# Patient Record
Sex: Male | Born: 1945 | Race: White | Hispanic: No | Marital: Married | Smoking: Former smoker
Health system: Southern US, Community
[De-identification: ages and names within clinical notes are randomized; demographics above are authoritative.]

## PROBLEM LIST (undated history)

## (undated) DIAGNOSIS — Z973 Presence of spectacles and contact lenses: Secondary | ICD-10-CM

## (undated) DIAGNOSIS — D709 Neutropenia, unspecified: Principal | ICD-10-CM

## (undated) DIAGNOSIS — IMO0002 Reserved for concepts with insufficient information to code with codable children: Secondary | ICD-10-CM

## (undated) DIAGNOSIS — I219 Acute myocardial infarction, unspecified: Secondary | ICD-10-CM

## (undated) DIAGNOSIS — D472 Monoclonal gammopathy: Secondary | ICD-10-CM

## (undated) DIAGNOSIS — Z974 Presence of external hearing-aid: Secondary | ICD-10-CM

## (undated) DIAGNOSIS — I10 Essential (primary) hypertension: Secondary | ICD-10-CM

## (undated) DIAGNOSIS — E785 Hyperlipidemia, unspecified: Secondary | ICD-10-CM

## (undated) DIAGNOSIS — C8590 Non-Hodgkin lymphoma, unspecified, unspecified site: Secondary | ICD-10-CM

## (undated) DIAGNOSIS — C61 Malignant neoplasm of prostate: Secondary | ICD-10-CM

## (undated) DIAGNOSIS — D72819 Decreased white blood cell count, unspecified: Secondary | ICD-10-CM

## (undated) DIAGNOSIS — I2510 Atherosclerotic heart disease of native coronary artery without angina pectoris: Secondary | ICD-10-CM

## (undated) HISTORY — DX: Neutropenia, unspecified: D70.9

## (undated) HISTORY — PX: WISDOM TOOTH EXTRACTION: SHX21

## (undated) HISTORY — DX: Reserved for concepts with insufficient information to code with codable children: IMO0002

## (undated) HISTORY — PX: FINGER AMPUTATION: SHX636

## (undated) HISTORY — DX: Monoclonal gammopathy: D47.2

## (undated) HISTORY — DX: Hyperlipidemia, unspecified: E78.5

## (undated) HISTORY — PX: REVISION AMPUTATION OF FINGER: SHX2346

## (undated) HISTORY — DX: Decreased white blood cell count, unspecified: D72.819

## (undated) HISTORY — DX: Acute myocardial infarction, unspecified: I21.9

## (undated) HISTORY — PX: VASECTOMY: SHX75

---

## 2001-08-25 ENCOUNTER — Encounter: Payer: Self-pay | Admitting: Family Medicine

## 2001-08-25 ENCOUNTER — Ambulatory Visit (HOSPITAL_COMMUNITY): Admission: RE | Admit: 2001-08-25 | Discharge: 2001-08-25 | Payer: Self-pay | Admitting: Family Medicine

## 2001-09-06 ENCOUNTER — Encounter: Payer: Self-pay | Admitting: Emergency Medicine

## 2001-09-06 ENCOUNTER — Inpatient Hospital Stay (HOSPITAL_COMMUNITY): Admission: EM | Admit: 2001-09-06 | Discharge: 2001-09-11 | Payer: Self-pay | Admitting: Emergency Medicine

## 2001-09-06 DIAGNOSIS — I252 Old myocardial infarction: Secondary | ICD-10-CM

## 2001-09-06 DIAGNOSIS — Z955 Presence of coronary angioplasty implant and graft: Secondary | ICD-10-CM

## 2001-09-06 HISTORY — DX: Old myocardial infarction: I25.2

## 2001-09-06 HISTORY — PX: CARDIAC CATHETERIZATION: SHX172

## 2001-09-06 HISTORY — PX: CORONARY ANGIOPLASTY WITH STENT PLACEMENT: SHX49

## 2001-09-06 HISTORY — DX: Presence of coronary angioplasty implant and graft: Z95.5

## 2001-10-03 ENCOUNTER — Encounter: Admission: RE | Admit: 2001-10-03 | Discharge: 2002-01-01 | Payer: Self-pay | Admitting: Cardiology

## 2002-01-02 ENCOUNTER — Encounter (HOSPITAL_COMMUNITY): Admission: RE | Admit: 2002-01-02 | Discharge: 2002-02-17 | Payer: Self-pay | Admitting: Cardiology

## 2002-01-08 ENCOUNTER — Encounter: Payer: Self-pay | Admitting: Neurosurgery

## 2002-01-10 ENCOUNTER — Inpatient Hospital Stay (HOSPITAL_COMMUNITY): Admission: RE | Admit: 2002-01-10 | Discharge: 2002-01-10 | Payer: Self-pay | Admitting: Neurosurgery

## 2002-01-26 ENCOUNTER — Observation Stay (HOSPITAL_COMMUNITY): Admission: RE | Admit: 2002-01-26 | Discharge: 2002-01-27 | Payer: Self-pay | Admitting: Neurosurgery

## 2002-01-26 ENCOUNTER — Encounter: Payer: Self-pay | Admitting: Neurosurgery

## 2002-01-26 HISTORY — PX: ANTERIOR CERVICAL DISCECTOMY: SHX1160

## 2002-01-26 HISTORY — PX: ANTERIOR CERVICAL DECOMP/DISCECTOMY FUSION: SHX1161

## 2002-02-19 ENCOUNTER — Encounter (HOSPITAL_COMMUNITY): Admission: RE | Admit: 2002-02-19 | Discharge: 2002-05-20 | Payer: Self-pay | Admitting: Cardiology

## 2002-05-21 ENCOUNTER — Encounter (HOSPITAL_COMMUNITY): Admission: RE | Admit: 2002-05-21 | Discharge: 2002-08-19 | Payer: Self-pay | Admitting: Cardiology

## 2002-08-20 ENCOUNTER — Encounter (HOSPITAL_COMMUNITY): Admission: RE | Admit: 2002-08-20 | Discharge: 2002-10-20 | Payer: Self-pay | Admitting: Cardiology

## 2005-03-16 ENCOUNTER — Ambulatory Visit (HOSPITAL_COMMUNITY): Admission: RE | Admit: 2005-03-16 | Discharge: 2005-03-16 | Payer: Self-pay | Admitting: Gastroenterology

## 2008-12-06 ENCOUNTER — Ambulatory Visit: Payer: Self-pay | Admitting: Vascular Surgery

## 2008-12-06 ENCOUNTER — Emergency Department (HOSPITAL_COMMUNITY): Admission: EM | Admit: 2008-12-06 | Discharge: 2008-12-06 | Payer: Self-pay | Admitting: Emergency Medicine

## 2009-01-08 ENCOUNTER — Ambulatory Visit: Payer: Self-pay | Admitting: *Deleted

## 2009-01-08 ENCOUNTER — Ambulatory Visit (HOSPITAL_COMMUNITY): Admission: RE | Admit: 2009-01-08 | Discharge: 2009-01-08 | Payer: Self-pay | Admitting: *Deleted

## 2009-05-02 ENCOUNTER — Ambulatory Visit: Payer: Self-pay | Admitting: Oncology

## 2009-05-21 LAB — CBC WITH DIFFERENTIAL (CANCER CENTER ONLY)
BASO#: 0 10*3/uL (ref 0.0–0.2)
Eosinophils Absolute: 0.1 10*3/uL (ref 0.0–0.5)
HGB: 14.8 g/dL (ref 13.0–17.1)
LYMPH#: 0.8 10*3/uL — ABNORMAL LOW (ref 0.9–3.3)
MCH: 31.3 pg (ref 28.0–33.4)
MONO#: 0.4 10*3/uL (ref 0.1–0.9)
NEUT#: 1.6 10*3/uL (ref 1.5–6.5)
Platelets: 171 10*3/uL (ref 145–400)
RBC: 4.71 10*6/uL (ref 4.20–5.70)
WBC: 2.9 10*3/uL — ABNORMAL LOW (ref 4.0–10.0)

## 2009-05-21 LAB — CMP (CANCER CENTER ONLY)
ALT(SGPT): 31 U/L (ref 10–47)
AST: 37 U/L (ref 11–38)
Chloride: 100 mEq/L (ref 98–108)
Creat: 0.9 mg/dl (ref 0.6–1.2)
Total Bilirubin: 0.8 mg/dl (ref 0.20–1.60)

## 2009-05-21 LAB — MORPHOLOGY - CHCC SATELLITE
PLT EST ~~LOC~~: ADEQUATE
Platelet Morphology: NORMAL

## 2009-05-23 LAB — SPEP & IFE WITH QIG
Beta 2: 3.4 % (ref 3.2–6.5)
Beta Globulin: 5 % (ref 4.7–7.2)
IgA: 200 mg/dL (ref 68–378)
IgG (Immunoglobin G), Serum: 2460 mg/dL — ABNORMAL HIGH (ref 694–1618)

## 2009-05-23 LAB — KAPPA/LAMBDA LIGHT CHAINS: Kappa:Lambda Ratio: 1 (ref 0.26–1.65)

## 2009-05-23 LAB — LACTATE DEHYDROGENASE: LDH: 161 U/L (ref 94–250)

## 2009-05-27 LAB — UIFE/LIGHT CHAINS/TP QN, 24-HR UR
Alpha 1, Urine: DETECTED — AB
Alpha 2, Urine: DETECTED — AB
Free Kappa/Lambda Ratio: 12.13 ratio — ABNORMAL HIGH (ref 0.46–4.00)
Free Lambda Excretion/Day: 7.8 mg/d
Free Lt Chn Excr Rate: 94.6 mg/d
Total Protein, Urine-Ur/day: 114 mg/d (ref 10–140)
Total Protein, Urine: 5.7 mg/dL

## 2009-07-02 ENCOUNTER — Ambulatory Visit: Payer: Self-pay | Admitting: Oncology

## 2009-07-07 ENCOUNTER — Other Ambulatory Visit: Admission: RE | Admit: 2009-07-07 | Discharge: 2009-07-07 | Payer: Self-pay | Admitting: Oncology

## 2009-07-07 ENCOUNTER — Encounter: Payer: Self-pay | Admitting: Oncology

## 2009-07-07 LAB — CBC WITH DIFFERENTIAL (CANCER CENTER ONLY)
BASO#: 0 10*3/uL (ref 0.0–0.2)
BASO%: 0.4 % (ref 0.0–2.0)
EOS%: 4 % (ref 0.0–7.0)
HGB: 14.3 g/dL (ref 13.0–17.1)
LYMPH#: 0.6 10*3/uL — ABNORMAL LOW (ref 0.9–3.3)
MCHC: 35.7 g/dL (ref 32.0–35.9)
NEUT#: 1.7 10*3/uL (ref 1.5–6.5)
Platelets: 161 10*3/uL (ref 145–400)

## 2009-08-11 ENCOUNTER — Ambulatory Visit: Payer: Self-pay | Admitting: Oncology

## 2009-08-13 LAB — IRON AND TIBC
%SAT: 34 % (ref 20–55)
Iron: 103 ug/dL (ref 42–165)
TIBC: 302 ug/dL (ref 215–435)

## 2009-08-13 LAB — FERRITIN: Ferritin: 74 ng/mL (ref 22–322)

## 2010-08-28 ENCOUNTER — Ambulatory Visit: Payer: Self-pay | Admitting: Oncology

## 2010-09-03 LAB — RETICULOCYTES (CHCC)
ABS Retic: 46.2 10*3/uL (ref 19.0–186.0)
RBC.: 4.62 MIL/uL (ref 4.22–5.81)
Retic Ct Pct: 1 % (ref 0.4–3.1)

## 2010-09-03 LAB — CBC WITH DIFFERENTIAL (CANCER CENTER ONLY)
BASO#: 0 10*3/uL (ref 0.0–0.2)
BASO%: 0.6 % (ref 0.0–2.0)
EOS%: 2.6 % (ref 0.0–7.0)
Eosinophils Absolute: 0.1 10*3/uL (ref 0.0–0.5)
HCT: 42 % (ref 38.7–49.9)
HGB: 14.6 g/dL (ref 13.0–17.1)
LYMPH#: 0.6 10*3/uL — ABNORMAL LOW (ref 0.9–3.3)
LYMPH%: 20.4 % (ref 14.0–48.0)
MCH: 32.3 pg (ref 28.0–33.4)
MCHC: 34.7 g/dL (ref 32.0–35.9)
MCV: 93 fL (ref 82–98)
MONO#: 0.3 10*3/uL (ref 0.1–0.9)
MONO%: 10.7 % (ref 0.0–13.0)
NEUT#: 1.8 10*3/uL (ref 1.5–6.5)
NEUT%: 65.7 % (ref 40.0–80.0)
Platelets: 174 10*3/uL (ref 145–400)
RBC: 4.52 10*6/uL (ref 4.20–5.70)
RDW: 11.9 % (ref 10.5–14.6)
WBC: 2.8 10*3/uL — ABNORMAL LOW (ref 4.0–10.0)

## 2010-09-03 LAB — IRON AND TIBC
%SAT: 32 % (ref 20–55)
Iron: 91 ug/dL (ref 42–165)
TIBC: 283 ug/dL (ref 215–435)
UIBC: 192 ug/dL

## 2010-09-03 LAB — FERRITIN: Ferritin: 97 ng/mL (ref 22–322)

## 2010-10-14 ENCOUNTER — Ambulatory Visit: Payer: Self-pay | Admitting: Cardiology

## 2011-03-28 LAB — TISSUE HYBRIDIZATION (BONE MARROW)-NCBH

## 2011-04-05 LAB — POCT I-STAT, CHEM 8
BUN: 17 mg/dL (ref 6–23)
Calcium, Ion: 0.99 mmol/L — ABNORMAL LOW (ref 1.12–1.32)
Chloride: 107 mEq/L (ref 96–112)

## 2011-05-03 ENCOUNTER — Telehealth: Payer: Self-pay | Admitting: *Deleted

## 2011-05-03 NOTE — Telephone Encounter (Signed)
Pt called, c/o hives all over his torso and arms.  Pt feels like the Niaspan is causing the new symptoms.  Pt had hives and then stopped Niaspan due to running out of the medication and the hives went away, when pt started the Niaspan back the hives returned.  RN advised pt to stop Niaspan permanently and continue to work on diet and exercise.  RN informed pt to call back if symptoms do not improve.

## 2011-05-04 NOTE — Op Note (Signed)
NAMEDONNY, HEFFERN             ACCOUNT NO.:  [id number]   MEDICAL RECORD NO.:  [id number]          PATIENT TYPE:  AMB   LOCATION:  SDS                          FACILITY:  [facility]   PHYSICIAN:  Balinda Quails, M.D.    DATE OF BIRTH:  12-01-1946   DATE OF PROCEDURE:  01/08/2009  DATE OF DISCHARGE:                               OPERATIVE REPORT   PHYSICIAN:  Balinda Quails, MD   DIAGNOSIS:  Atheroemboli left foot.   PROCEDURE:  Abdominal aortogram with bilateral lower extremity runoff  arteriography.   ACCESS:  Right common femoral artery, 5-French sheath.   CONTRAST:  100 mL Visipaque.   COMPLICATIONS:  None apparent.   CLINICAL NOTE:  Eugene Moreno is a 65 year old male with a history of  coronary artery disease and a remote history of tobacco use.  He has had  a coronary stent placed in 2002.  Presented to the emergency department  with evidence of an atheroembolic episode to his left foot.  Brought to  the Cath Lab at this time for diagnostic workup with arteriography.   PROCEDURE NOTE:  The patient brought to the Cath Lab in stable  condition.  Placed in supine position.  Both groins prepped and draped  in a sterile fashion.   Skin and subcutaneous tissue of right groin was instilled with 1%  Xylocaine.  An 18-gauge needle introduced into the right common femoral  artery.  A 0.035 guidewire advanced through the needle into the mid  abdominal aorta.  A 5-French sheath advanced over the guidewire.   Pigtail catheter advanced over the guidewire to the suprarenal aorta.   Standard AP mid abdominal aortogram obtained.  This revealed normal  renal arteries, which were widely patent.  The infrarenal aorta was free  of significant atherosclerotic disease.  Widely patent.  The common  iliac arteries were normal without significant plaque.   The pigtail catheter brought down to the aortic bifurcation.  Bilateral  lower extremity runoff arteriography obtained.  The lower  extremities  revealed no evidence of significant atherosclerotic disease.  The  external iliacs, common femoral, profunda femoris, superficial femoral,  popliteal and tibial arteries were patent bilaterally.  No significant  atherosclerotic peripheral vascular disease identified.   This completed the arteriogram procedure.  The guidewire re-inserted.  Right femoral sheath removed.  No apparent complications.   FINAL IMPRESSION:  Normal abdominal aortogram with normal lower  extremity runoff arteriography.      Balinda Quails, M.D.  Electronically Signed     PGH/MEDQ  D:  01/08/2009  T:  01/08/2009  Job:  [id number]   cc:   Colleen Can. Deborah Chalk, M.D.  Larina Earthly, M.D.

## 2011-05-07 NOTE — Discharge Summary (Signed)
[place]. [facility]  Patient:    COYT, GOVONI Visit Number: [id number] MRN: [id number]          Service Type: MED Location: 2000 2038 01 Attending Physician:  Eleanora Neighbor Dictated by:   Jennet Maduro Earl Gala, R.N., A.N.P. Admit Date:  09/06/2001 Discharge Date: 09/11/2001   CC:         Chales Salmon. Abigail Miyamoto, M.D.  Dr. Jeral Fruit   Discharge Summary  PRIMARY DISCHARGE DIAGNOSIS:  Acute anterior myocardial infarction with emergent coronary angiography and subsequent stent placement with a 3.5 x 23 mm Penta stent to the left anterior descending.  SECONDARY DISCHARGE DIAGNOSIS: Herniated cervical disk.  HISTORY OF PRESENT ILLNESS:  The patient is a very pleasant 65 year old white male who has really had no past significant medical history.  He has recently suffered from a herniated cervical disk and has actually had surgery planned on Friday September 20.  He has been taking Tylenol and Valium for that discomfort.  He notes that on the night prior to admission he developed severe substernal chest pain at approximately 10 p.m. while taking a shower.  He took a dose of Tylox and Valium and drank some gingerale and thought the discomfort was more of an indegestion-like feeling, but it really provided no relief.  He subsequently became sweaty and clammy.  He proceeded onto the emergency room where a 12-lead electrocardiogram showed acute myocardial infarction.  He was subsequently admitted for further evaluation.  Please see the dictated history and physical per Dr. Roger Shelter for further patient presentation and profile.  LABORATORY DATA:  On admission, 12-lead electrocardiogram showing ST elevation in lead AVL as well as B2.  Chest x-ray was negative.  Hematocrit 43, white count 4.8.  Chemistries were satisfactory.  HOSPITAL COURSE:  The patient was admitted.  He was taken emergently to the coronary angiography lab to undergo cardiac  catheterization per Dr. Roger Shelter. The overall procedure was tolerated well without any known complications.  The left main was normal, The LAD demonstrated 100% proximal narrowing, and the LAD filled by partial collaterals via the right coronary. The left circumflex has a large obtuse marginal branch and is normal.  The right coronary artery is a large dominant vessel which gives right to left collaterals.  A 23 mm x 3.5 Penta stent was subsequently placed to the left anterior descending.  Excellent TIMI grade 3 flow was obtained with no residual stenosis.  He was placed on Plavix IV and Integrilin and subsequently placed to the coronary care unit for further patient presentation and profile.   Throughout the remainder of his hospitalization, he has progressed quite well. He has had no recurrence of chest pain.  His neck discomfort has been basically stable with p.r.n. Percocet.  His overall physical exam has been unremarkable.  He had a transient drop in his white blood cell count for uncertain reasons, but back up to 3.6 as of 09/08/01.  He has had cholesterol panels drawn which show a total cholesterol of 123, triglycerides 192, HDL 28, and LDL of 57.  Today on 09/11/01, he is doing well.  He has had no recurrence of chest pain. He is ambulating with cardiac rehab.  His neck pain has been basically stable and has actually diminished somewhat on a decreased use amount of narcotics. His physical exam is unremarkable, and he is felt to be a stable candidate for discharge today.  DISCHARGE CONDITION:  Stable.  DISCHARGE MEDICATIONS: 1. Plavix 75 mg  daily for the next 3 weeks. 2. Aspirin daily to be taken along with the Plavix. 3. Altace 2.5 mg daily. 4. Percocet as needed with prescription provided by Dr. Roger Shelter for    #40. 5. Nitroglycerin p.r.n. chest pain. 6. He may resume his vitamins as taken before.  ACTIVITY:  To be light.  He may walk 5 to 10 minutes two  times a day.  DIET:  Low fat.  WOUND CARE:  He is to place an ice pack to the groin if needed.  SPECIAL INSTRUCTIONS:  He is not to engage in any type of sexual intercourse until seen back in follow up.  He was also asked not to drive.  FOLLOW UP APPOINTMENT:  Needs to be in our office in approximately 10 days with the Nurse Practitioner.  He is to call to schedule that later on this afternoon.  We have also asked him to follow up with Dr. Jeral Fruit in the next 10 days. Dictated by:   Jennet Maduro Earl Gala, R.N., A.N.P. Attending Physician:  Eleanora Neighbor DD:  09/11/01 TD:  09/11/01 Job: [id number]

## 2011-05-07 NOTE — H&P (Signed)
[place]. [facility]  Patient:    YOUNG, MULVEY Visit Number: [id number] MRN: [id number]          Service Type: MED Location: [facility] 2922 01 Attending Physician:  Eleanora Neighbor Dictated by:   Colleen Can. Deborah Chalk, M.D. Admit Date:  09/06/2001   CC:         Chales Salmon. Abigail Miyamoto, M.D.  Tanya Nones. Jeral Fruit, M.D.   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  He is married, a nonsmoker, and rarely uses alcohol.  CHIEF COMPLAINT/HISTORY OF PRESENT ILLNESS: He developed severe substernal chest pain at approximately 10 p.m. tonight while taking a shower.  He has been having cervical neck problems and took Tylox and Valium.  He drank some ginger ale.  He thought the discomfort was an indigestion like feeling but it really provided no relief.  He later became somewhat sweaty and clammy but not really short of breath throughout the whole ordeal.  EMS was called at approximately midnight.  He has had no pain that has really radiated. Initially it was somewhat a burning mid chest pain and then began to radiate more to just left of his sternum.  It did not radiate to his arm.  He currently rated the pain as 7/10.  FAMILY HISTORY: Father had his first myocardial infarction at age 105.  He had two more myocardial infarctions before dying at age 31.  His mother had no history of heart trouble and died at age 76 of breast cancer.  He has five siblings.  He is second in the birth order and all other siblings are basically alive and well.  PAST MEDICAL HISTORY:  1. Remarkable only for herniated disk in his neck and he is actually     scheduled for surgery in two days and has been off chronic aspirin that he     takes.  2. Surgery on a wisdom tooth.  3. Traumatic amputation of little finger.  4. Vasectomy.  ALLERGIES: None.  CURRENT MEDICATIONS:  1. Aspirin.  2. Currently on Tylox.  3.  Valium.  REVIEW OF SYSTEMS: HEENT: Unremarkable except for his neck problems.  He had a similar episode to this initial event approximately a week ago but had syncope and was seen and evaluated.  He actually had carotid Dopplers, which were negative.  GI/GU/MUSCULOSKELETAL: All basically unremarkable.  There is a history of autoimmune disorder in the family although the patient has not had any of that.  His father had Dresslers syndrome with his first myocardial infarction.  PHYSICAL EXAMINATION:  GENERAL: He is a pleasant white male.  He is obese.  VITAL SIGNS: Blood pressure 110/70, heart rate 80s.  HEENT: Negative.  CHEST: Lungs clear.  HEART: No gallop.  ABDOMEN: Soft, somewhat obese.  No organomegaly.  No masses.  LYMPH NODES: Not palpable in the neck or groin.  EXTREMITIES: Peripheral pulses intact.  Extremities were without edema.  There is traumatic amputation of the little finger.  SKIN: Warm and dry.  Color somewhat pale.  LABORATORY DATA: EKG showed ST elevation in aVL and V2 compatible with lateral myocardial infarction.  Chest x-ray negative.  He did have abnormal cardiac enzymes.  BUN was 18, potassium 3.7.  Hematocrit 45.  Other laboratories pending.  OVERALL IMPRESSION: Acute anterior myocardial infarction.  PLAN: Proceed on with emergent catheterization. Dictated by:   Colleen Can Deborah Chalk, M.D. Attending Physician:  Eleanora Neighbor DD:  09/06/01 TD:  09/06/01 Job: [id number]

## 2011-05-07 NOTE — Op Note (Signed)
[place]. [facility]  Patient:    GLEASON, ARDOIN Visit Number: [id number] MRN: [id number]          Service Type: SUR Location: 3000 3027 01 Attending Physician:  Danella Penton Dictated by:   Tanya Nones. Jeral Fruit, M.D. Proc. Date: 01/26/02 Admit Date:  01/26/2002 Discharge Date: 01/27/2002   CC:         Laurita Quint, M.D. 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  PROCEDURE:  Anterior C6-7 diskectomy, decompression of the C7 nerve root, bone graft, plate.  Microscope.  SURGEON:  Tanya Nones. Jeral Fruit, M.D.  ASSISTANTMena Goes. Franky Macho, M.D.  CLINICAL HISTORY:  Mr. Carden is a 65 year old gentleman who was seen by me back in September 2002, because of neck and right upper extremity pain.  At that time he had weakness of the right triceps, with atrophy.  Unfortunately, he had an MI which was taken care of.  Now he comes back to have his surgery. We scheduled him for two weeks ago but his white cells were below 2.5.  He has been seen since then by the hematology which gave him the green light for surgery.  The risks were explained to him on history and physical.  DESCRIPTION OF PROCEDURE:  The patient was taken to the OR and the left side of the neck was prepped with Betadine.  Transverse incision was made and dissection was carried out all the way down to the cervical spine.  X-ray showed that, indeed, we were at the level of C6 and C7.  From then on, we removed the anterior osteophyte and the anterior ligament was opened.  We brought the microscope into the area and with the microcuret, we did a total gross diskectomy.  With a drill, without drilling the end plate, especially on the right side, we found that he has also spondylosis but once the spondylosis was removed, we found that he has some fragment of disk  compromising the C7 nerve root.  This C7 nerve root was a little bit pale secondary to the chronic compression.  Foraminotomy also on the left side was done.  Then the end plate was drilled and a bone graft of 8 mm height was inserted.  This was followed by a plate.  Lateral C-spine showed good position on the bone graft.  From then on, the area was irrigated. Investigation was negative and the wound was closed with Vicryl and a Steri-Strip. Dictated by:   Tanya Nones. Jeral Fruit, M.D. Attending Physician:  Danella Penton DD:  01/26/02 TD:  01/27/02 Job: [id number]

## 2011-05-07 NOTE — Cardiovascular Report (Signed)
[place]. [facility]  Patient:    Eugene Moreno, Eugene Moreno Visit Number: [id number] MRN: [id number]          Service Type: Attending:  Colleen Can. Deborah Chalk, M.D. Dictated by:   Colleen Can Deborah Chalk, M.D. Proc. Date: 09/06/01   CC:         Chales Salmon. Abigail Miyamoto, M.D.  Tanya Nones. Jeral Fruit, M.D.   Cardiac Catheterization  INDICATIONS: The patient is a 65 year old male, who presents with approximately 3-4 hours of substernal chest pain. It began with an indigestion-like feeling and became a steady pain just to the left of his sthernum. He had mild nausea. He also has a ruptured cervical disk over te last two weeks.  ECG showed lateral ST elevation and right bundle-branch block.  PROCEDURE: Left heart catheterization with selective coronary angiography, left ventricular angiography, and angioplasty of the left anterior descending with a stent placed in the proximal left anterior descending.  TYPE AND SITE OF ENTRY: Percutaneous right femoral artery.  CATHETERS: A 6 French 4 curved Judkins right and left coronary catheters, 6 French pigtail ventriculographic catheter, 7 [place] guide catheter, a Hi-Torque Floppy guide wire, initially a 3.0 x 23 mm Maverick balloon and subsequently a 23 x 3.5 mm Penta stent.  CONTRAST MATERIAL: Omnipaque.  MEDICATIONS GIVEN PRIOR TO THE PROCEDURE: Heparin and IV nitroglycerin.  MEDICATIONS GIVEN DURING THE PROCEDURE: Integrilin, fentanyl 25 mcg, and 2 mg of Versed.  COMMENTS: The patient tolerated the procedure well.  HEMODYNAMIC DATA: The aortic pressure was 103/72 and LV is 100/27.  There was no aortic valve gradient noted on pullback. The LV gram was done after the angioplasty procedure.  ANGIOGRAPHIC DATA: 1. Left main coronary artery:  Left main coronary artery is normal. 2. Intermediate coronary. There is a small intermediate coronary    artery which is essentially normal. 3. Left circumflex:  The left circumflex is a  reasonably large    obtuse marginal configuration. It is normal. 4. Right coronary artery: The right coronary artery is a dominant vessel.    It is large.  It supplies collaterals to the left anterior descending. 5. Left anterior descending: The left anterior descending is totally occluded   approximately 1.5 cm from the ostium.  LEFT VENTRICULOGRAPHY: The left ventricular angiogram was performed after the percutaneous intervention.  The left ventricular angiogram showed a overall reasonably well preserved global left ventricular function.  There was akinesis of the anterior wall including the apex.  The LV had somewhat of a slipper-type appearance.  There is no mitral regurgitation, intracardiac calcification or intracavitary filling defect.  PERCUTANEOUS INTERVENTION: We exchanged for a 7 [place] JL4 guide. The Hi-Torque Floppy guide wire was positioned across the occluded vessel. Initially, crossed with a 3.0 x 20 mm length Maverick balloon and inflated to a maximum of 5 atmospheres.  This resulted in satisfactory reperfusion. There is long irregular segment.  Then returned with a 3.5 x 20 mm Penta stent. This was inflated to a maximum of 9 atmospheres initially.  The initial angiographic evaluation suggested some distal narrowing and we returned with a balloon and inflated distally to 12 atmospheres resulting in excellent flow. The left anterior descending had a large second diagonal branch, but the left anterior descending itself ended prior to the apex.  The final angiographic result was felt to be excellent.  OVERALL IMPRESSION: 1. Acute anterior myocardial infarction. 2. Totally occluded left anterior descending. 3. Minimal coronary atherosclerosis otherwise with a large dominant    right coronary  artery. 4. Successful stent placement in the proximal left anterior descending with    TIMI grade 3 flow. Dictated by:   Colleen Can Deborah Chalk, M.D. Attending:  Colleen Can. Deborah Chalk,  M.D. DD:  09/06/01 TD:  09/06/01 Job: [id number]

## 2011-05-07 NOTE — Op Note (Signed)
NAMEKURT, HOFFMEIER NO.:  [id number]   MEDICAL RECORD NO.:  [id number]          PATIENT TYPE:  AMB   LOCATION:  ENDO                         FACILITY:  [facility]   PHYSICIAN:  Graylin Shiver, M.D.   DATE OF BIRTH:  03/04/1946   DATE OF PROCEDURE:  03/16/2005  DATE OF DISCHARGE:                                 OPERATIVE REPORT   PROCEDURE:  Colonoscopy.   INDICATIONS:  Screening.   Informed consent was obtained after explanation of the risks of bleeding,  infection, and perforation.   PREMEDICATIONS:  Fentanyl 70 mcg IV, Versed 7 mg IV.   PROCEDURE:  With the patient in the left lateral decubitus position, a  rectal exam was performed.  No masses were felt.  The Olympus colonoscope  was inserted into the rectum and advanced around the colon to the cecum.  Cecal landmarks were identified.  The cecum and ascending colon were normal.  The transverse colon normal.  The descending colon and sigmoid revealed  diverticulosis.  The rectum was normal.  He tolerated the procedure well  without complications.   IMPRESSION:  Diverticulosis.   PLAN:  I would recommend a follow-up screening colonoscopy again in 10  years.      SFG/MEDQ  D:  03/16/2005  T:  03/16/2005  Job:  [id number]   cc:   Chales Salmon. Abigail Miyamoto, M.D.  [address]  [place]  [place] [id number]  Fax: [phone]

## 2011-05-07 NOTE — H&P (Signed)
[place]. [facility]  Patient:    Eugene Moreno, Eugene Moreno Visit Number: [id number] MRN: [id number]          Service Type: SUR Location: [facility] 3172 04 Attending Physician:  Danella Penton Dictated by:   Tanya Nones. Jeral Fruit, M.D. Admit Date:  01/26/2002                           History and Physical  CHIEF COMPLAINT: This is a gentleman who was seen by me initially about five months ago because of neck pain with radiation down to the right shoulder and then to the right arm, associated with weakness and atrophy of the right triceps.  HISTORY OF PRESENT ILLNESS: The patient was treated with conservative treatment, and he was scheduled to have surgery.  Unfortunately, he had a myocardial infarction.  Now, he is cardiovascularly stable and he is ready to be taken to surgery.  We brought him to have surgery several weeks ago, but we found that his WBC was below 2.  Since then he had been seen by hematology, who feels that he is ready to go ahead with surgery, and he is being admitted today for anterior cervical diskectomy at the level of C6-C7.  PAST MEDICAL HISTORY:  1. History of oral surgery.  2. History of MI.  SOCIAL HISTORY: The patient does not smoke.  He drinks socially.  FAMILY HISTORY: Unremarkable.  REVIEW OF SYSTEMS: Positive for neck pain, arm pain.  PHYSICAL EXAMINATION:  GENERAL: The patient came to my office and, indeed, he has no pain but quite a bit of atrophy and weakness of the right triceps and right pectoralis major.  HEENT: Normal.  NECK: He is able to flex and extend with minimal discomfort.  CHEST: Lungs clear.  CARDIAC: Heart sounds normal.  ABDOMEN: Normal.  EXTREMITIES: Normal pulses.  NEUROLOGIC: Mental status normal.  Cranial nerves normal.  Strength 5/5 except in the right triceps, which is 1/5, with atrophy of the right triceps and the right pectoralis major.  Reflexes present with absent right  triceps. Coordination normal.  Sensation normal.  LABORATORY DATA: MRI shows herniated disk at the level of C6-C7.  CLINICAL IMPRESSION: C6-C7 herniated disk.  PLAN: The patient is being admitted for anterior cervical diskectomy at the level of C6-C7 followed with a bone graft and plate.  He knows about the risks such as infection, CSF leak, worsening of pain, paralysis, damage to organs, and all the risks associated with his history of MI. Dictated by:   Tanya Nones. Jeral Fruit, M.D. Attending Physician:  Danella Penton DD:  01/26/02 TD:  01/26/02 Job: [phone]

## 2011-09-16 ENCOUNTER — Other Ambulatory Visit: Payer: Self-pay | Admitting: Oncology

## 2011-09-16 ENCOUNTER — Encounter (HOSPITAL_BASED_OUTPATIENT_CLINIC_OR_DEPARTMENT_OTHER): Payer: 59 | Admitting: Oncology

## 2011-09-16 DIAGNOSIS — D72810 Lymphocytopenia: Secondary | ICD-10-CM

## 2011-09-16 DIAGNOSIS — D72819 Decreased white blood cell count, unspecified: Secondary | ICD-10-CM

## 2011-09-16 DIAGNOSIS — D472 Monoclonal gammopathy: Secondary | ICD-10-CM

## 2011-09-16 LAB — CBC WITH DIFFERENTIAL/PLATELET
Basophils Absolute: 0 10*3/uL (ref 0.0–0.1)
Eosinophils Absolute: 0.1 10*3/uL (ref 0.0–0.5)
HCT: 43 % (ref 38.4–49.9)
HGB: 15 g/dL (ref 13.0–17.1)
LYMPH%: 22.3 % (ref 14.0–49.0)
MCV: 92.7 fL (ref 79.3–98.0)
MONO#: 0.5 10*3/uL (ref 0.1–0.9)
MONO%: 16.1 % — ABNORMAL HIGH (ref 0.0–14.0)
NEUT#: 1.9 10*3/uL (ref 1.5–6.5)
Platelets: 155 10*3/uL (ref 140–400)
WBC: 3.2 10*3/uL — ABNORMAL LOW (ref 4.0–10.3)

## 2011-09-23 LAB — SPEP & IFE WITH QIG
Alpha-1-Globulin: 3.7 % (ref 2.9–4.9)
Alpha-2-Globulin: 8.5 % (ref 7.1–11.8)
Beta Globulin: 5.1 % (ref 4.7–7.2)
Gamma Globulin: 26.5 % — ABNORMAL HIGH (ref 11.1–18.8)
IgG (Immunoglobin G), Serum: 2760 mg/dL — ABNORMAL HIGH (ref 650–1600)

## 2011-09-23 LAB — COMPREHENSIVE METABOLIC PANEL
Albumin: 4.4 g/dL (ref 3.5–5.2)
BUN: 13 mg/dL (ref 6–23)
CO2: 25 mEq/L (ref 19–32)
Glucose, Bld: 66 mg/dL — ABNORMAL LOW (ref 70–99)
Sodium: 140 mEq/L (ref 135–145)
Total Bilirubin: 0.5 mg/dL (ref 0.3–1.2)
Total Protein: 8.1 g/dL (ref 6.0–8.3)

## 2011-09-23 LAB — KAPPA/LAMBDA LIGHT CHAINS
Kappa free light chain: 2.86 mg/dL — ABNORMAL HIGH (ref 0.33–1.94)
Kappa:Lambda Ratio: 1.08 (ref 0.26–1.65)
Lambda Free Lght Chn: 2.65 mg/dL — ABNORMAL HIGH (ref 0.57–2.63)

## 2011-09-23 LAB — SEDIMENTATION RATE: Sed Rate: 11 mm/hr (ref 0–16)

## 2011-10-12 ENCOUNTER — Encounter: Payer: Self-pay | Admitting: *Deleted

## 2011-10-18 ENCOUNTER — Encounter: Payer: Self-pay | Admitting: *Deleted

## 2011-10-18 ENCOUNTER — Ambulatory Visit (INDEPENDENT_AMBULATORY_CARE_PROVIDER_SITE_OTHER): Payer: 59 | Admitting: Cardiology

## 2011-10-18 DIAGNOSIS — E7800 Pure hypercholesterolemia, unspecified: Secondary | ICD-10-CM

## 2011-10-18 DIAGNOSIS — I2510 Atherosclerotic heart disease of native coronary artery without angina pectoris: Secondary | ICD-10-CM | POA: Insufficient documentation

## 2011-10-18 DIAGNOSIS — E785 Hyperlipidemia, unspecified: Secondary | ICD-10-CM

## 2011-10-18 MED ORDER — RAMIPRIL 2.5 MG PO CAPS: 2.5000 mg | ORAL_CAPSULE | Freq: Every day | ORAL | Status: DC

## 2011-10-18 NOTE — Assessment & Plan Note (Signed)
Check lipids/LFTs with goal LDL < 70.  

## 2011-10-18 NOTE — Progress Notes (Signed)
PCP: Dr. Zachery Dauer  65 yo with history of CAD s/p anterior MI in 9/02 presents for cardiology evaluation.  He has been seen by Dr. Deborah Chalk in the past and is seen by me for the first time today.  Last nuclear stress test in 2010 showed no evidence for ischemia. He has been doing well symptomatically.  He walks for 4 miles/day on his treadmill.  No exertional dyspnea or chest pain. Symptom with his MI was very severe "indigestion."  He was on Niaspan in the past but was unable to tolerate it.    ECG: NSR at 53, old ASMI, LAFB  PMH: 1. CAD: Anterior MI in 9/02 with PCI to LAD.  Cardiolite in 2010 with EF 54%, minimal anterior hypokinesis, no ischemia.  2. 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  4. Hyperlipidemia 5. 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  Nonsmoker.  Rare ETOH.  Married, lives in [place].   FH: Father with MI at 71, sister with angioplasty at 31.   ROS: All systems reviewed and negative except as per HPI.   Current Outpatient Prescriptions  Medication Sig Dispense Refill  . cholecalciferol (VITAMIN D) 400 UNITS TABS Take 400 Units by mouth daily.        Marland Kitchen Cod Liver Oil CAPS Take by mouth daily.        . fexofenadine (ALLEGRA) 180 MG tablet Take 180 mg by mouth daily.        . folic acid (FOLVITE) 800 MCG tablet Take 400 mcg by mouth daily.        . rosuvastatin (CRESTOR) 5 MG tablet Take 5 mg by mouth daily.        . vitamin C (ASCORBIC ACID) 500 MG tablet Take 500 mg by mouth daily.        Marland Kitchen DISCONTD: aspirin 325 MG tablet Take 325 mg by mouth daily.        Marland Kitchen aspirin EC 81 MG tablet Take 2 tablets (162 mg total) by mouth daily.      . ramipril (ALTACE) 2.5 MG capsule Take 1 capsule (2.5 mg total) by mouth daily.  90 capsule  3    BP 128/84  Pulse 55  Ht 5\' 11"  (1.803 m)  Wt 194 lb (87.998 kg)  BMI 27.06 kg/m2 General: NAD Neck: No JVD, no thyromegaly or thyroid nodule.  Lungs: Clear to  auscultation bilaterally with normal respiratory effort. CV: Nondisplaced PMI.  Heart regular S1/S2, no S3/S4, no murmur.  No peripheral edema.  No carotid bruit.  Normal pedal pulses.  Abdomen: Soft, nontender, no hepatosplenomegaly, no distention.  Neurologic: Alert and oriented x 3.  Psych: Normal affect. Extremities: No clubbing or cyanosis.

## 2011-10-18 NOTE — Patient Instructions (Signed)
Start ramipril 2.5mg  daily.  Decrease aspirin to 162mg  daily--this will be two 81mg  aspirin daily. It should be enteric coated.  Your physician recommends that you return for a FASTING lipid profile / BMET 414.01 272.0 in about 3 weeks.   Your physician recommends that you return for a FASTING lipid profile /liver profile 414.01  272.0 ---6 months after the lab in about 3 weeks.   Your physician wants you to follow-up in: 1 year with Dr Shirlee Latch. (October 2013). You should receive a letter about 2 months in advance. Call our office at [phone] if you do not get a letter.

## 2011-10-18 NOTE — Assessment & Plan Note (Signed)
Stable with no ischemic symptoms.  He will continue Crestor.  Decrease ASA to 162 mg daily.  He will start ramipril 2.5 mg daily for secondary prevention.  BMET in 2 weeks.

## 2011-10-19 NOTE — Progress Notes (Signed)
Addended by: Micki Riley C on: 10/19/2011 03:57 PM   Modules accepted: Orders

## 2011-10-25 ENCOUNTER — Telehealth: Payer: Self-pay | Admitting: Cardiology

## 2011-10-25 MED ORDER — ROSUVASTATIN CALCIUM 5 MG PO TABS: 5.0000 mg | ORAL_TABLET | Freq: Every day | ORAL | Status: DC

## 2011-10-25 NOTE — Telephone Encounter (Signed)
Refill crestor 5 mg, uses [facility] 90 day supply

## 2011-10-26 LAB — COMPREHENSIVE METABOLIC PANEL
AST: 28 U/L (ref 0–37)
Albumin: 4.4 g/dL (ref 3.5–5.2)
BUN: 13 mg/dL (ref 6–23)
Calcium: 9.5 mg/dL (ref 8.4–10.5)
Chloride: 106 mEq/L (ref 96–112)
Glucose, Bld: 66 mg/dL — ABNORMAL LOW (ref 70–99)
Potassium: 4.6 mEq/L (ref 3.5–5.3)
Sodium: 140 mEq/L (ref 135–145)
Total Protein: 8.1 g/dL (ref 6.0–8.3)

## 2011-11-08 ENCOUNTER — Other Ambulatory Visit: Payer: 59 | Admitting: *Deleted

## 2011-11-17 ENCOUNTER — Other Ambulatory Visit (INDEPENDENT_AMBULATORY_CARE_PROVIDER_SITE_OTHER): Payer: 59 | Admitting: *Deleted

## 2011-11-17 DIAGNOSIS — E7800 Pure hypercholesterolemia, unspecified: Secondary | ICD-10-CM

## 2011-11-17 DIAGNOSIS — I2510 Atherosclerotic heart disease of native coronary artery without angina pectoris: Secondary | ICD-10-CM

## 2011-11-17 LAB — HEPATIC FUNCTION PANEL
AST: 35 U/L (ref 0–37)
Albumin: 4.5 g/dL (ref 3.5–5.2)
Alkaline Phosphatase: 69 U/L (ref 39–117)
Total Protein: 8.6 g/dL — ABNORMAL HIGH (ref 6.0–8.3)

## 2011-11-17 LAB — BASIC METABOLIC PANEL
BUN: 16 mg/dL (ref 6–23)
CO2: 26 mEq/L (ref 19–32)
Chloride: 103 mEq/L (ref 96–112)
Creatinine, Ser: 1 mg/dL (ref 0.4–1.5)
Potassium: 4.4 mEq/L (ref 3.5–5.1)

## 2011-11-17 LAB — LIPID PANEL
Cholesterol: 109 mg/dL (ref 0–200)
HDL: 37.7 mg/dL — ABNORMAL LOW (ref >39.00)
Triglycerides: 61 mg/dL (ref 0.0–149.0)

## 2011-11-23 ENCOUNTER — Telehealth: Payer: Self-pay | Admitting: *Deleted

## 2011-11-23 NOTE — Telephone Encounter (Signed)
left voice message to inform the patient of the new date and time for his appointment on 09-14-2012 starting at 9:30am

## 2012-05-05 ENCOUNTER — Other Ambulatory Visit: Payer: 59

## 2012-06-08 ENCOUNTER — Ambulatory Visit (INDEPENDENT_AMBULATORY_CARE_PROVIDER_SITE_OTHER): Payer: Medicare Other | Admitting: *Deleted

## 2012-06-08 DIAGNOSIS — E785 Hyperlipidemia, unspecified: Secondary | ICD-10-CM

## 2012-06-08 LAB — HEPATIC FUNCTION PANEL
Albumin: 4.3 g/dL (ref 3.5–5.2)
Alkaline Phosphatase: 68 U/L (ref 39–117)
Bilirubin, Direct: 0.1 mg/dL (ref 0.0–0.3)
Total Protein: 8.2 g/dL (ref 6.0–8.3)

## 2012-06-08 LAB — LIPID PANEL
Cholesterol: 99 mg/dL (ref 0–200)
LDL Cholesterol: 45 mg/dL (ref 0–99)
Total CHOL/HDL Ratio: 3
Triglycerides: 99 mg/dL (ref 0.0–149.0)
VLDL: 19.8 mg/dL (ref 0.0–40.0)

## 2012-08-30 ENCOUNTER — Telehealth: Payer: Self-pay | Admitting: Cardiology

## 2012-08-30 MED ORDER — ROSUVASTATIN CALCIUM 5 MG PO TABS: 5.0000 mg | ORAL_TABLET | Freq: Every day | ORAL | Status: DC

## 2012-08-30 MED ORDER — RAMIPRIL 2.5 MG PO CAPS: 2.5000 mg | ORAL_CAPSULE | Freq: Every day | ORAL | Status: DC

## 2012-08-30 NOTE — Telephone Encounter (Signed)
Pt requesting refills of medication before going on a trip 10/03/12. Appt scheduled for pt with Dr Shirlee Latch 09/27/12.

## 2012-08-30 NOTE — Telephone Encounter (Signed)
Please return call to patient to discuss medication and extended vacation, he can be reached at [phone]

## 2012-09-13 ENCOUNTER — Other Ambulatory Visit: Payer: Self-pay | Admitting: Medical Oncology

## 2012-09-13 DIAGNOSIS — I2510 Atherosclerotic heart disease of native coronary artery without angina pectoris: Secondary | ICD-10-CM

## 2012-09-14 ENCOUNTER — Encounter: Payer: Self-pay | Admitting: Oncology

## 2012-09-14 ENCOUNTER — Other Ambulatory Visit (HOSPITAL_BASED_OUTPATIENT_CLINIC_OR_DEPARTMENT_OTHER): Payer: 59

## 2012-09-14 ENCOUNTER — Telehealth: Payer: Self-pay | Admitting: *Deleted

## 2012-09-14 ENCOUNTER — Ambulatory Visit (HOSPITAL_BASED_OUTPATIENT_CLINIC_OR_DEPARTMENT_OTHER): Payer: 59 | Admitting: Oncology

## 2012-09-14 VITALS — BP 115/79 | HR 72 | Temp 98.2°F | Resp 20 | Ht 71.0 in | Wt 192.2 lb

## 2012-09-14 DIAGNOSIS — D709 Neutropenia, unspecified: Secondary | ICD-10-CM

## 2012-09-14 DIAGNOSIS — D472 Monoclonal gammopathy: Secondary | ICD-10-CM

## 2012-09-14 DIAGNOSIS — I2510 Atherosclerotic heart disease of native coronary artery without angina pectoris: Secondary | ICD-10-CM

## 2012-09-14 HISTORY — DX: Neutropenia, unspecified: D70.9

## 2012-09-14 HISTORY — DX: Monoclonal gammopathy: D47.2

## 2012-09-14 LAB — CBC WITH DIFFERENTIAL/PLATELET
Basophils Absolute: 0 10*3/uL (ref 0.0–0.1)
Eosinophils Absolute: 0.1 10*3/uL (ref 0.0–0.5)
HCT: 43.6 % (ref 38.4–49.9)
HGB: 15 g/dL (ref 13.0–17.1)
MCH: 32 pg (ref 27.2–33.4)
NEUT#: 2 10*3/uL (ref 1.5–6.5)
NEUT%: 65.2 % (ref 39.0–75.0)
RDW: 13 % (ref 11.0–14.6)
lymph#: 0.5 10*3/uL — ABNORMAL LOW (ref 0.9–3.3)

## 2012-09-14 LAB — COMPREHENSIVE METABOLIC PANEL (CC13)
AST: 21 U/L (ref 5–34)
Albumin: 3.9 g/dL (ref 3.5–5.0)
BUN: 12 mg/dL (ref 7.0–26.0)
CO2: 22 mEq/L (ref 22–29)
Calcium: 9.7 mg/dL (ref 8.4–10.4)
Chloride: 108 mEq/L — ABNORMAL HIGH (ref 98–107)
Creatinine: 0.9 mg/dL (ref 0.7–1.3)
Glucose: 112 mg/dl — ABNORMAL HIGH (ref 70–99)
Potassium: 4.2 mEq/L (ref 3.5–5.1)

## 2012-09-14 NOTE — Patient Instructions (Addendum)
Doing well, counts look good  I will continue to see you every year

## 2012-09-14 NOTE — Telephone Encounter (Signed)
Gave patient appointment for 09-14-2013 starting at 9:00am

## 2012-09-14 NOTE — Progress Notes (Signed)
OFFICE PROGRESS NOTE  CC Dr. 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  CURRENT THERAPY: Observation  INTERVAL HISTORY: Eugene Moreno 66 y.o. male returns for followup visit. He is seen on a yearly basis. He had a he feels well denies any fevers chills night sweats headaches shortness of breath chest pains palpitations no recent infections. His white count is 3.0 he is not neutropenic. Remainder of the 10 point review of systems is negative.  MEDICAL HISTORY: Past Medical History  Diagnosis Date  . MI (myocardial infarction)     remote anterior, with last stress test 09/2010  . Herniated disc     history of  . Leukopenia     history of, with positive ANA, stable  . Hyperlipemia     tolerating crestor and niacin  . Neutropenia 09/14/2012    ALLERGIES:   has no known allergies.  MEDICATIONS:  Current Outpatient Prescriptions  Medication Sig Dispense Refill  . aspirin EC 81 MG tablet Take 2 tablets (162 mg total) by mouth daily.      Marland Kitchen Cod Liver Oil CAPS Take by mouth daily.        . fexofenadine (ALLEGRA) 180 MG tablet Take 180 mg by mouth daily.        . folic acid (FOLVITE) 800 MCG tablet Take 400 mcg by mouth daily.        . ramipril (ALTACE) 2.5 MG capsule Take 1 capsule (2.5 mg total) by mouth daily.  90 capsule  3  . rosuvastatin (CRESTOR) 5 MG tablet Take 1 tablet (5 mg total) by mouth daily.  90 tablet  3  . vitamin C (ASCORBIC ACID) 500 MG tablet Take 500 mg by mouth daily.        . cholecalciferol (VITAMIN D) 400 UNITS TABS Take 400 Units by mouth daily.          SURGICAL HISTORY:  Past Surgical History  Procedure Date  . Wisdom tooth extraction   . Anterior cervical discectomy 01-26-2002    C6-C7,  with a chronic C7 radiculopathy, decompression of the C7 nerve root, bone graft, plate.  Microscope   . Finger amputation     of "little finger",  traumatic   . Vasectomy   . Cardiac catheterization 09-06-2001    REVIEW OF SYSTEMS:  Pertinent items are noted in HPI.   PHYSICAL EXAMINATION:  General a well-developed nourished in no acute distress  HEENT exam EOMI PERRLA sclerae anicteric no conjunctival pallor oral mucosa is moist no thrush no mucositis neck is supple no palpable cervical supraclavicular or axillary adenopathy lungs are clear to auscultation and percussion cardiovascular is regular rate rhythm no murmurs gallops or rubs abdomen is soft nontender nondistended bowel sounds are present no HSM extremities no edema neuro patient's alert oriented otherwise nonfocal.  ECOG PERFORMANCE STATUS: 0 - Asymptomatic  Blood pressure 115/79, pulse 72, temperature 98.2 F (36.8 C), temperature source Oral, resp. rate 20, height 5\' 11"  (1.803 m), weight 192 lb 3.2 oz (87.181 kg).  LABORATORY DATA: Lab Results  Component Value Date   WBC 3.0* 09/14/2012   HGB 15.0 09/14/2012   HCT 43.6 09/14/2012   MCV 92.8 09/14/2012   PLT 146 09/14/2012      Chemistry      Component Value Date/Time   NA 137 11/17/2011 0944   NA 141 05/21/2009 1438   K 4.4 11/17/2011 0944  K 4.1 05/21/2009 1438   CL 103 11/17/2011 0944   CL 100 05/21/2009 1438   CO2 26 11/17/2011 0944   CO2 29 05/21/2009 1438   BUN 16 11/17/2011 0944   BUN 14 05/21/2009 1438   CREATININE 1.0 11/17/2011 0944   CREATININE 0.9 05/21/2009 1438      Component Value Date/Time   CALCIUM 9.4 11/17/2011 0944   CALCIUM 8.8 05/21/2009 1438   ALKPHOS 68 06/08/2012 0902   ALKPHOS 64 05/21/2009 1438   AST 25 06/08/2012 0902   AST 37 05/21/2009 1438   ALT 26 06/08/2012 0902   BILITOT 0.6 06/08/2012 0902   BILITOT 0.80 05/21/2009 1438       RADIOGRAPHIC STUDIES:  No results found.  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 He remains very stable    PLAN:  we will continue to follow him on a yearly basis. He continues to be seen by his  cardiologist.   All questions were answered. The patient knows to call the clinic with any problems, questions or concerns. We can certainly see the patient much sooner if necessary.  I spent 15 minutes counseling the patient face to face. The total time spent in the appointment was 30 minutes.    Drue Second, MD Medical/Oncology [facility] [phone] (beeper) [phone] (Office)  09/14/2012, 10:26 AM

## 2012-09-27 ENCOUNTER — Ambulatory Visit: Payer: 59 | Admitting: Cardiology

## 2012-10-04 ENCOUNTER — Ambulatory Visit (INDEPENDENT_AMBULATORY_CARE_PROVIDER_SITE_OTHER): Payer: Medicare Other | Admitting: Physician Assistant

## 2012-10-04 ENCOUNTER — Other Ambulatory Visit: Payer: Medicare Other

## 2012-10-04 ENCOUNTER — Encounter: Payer: Self-pay | Admitting: Physician Assistant

## 2012-10-04 VITALS — BP 100/70 | HR 63 | Ht 71.5 in | Wt 189.0 lb

## 2012-10-04 DIAGNOSIS — E785 Hyperlipidemia, unspecified: Secondary | ICD-10-CM

## 2012-10-04 DIAGNOSIS — I2510 Atherosclerotic heart disease of native coronary artery without angina pectoris: Secondary | ICD-10-CM

## 2012-10-04 NOTE — Patient Instructions (Signed)
Your physician wants you to follow-up in: 12 months with Dr. McLean  You will receive a reminder letter in the mail two months in advance. If you don't receive a letter, please call our office to schedule the follow-up appointment.  

## 2012-10-04 NOTE — Assessment & Plan Note (Signed)
Stable without chest pain. No further workup at this time. Followup in one year with Dr. Shirlee Latch

## 2012-10-04 NOTE — Assessment & Plan Note (Signed)
For blood work today

## 2012-10-04 NOTE — Progress Notes (Signed)
HPI:   This is a 66 year old white male patient who has history of coronary artery disease status post anterior wall MI and 9/02 treated with PCI to the LAD. He had a Cardiolite in 2010 ejection fraction 54% with minimal anterior hypokinesis no ischemia. He was last seen by Dr. Shirlee Latch on 10/18/11 which time he was doing well.  The patient is here for his yearly checkup. He denies any chest pain, palpitations, dyspnea, dyspnea on exertion, dizziness, or presyncope. He walks and hikes on a daily basis. He is getting ready to go on a month-long trip to [place], [place], and [place].   No Known Allergies  Current Outpatient Prescriptions on File Prior to Visit: aspirin EC 81 MG tablet, Take 2 tablets (162 mg total) by mouth daily., Disp: , Rfl:  cetirizine (ZYRTEC) 10 MG tablet, Take 10 mg by mouth daily., Disp: , Rfl:  Cod Liver Oil CAPS, Take by mouth daily.  , Disp: , Rfl:  ferrous fumarate (HEMOCYTE - 106 MG FE) 325 (106 FE) MG TABS, Take 1 tablet by mouth., Disp: , Rfl:  folic acid (FOLVITE) 800 MCG tablet, Take 400 mcg by mouth daily.  , Disp: , Rfl:  ramipril (ALTACE) 2.5 MG capsule, Take 1 capsule (2.5 mg total) by mouth daily., Disp: 90 capsule, Rfl: 3 rosuvastatin (CRESTOR) 5 MG tablet, Take 1 tablet (5 mg total) by mouth daily., Disp: 90 tablet, Rfl: 3 vitamin C (ASCORBIC ACID) 500 MG tablet, Take 500 mg by mouth daily.  , Disp: , Rfl:     Past Medical History:   MI (myocardial infarction)                                     Comment:remote anterior, with last stress test 09/2010   Herniated disc                                                 Comment:history of   Leukopenia                                                     Comment:history of, with positive ANA, stable   Hyperlipemia                                                   Comment:tolerating crestor and niacin   Neutropenia                                     09/14/2012    MGUS (monoclonal gammopathy of unknown signifi*  09/14/2012   Past Surgical History:   WISDOM TOOTH EXTRACTION                                      ANTERIOR CERVICAL DISCECTOMY  01-26-2002       Comment:C6-C7,  with a chronic C7 radiculopathy,               decompression of the C7 nerve root, bone graft,              plate.  Microscope    FINGER AMPUTATION                                              Comment:of "little finger", traumatic    VASECTOMY                                                    CARDIAC CATHETERIZATION                         09-06-2001   Review of patient's family history indicates:   Heart attack                   Father                     Comment: x3 between ages 69-64   Breast cancer                  Mother                   Other                          Sister                     Comment: angioplasty   Social History   Marital Status: Married             Spouse Name:                      Years of Education:                 Number of children:             Occupational History Occupation          Associate Professor            Comment              retired             Hospital doctor  Social History Main Topics   Smoking Status: Never Smoker                     Smokeless Status: Never Used                       Alcohol Use: Yes               Comment: rarely   Drug Use: No             Sexual Activity: Yes                Other Topics            Concern   None on file  Social History Narrative   None on file    [id number] otherwise see history of present illness   PHYSICAL EXAM: Well-nournished, in no acute distress. Neck: No JVD, HJR, Bruit, or thyroid enlargement  Lungs: No tachypnea, clear without wheezing, rales, or rhonchi  Cardiovascular: RRR, PMI not displaced, heart sounds normal, no murmurs, gallops, bruit, thrill, or heave.  Abdomen: BS normal. Soft without organomegaly, masses, lesions or tenderness.  Extremities: without cyanosis, clubbing or  edema. Good distal pulses bilateral  SKin: Warm, no lesions or rashes   Musculoskeletal: No deformities  Neuro: no focal signs  BP 100/70  Pulse 63  Ht 5' 11.5" (1.816 m)  Wt 189 lb (85.73 kg)  BMI 25.99 kg/m2   [id number] sinus rhythm with old anterior septal infarct

## 2012-11-08 ENCOUNTER — Encounter: Payer: Self-pay | Admitting: Cardiology

## 2013-02-03 ENCOUNTER — Other Ambulatory Visit: Payer: Self-pay

## 2013-02-14 ENCOUNTER — Encounter: Payer: Self-pay | Admitting: Cardiology

## 2013-07-25 ENCOUNTER — Other Ambulatory Visit: Payer: Self-pay

## 2013-08-24 ENCOUNTER — Other Ambulatory Visit: Payer: Self-pay | Admitting: Cardiology

## 2013-09-07 ENCOUNTER — Other Ambulatory Visit: Payer: Self-pay | Admitting: Cardiology

## 2013-09-14 ENCOUNTER — Telehealth: Payer: Self-pay | Admitting: Oncology

## 2013-09-14 ENCOUNTER — Encounter: Payer: Self-pay | Admitting: Oncology

## 2013-09-14 ENCOUNTER — Ambulatory Visit (HOSPITAL_BASED_OUTPATIENT_CLINIC_OR_DEPARTMENT_OTHER): Payer: Medicare Other | Admitting: Oncology

## 2013-09-14 ENCOUNTER — Other Ambulatory Visit (HOSPITAL_BASED_OUTPATIENT_CLINIC_OR_DEPARTMENT_OTHER): Payer: Medicare Other | Admitting: Lab

## 2013-09-14 VITALS — BP 116/77 | HR 72 | Temp 97.7°F | Resp 20 | Ht 71.5 in | Wt 195.3 lb

## 2013-09-14 DIAGNOSIS — D709 Neutropenia, unspecified: Secondary | ICD-10-CM

## 2013-09-14 DIAGNOSIS — D472 Monoclonal gammopathy: Secondary | ICD-10-CM

## 2013-09-14 DIAGNOSIS — D72819 Decreased white blood cell count, unspecified: Secondary | ICD-10-CM

## 2013-09-14 LAB — CBC WITH DIFFERENTIAL/PLATELET
EOS%: 4.2 % (ref 0.0–7.0)
HCT: 46.8 % (ref 38.4–49.9)
HGB: 16.1 g/dL (ref 13.0–17.1)
LYMPH%: 19.6 % (ref 14.0–49.0)
MONO%: 10.9 % (ref 0.0–14.0)
NEUT#: 2.2 10*3/uL (ref 1.5–6.5)
RBC: 5.07 10*6/uL (ref 4.20–5.82)
RDW: 13.5 % (ref 11.0–14.6)
WBC: 3.4 10*3/uL — ABNORMAL LOW (ref 4.0–10.3)

## 2013-09-14 NOTE — Telephone Encounter (Signed)
, °

## 2013-09-14 NOTE — Progress Notes (Signed)
OFFICE PROGRESS NOTE  CC Dr. 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  CURRENT THERAPY: Observation  INTERVAL HISTORY: Eugene Moreno 67 y.o. male returns for followup visit. He is seen on a yearly basis. He had a he feels well denies any fevers chills night sweats headaches shortness of breath chest pains palpitations no recent infections. His white count is 3.0 he is not neutropenic. Remainder of the 10 point review of systems is negative.  MEDICAL HISTORY: Past Medical History  Diagnosis Date  . MI (myocardial infarction)     remote anterior, with last stress test 09/2010  . Herniated disc     history of  . Leukopenia     history of, with positive ANA, stable  . Hyperlipemia     tolerating crestor and niacin  . Neutropenia 09/14/2012  . 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  MEDICATIONS:  Current Outpatient Prescriptions  Medication Sig Dispense Refill  . aspirin EC 81 MG tablet Take 2 tablets (162 mg total) by mouth daily.      . cetirizine (ZYRTEC) 10 MG tablet Take 10 mg by mouth daily.      Marland Kitchen Cod Liver Oil CAPS Take by mouth daily.        . CRESTOR 5 MG tablet TAKE 1 TABLET DAILY  90 tablet  0  . ferrous fumarate (HEMOCYTE - 106 MG FE) 325 (106 FE) MG TABS Take 1 tablet by mouth.      . folic acid (FOLVITE) 800 MCG tablet Take 400 mcg by mouth daily.        . ramipril (ALTACE) 2.5 MG capsule TAKE 1 CAPSULE DAILY  90 capsule  1  . vitamin C (ASCORBIC ACID) 500 MG tablet Take 500 mg by mouth daily.         No current facility-administered medications for this visit.    SURGICAL HISTORY:  Past Surgical History  Procedure Laterality Date  . Wisdom tooth extraction    . Anterior cervical discectomy  01-26-2002    C6-C7,  with a chronic C7 radiculopathy, decompression of the C7 nerve  root, bone graft, plate.  Microscope   . Finger amputation      of "little finger", traumatic   . Vasectomy    . Cardiac catheterization  09-06-2001    REVIEW OF SYSTEMS:  Pertinent items are noted in HPI.   PHYSICAL EXAMINATION:  General a well-developed nourished in no acute distress  HEENT exam EOMI PERRLA sclerae anicteric no conjunctival pallor oral mucosa is moist no thrush no mucositis neck is supple no palpable cervical supraclavicular or axillary adenopathy lungs are clear to auscultation and percussion cardiovascular is regular rate rhythm no murmurs gallops or rubs abdomen is soft nontender nondistended bowel sounds are present no HSM extremities no edema neuro patient's alert oriented otherwise nonfocal.  ECOG PERFORMANCE STATUS: 0 - Asymptomatic  Blood pressure 116/77, pulse 72, temperature 97.7 F (36.5 C), temperature source Oral, resp. rate 20, height 5' 11.5" (1.816 m), weight 195 lb 4.8 oz (88.587 kg).  LABORATORY DATA: Lab Results  Component Value Date   WBC 3.4* 09/14/2013   HGB 16.1 09/14/2013   HCT 46.8 09/14/2013   MCV 92.3 09/14/2013   PLT 159 09/14/2013      Chemistry      Component Value Date/Time   NA 139 09/14/2012  0949   NA 137 11/17/2011 0944   NA 141 05/21/2009 1438   K 4.2 09/14/2012 0949   K 4.4 11/17/2011 0944   K 4.1 05/21/2009 1438   CL 108* 09/14/2012 0949   CL 103 11/17/2011 0944   CL 100 05/21/2009 1438   CO2 22 09/14/2012 0949   CO2 26 11/17/2011 0944   CO2 29 05/21/2009 1438   BUN 12.0 09/14/2012 0949   BUN 16 11/17/2011 0944   BUN 14 05/21/2009 1438   CREATININE 0.9 09/14/2012 0949   CREATININE 1.0 11/17/2011 0944   CREATININE 0.9 05/21/2009 1438      Component Value Date/Time   CALCIUM 9.7 09/14/2012 0949   CALCIUM 9.4 11/17/2011 0944   CALCIUM 8.8 05/21/2009 1438   ALKPHOS 72 09/14/2012 0949   ALKPHOS 68 06/08/2012 0902   ALKPHOS 64 05/21/2009 1438   AST 21 09/14/2012 0949   AST 25 06/08/2012 0902   AST 37 05/21/2009 1438   ALT 25 09/14/2012 0949    ALT 26 06/08/2012 0902   ALT 31 05/21/2009 1438   BILITOT 0.50 09/14/2012 0949   BILITOT 0.6 06/08/2012 0902   BILITOT 0.80 05/21/2009 1438       RADIOGRAPHIC STUDIES:  No results found.  ASSESSMENT:  67 year old gentleman with MGUS and mild leukopenia without any problems. He remains very stable    PLAN:  we will continue to follow him on a yearly basis. He continues to be seen by his cardiologist.   All questions were answered. The patient knows to call the clinic with any problems, questions or concerns. We can certainly see the patient much sooner if necessary.  I spent 15 minutes counseling the patient face to face. The total time spent in the appointment was 30 minutes.    Drue Second, MD Medical/Oncology [facility] [phone] (beeper) [phone] (Office)  09/14/2013, 9:46 AM

## 2013-09-18 LAB — SPEP & IFE WITH QIG
Albumin ELP: 54.8 % — ABNORMAL LOW (ref 55.8–66.1)
Alpha-2-Globulin: 8.4 % (ref 7.1–11.8)
Beta 2: 3.8 % (ref 3.2–6.5)
Beta Globulin: 4.9 % (ref 4.7–7.2)
IgA: 143 mg/dL (ref 68–379)
IgG (Immunoglobin G), Serum: 1890 mg/dL — ABNORMAL HIGH (ref 650–1600)

## 2013-10-08 ENCOUNTER — Telehealth: Payer: Self-pay | Admitting: Cardiology

## 2013-10-08 NOTE — Telephone Encounter (Signed)
Follow Up:  Pt states he has an appt w/ Dr. Shirlee Latch tomorrow. Pt would like to have a lipid and Hepatic Function test ordered for tomorrow. Pt states he lives over an hour away and wants his labs and his appt for the same day. However, there is not an order in the computer. Please advise

## 2013-10-08 NOTE — Telephone Encounter (Signed)
New problem   Pt want to know if he need to come in to fast for lipids for tomorrow's appt. Please advise pt today.

## 2013-10-08 NOTE — Telephone Encounter (Signed)
Pt's wife called regarding blood work. Pt has an office visit tomorrow 10/21 at 10:45 AM, with Dr. Shirlee Latch, wife would like to know if pt can come fasting so labs for Lipids and Liver can be done same day of OV. Pt's wife is aware that the last time labs were checked on 06/08/12 and is possible that MD would wants to checks labs. Pt's wife states that pt will come fasting just in case labs are done.

## 2013-10-08 NOTE — Telephone Encounter (Signed)
Pt aware to come fasting and once he sees Dr Shirlee Latch all lab orders will be placed.  He states understanding

## 2013-10-09 ENCOUNTER — Ambulatory Visit (INDEPENDENT_AMBULATORY_CARE_PROVIDER_SITE_OTHER): Payer: Medicare Other | Admitting: Cardiology

## 2013-10-09 ENCOUNTER — Encounter: Payer: Self-pay | Admitting: Cardiology

## 2013-10-09 VITALS — BP 114/72 | HR 55 | Ht 71.5 in | Wt 194.0 lb

## 2013-10-09 DIAGNOSIS — E785 Hyperlipidemia, unspecified: Secondary | ICD-10-CM

## 2013-10-09 DIAGNOSIS — I2510 Atherosclerotic heart disease of native coronary artery without angina pectoris: Secondary | ICD-10-CM

## 2013-10-09 LAB — BASIC METABOLIC PANEL
BUN: 13 mg/dL (ref 6–23)
Calcium: 9.5 mg/dL (ref 8.4–10.5)
Chloride: 102 mEq/L (ref 96–112)
Creatinine, Ser: 0.9 mg/dL (ref 0.4–1.5)
GFR: 93.14 mL/min (ref >60.00)

## 2013-10-09 LAB — LIPID PANEL
Cholesterol: 116 mg/dL (ref 0–200)
LDL Cholesterol: 58 mg/dL (ref 0–99)
VLDL: 23 mg/dL (ref 0.0–40.0)

## 2013-10-09 NOTE — Patient Instructions (Signed)
Your physician recommends that you have  a FASTING lipid profile /BMET.  Your physician wants you to follow-up in: 1 year with Dr Shirlee Latch. (October 2015).  You will receive a reminder letter in the mail two months in advance. If you don't receive a letter, please call our office to schedule the follow-up appointment.

## 2013-10-09 NOTE — Progress Notes (Signed)
Patient ID: Eugene Moreno, male   DOB: 1946/10/03, 67 y.o.   MRN: [id number] PCP: Dr. Zachery Dauer  67 yo with history of CAD s/p anterior MI in 9/02 presents for cardiology evaluation.  Last nuclear stress test in 2010 showed no evidence for ischemia. He has been doing well symptomatically.  No exertional dyspnea or chest pain.  He occasionally walks on his treadmill for exercise but not as regularly as in the past.  He is retired and is doing a lot of traveling.    ECG: NSR at 85, old ASMI, LAFB  Labs (6/13): LDL 45, HDL 34 Labs (9/13): K 4.2, creatinine 0.5  PMH: 1. CAD: Anterior MI in 9/02 with PCI to LAD.  Cardiolite in 2010 with EF 54%, minimal anterior hypokinesis, no ischemia.  2. 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  4. Hyperlipidemia 5. 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  Nonsmoker.  Rare ETOH.  Married, lives in [place].   FH: Father with MI at 47, sister with angioplasty at 19.    Current Outpatient Prescriptions  Medication Sig Dispense Refill  . aspirin EC 81 MG tablet Take 2 tablets (162 mg total) by mouth daily.      . cetirizine (ZYRTEC) 10 MG tablet Take 10 mg by mouth daily.      Marland Kitchen Cod Liver Oil CAPS Take by mouth daily.        . CRESTOR 5 MG tablet TAKE 1 TABLET DAILY  90 tablet  0  . ferrous fumarate (HEMOCYTE - 106 MG FE) 325 (106 FE) MG TABS Take 1 tablet by mouth.      . folic acid (FOLVITE) 800 MCG tablet Take 800 mcg by mouth daily.       . ramipril (ALTACE) 2.5 MG capsule TAKE 1 CAPSULE DAILY  90 capsule  1  . vitamin C (ASCORBIC ACID) 500 MG tablet Take 1,000 mg by mouth daily.       . vitamin E 400 UNIT capsule Take 400 Units by mouth daily.       No current facility-administered medications for this visit.    BP 114/72  Pulse 55  Ht 5' 11.5" (1.816 m)  Wt 87.998 kg (194 lb)  BMI 26.68 kg/m2 General: NAD Neck: No JVD, no thyromegaly or thyroid nodule.  Lungs: Clear  to auscultation bilaterally with normal respiratory effort. CV: Nondisplaced PMI.  Heart regular S1/S2, no S3/S4, no murmur.  No peripheral edema.  No carotid bruit.  Normal pedal pulses.  Abdomen: Soft, nontender, no hepatosplenomegaly, no distention.  Neurologic: Alert and oriented x 3.  Psych: Normal affect. Extremities: No clubbing or cyanosis.   Assessment/Plan: 1. CAD: No ischemic symptoms.  Continue ASA 81, statin, ramipril.  2. Hyperlipidemia: Check lipids today.    Marca Ancona 10/09/2013 1:31 PM

## 2013-10-10 ENCOUNTER — Telehealth: Payer: Self-pay | Admitting: Cardiology

## 2013-10-10 NOTE — Telephone Encounter (Signed)
Follow up   Pt is returning call    

## 2013-10-10 NOTE — Telephone Encounter (Signed)
Spoke with patient's wife.

## 2013-10-25 ENCOUNTER — Other Ambulatory Visit: Payer: Self-pay

## 2013-11-22 ENCOUNTER — Other Ambulatory Visit: Payer: Self-pay | Admitting: Cardiology

## 2014-01-28 ENCOUNTER — Other Ambulatory Visit: Payer: Self-pay | Admitting: Cardiology

## 2014-04-28 ENCOUNTER — Other Ambulatory Visit: Payer: Self-pay | Admitting: Cardiology

## 2014-05-12 ENCOUNTER — Other Ambulatory Visit: Payer: Self-pay | Admitting: Cardiology

## 2014-07-26 ENCOUNTER — Other Ambulatory Visit: Payer: Self-pay | Admitting: Family Medicine

## 2014-07-26 DIAGNOSIS — N508 Other specified disorders of male genital organs: Secondary | ICD-10-CM

## 2014-07-27 ENCOUNTER — Other Ambulatory Visit: Payer: Self-pay | Admitting: Cardiology

## 2014-07-29 ENCOUNTER — Other Ambulatory Visit: Payer: Self-pay | Admitting: Family Medicine

## 2014-07-29 ENCOUNTER — Ambulatory Visit
Admission: RE | Admit: 2014-07-29 | Discharge: 2014-07-29 | Disposition: A | Discharge status: Home / Self Care | Payer: Medicare Other | Source: Ambulatory Visit | Attending: Family Medicine | Admitting: Family Medicine

## 2014-07-29 DIAGNOSIS — N508 Other specified disorders of male genital organs: Secondary | ICD-10-CM

## 2014-07-29 DIAGNOSIS — N5089 Other specified disorders of the male genital organs: Secondary | ICD-10-CM

## 2014-09-06 ENCOUNTER — Telehealth: Payer: Self-pay | Admitting: Adult Health

## 2014-09-06 NOTE — Telephone Encounter (Signed)
, °

## 2014-09-09 ENCOUNTER — Telehealth: Payer: Self-pay | Admitting: Adult Health

## 2014-09-09 NOTE — Telephone Encounter (Signed)
, °

## 2014-09-13 ENCOUNTER — Ambulatory Visit: Payer: Medicare Other | Admitting: Oncology

## 2014-09-13 ENCOUNTER — Other Ambulatory Visit: Payer: Medicare Other

## 2014-09-25 ENCOUNTER — Other Ambulatory Visit: Payer: Medicare Other

## 2014-09-25 ENCOUNTER — Telehealth: Payer: Self-pay | Admitting: Adult Health

## 2014-09-25 ENCOUNTER — Ambulatory Visit: Payer: Medicare Other | Admitting: Adult Health

## 2014-09-25 NOTE — Telephone Encounter (Signed)
, °

## 2014-09-30 ENCOUNTER — Telehealth: Payer: Self-pay | Admitting: Adult Health

## 2014-09-30 NOTE — Telephone Encounter (Signed)
return call to wife with new apt d/t

## 2014-10-02 ENCOUNTER — Other Ambulatory Visit: Payer: Medicare Other

## 2014-10-02 ENCOUNTER — Ambulatory Visit: Payer: Medicare Other | Admitting: Adult Health

## 2014-10-08 ENCOUNTER — Ambulatory Visit: Payer: Medicare Other | Admitting: Adult Health

## 2014-10-08 ENCOUNTER — Other Ambulatory Visit: Payer: Medicare Other

## 2014-10-21 ENCOUNTER — Other Ambulatory Visit (HOSPITAL_BASED_OUTPATIENT_CLINIC_OR_DEPARTMENT_OTHER): Payer: Medicare Other

## 2014-10-21 ENCOUNTER — Ambulatory Visit (HOSPITAL_BASED_OUTPATIENT_CLINIC_OR_DEPARTMENT_OTHER): Payer: Medicare Other | Admitting: Adult Health

## 2014-10-21 ENCOUNTER — Encounter: Payer: Self-pay | Admitting: Adult Health

## 2014-10-21 ENCOUNTER — Telehealth: Payer: Self-pay | Admitting: Adult Health

## 2014-10-21 VITALS — BP 120/72 | HR 59 | Temp 98.2°F | Resp 18 | Ht 71.5 in | Wt 195.0 lb

## 2014-10-21 DIAGNOSIS — D72819 Decreased white blood cell count, unspecified: Secondary | ICD-10-CM

## 2014-10-21 DIAGNOSIS — D472 Monoclonal gammopathy: Secondary | ICD-10-CM

## 2014-10-21 LAB — COMPREHENSIVE METABOLIC PANEL (CC13)
ALBUMIN: 4.1 g/dL (ref 3.5–5.0)
ALK PHOS: 72 U/L (ref 40–150)
ALT: 28 U/L (ref 0–55)
AST: 22 U/L (ref 5–34)
Anion Gap: 8 mEq/L (ref 3–11)
BILIRUBIN TOTAL: 0.54 mg/dL (ref 0.20–1.20)
BUN: 14.4 mg/dL (ref 7.0–26.0)
CO2: 26 mEq/L (ref 22–29)
CREATININE: 1 mg/dL (ref 0.7–1.3)
Calcium: 9.6 mg/dL (ref 8.4–10.4)
Chloride: 107 mEq/L (ref 98–109)
GLUCOSE: 108 mg/dL (ref 70–140)
POTASSIUM: 4.3 meq/L (ref 3.5–5.1)
Sodium: 140 mEq/L (ref 136–145)
Total Protein: 7.9 g/dL (ref 6.4–8.3)

## 2014-10-21 LAB — CBC WITH DIFFERENTIAL/PLATELET
BASO%: 0.3 % (ref 0.0–2.0)
Basophils Absolute: 0 10*3/uL (ref 0.0–0.1)
EOS ABS: 0.1 10*3/uL (ref 0.0–0.5)
EOS%: 2.4 % (ref 0.0–7.0)
HEMATOCRIT: 43.2 % (ref 38.4–49.9)
HEMOGLOBIN: 14.7 g/dL (ref 13.0–17.1)
LYMPH%: 23.8 % (ref 14.0–49.0)
MCH: 31 pg (ref 27.2–33.4)
MCHC: 34 g/dL (ref 32.0–36.0)
MCV: 91.1 fL (ref 79.3–98.0)
MONO#: 0.4 10*3/uL (ref 0.1–0.9)
MONO%: 12.1 % (ref 0.0–14.0)
NEUT%: 61.4 % (ref 39.0–75.0)
NEUTROS ABS: 1.8 10*3/uL (ref 1.5–6.5)
PLATELETS: 153 10*3/uL (ref 140–400)
RBC: 4.74 10*6/uL (ref 4.20–5.82)
RDW: 12.7 % (ref 11.0–14.6)
WBC: 2.9 10*3/uL — ABNORMAL LOW (ref 4.0–10.3)
lymph#: 0.7 10*3/uL — ABNORMAL LOW (ref 0.9–3.3)

## 2014-10-21 NOTE — Progress Notes (Addendum)
OFFICE PROGRESS NOTE  CC Dr. Aundra Dubin  DIAGNOSIS: 68 year old gentleman with leukopenia and MGUS  PRIOR THERAPY:  #1 patient has been on observation only for both questionable MGUS and leukopenia.  CURRENT THERAPY: Observation  INTERVAL HISTORY: Eugene Moreno 68 y.o. male returns for followup visit. He is seen on a yearly basis.  He continues to do very well.  He is retired and traveling over the world with his wife.  He has been to [place] recently and is going to [place] in February.  He does not have any fatigue, new pain, changes in urination, recurrent infections, or any further concerns.    MEDICAL HISTORY: Past Medical History  Diagnosis Date  . MI (myocardial infarction)     remote anterior, with last stress test 09/2010  . Herniated disc     history of  . Leukopenia     history of, with positive ANA, stable  . Hyperlipemia     tolerating crestor and niacin  . Neutropenia 09/14/2012  . 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  MEDICATIONS:  Current Outpatient Prescriptions  Medication Sig Dispense Refill  . aspirin EC 81 MG tablet Take 2 tablets (162 mg total) by mouth daily.    . cetirizine (ZYRTEC) 10 MG tablet Take 10 mg by mouth daily.    Marland Kitchen Cod Liver Oil CAPS Take by mouth daily.      . CRESTOR 5 MG tablet TAKE 1 TABLET DAILY 90 tablet 1  . ferrous fumarate (HEMOCYTE - 106 MG FE) 325 (106 FE) MG TABS Take 1 tablet by mouth.    . folic acid (FOLVITE) 147 MCG tablet Take 800 mcg by mouth daily.     . ramipril (ALTACE) 2.5 MG capsule TAKE 1 CAPSULE DAILY 90 capsule 1  . vitamin C (ASCORBIC ACID) 500 MG tablet Take 1,000 mg by mouth daily.     . vitamin E 400 UNIT capsule Take 400 Units by mouth daily.     No current facility-administered medications for this visit.    SURGICAL HISTORY:  Past Surgical History  Procedure Laterality Date  . Wisdom tooth extraction    . Anterior cervical discectomy   01-26-2002    C6-C7,  with a chronic C7 radiculopathy, decompression of the C7 nerve root, bone graft, plate.  Microscope   . Finger amputation      of "little finger", traumatic   . Vasectomy    . Cardiac catheterization  09-06-2001    REVIEW OF SYSTEMS:  A 10 point review of systems was conducted and is otherwise negative except for what is noted above.      PHYSICAL EXAMINATION:  BP 120/72 mmHg  Pulse 59  Temp(Src) 98.2 F (36.8 C) (Oral)  Resp 18  Ht 5' 11.5" (1.816 m)  Wt 195 lb (88.451 kg)  BMI 26.82 kg/m2 GENERAL: Patient is a well appearing male in no acute distress HEENT:  Sclerae anicteric.  Oropharynx clear and moist. No ulcerations or evidence of oropharyngeal candidiasis. Neck is supple.  NODES:  No cervical, supraclavicular, or axillary lymphadenopathy palpated.  LUNGS:  Clear to auscultation bilaterally.  No wheezes or rhonchi. HEART:  Regular rate and rhythm. No murmur appreciated. ABDOMEN:  Soft, nontender.  Positive, normoactive bowel sounds. No organomegaly palpated. MSK:  No focal spinal tenderness to palpation. Full range of motion bilaterally in the upper extremities. EXTREMITIES:  No peripheral edema.   SKIN:  Clear with no obvious rashes or  skin changes. No nail dyscrasia. NEURO:  Nonfocal. Well oriented.  Appropriate affect. ECOG PERFORMANCE STATUS: 0 - Asymptomatic  LABORATORY DATA: Lab Results  Component Value Date   WBC 2.9* 10/21/2014   HGB 14.7 10/21/2014   HCT 43.2 10/21/2014   MCV 91.1 10/21/2014   PLT 153 10/21/2014      Chemistry      Component Value Date/Time   NA 140 10/21/2014 1044   NA 137 10/09/2013 1125   NA 141 05/21/2009 1438   K 4.3 10/21/2014 1044   K 4.3 10/09/2013 1125   K 4.1 05/21/2009 1438   CL 102 10/09/2013 1125   CL 108* 09/14/2012 0949   CL 100 05/21/2009 1438   CO2 26 10/21/2014 1044   CO2 26 10/09/2013 1125   CO2 29 05/21/2009 1438   BUN 14.4 10/21/2014 1044   BUN 13 10/09/2013 1125   BUN 14 05/21/2009 1438    CREATININE 1.0 10/21/2014 1044   CREATININE 0.9 10/09/2013 1125   CREATININE 0.9 05/21/2009 1438      Component Value Date/Time   CALCIUM 9.6 10/21/2014 1044   CALCIUM 9.5 10/09/2013 1125   CALCIUM 8.8 05/21/2009 1438   ALKPHOS 72 10/21/2014 1044   ALKPHOS 68 06/08/2012 0902   ALKPHOS 64 05/21/2009 1438   AST 22 10/21/2014 1044   AST 25 06/08/2012 0902   AST 37 05/21/2009 1438   ALT 28 10/21/2014 1044   ALT 26 06/08/2012 0902   ALT 31 05/21/2009 1438   BILITOT 0.54 10/21/2014 1044   BILITOT 0.6 06/08/2012 0902   BILITOT 0.80 05/21/2009 1438       RADIOGRAPHIC STUDIES:  No results found.  ASSESSMENT:  68 year old gentleman with questionable MGUS and mild leukopenia without any problems. He remains very stable    PLAN:    Eugene Moreno is doing very well today.  I reviewed his CBC with him which is stable.  He does continue to have a very mild decreased WBC count.  His SPEP is pending.  I reviewed his case with Dr. Lindi Adie who also met with the patient to establish care.  The patient will return in 1 year for labs and f/u with Dr. Lindi Adie.     All questions were answered. The patient knows to call the clinic with any problems, questions or concerns. We can certainly see the patient much sooner if necessary.  I spent 15 minutes counseling the patient face to face. The total time spent in the appointment was 30 minutes.    Minette Headland, NP [facility] [phone] 10/21/2014, 12:50 PM  Attending Note  I personally saw and examined [name]. The plan of care was discussed with him. I agree with the assessment and plan as documented above. Previous years blood work revealed no evidence of M protein on serum protein electrophoresis. He has elevated IgG levels. Based on this I would recommend continued on serial followup for another couple more years and if that is normal, he may not need to be followed by [place]. Signed Rulon Eisenmenger, MD

## 2014-10-21 NOTE — Telephone Encounter (Signed)
, °

## 2014-10-23 LAB — SPEP & IFE WITH QIG
Albumin ELP: 55.1 % — ABNORMAL LOW (ref 55.8–66.1)
Alpha-1-Globulin: 3.8 % (ref 2.9–4.9)
Alpha-2-Globulin: 9.4 % (ref 7.1–11.8)
BETA 2: 3.6 % (ref 3.2–6.5)
Beta Globulin: 5 % (ref 4.7–7.2)
Gamma Globulin: 23.1 % — ABNORMAL HIGH (ref 11.1–18.8)
IGA: 160 mg/dL (ref 68–379)
IGG (IMMUNOGLOBIN G), SERUM: 1790 mg/dL — AB (ref 650–1600)
IGM, SERUM: 53 mg/dL (ref 41–251)
TOTAL PROTEIN, SERUM ELECTROPHOR: 7.8 g/dL (ref 6.0–8.3)

## 2014-10-25 ENCOUNTER — Telehealth: Payer: Self-pay | Admitting: *Deleted

## 2014-10-25 NOTE — Telephone Encounter (Signed)
As noted below called pt to inform him of lab results. No answer but left a detailed message to Pt's VM. Also left my number if he has any questions concerning his labs. Message to be forwarded to [place].

## 2014-10-25 NOTE — Telephone Encounter (Signed)
-----   Message from Minette Headland, NP sent at 10/24/2014  4:22 PM EST ----- Labs are stable.  NO change.  Please call patient.  Thanks,  LC  ----- Message -----    From: [facility] Interface    Sent: 10/21/2014  10:57 AM      To: Minette Headland, NP

## 2014-11-20 ENCOUNTER — Ambulatory Visit (INDEPENDENT_AMBULATORY_CARE_PROVIDER_SITE_OTHER): Payer: Medicare Other | Admitting: Cardiology

## 2014-11-20 ENCOUNTER — Other Ambulatory Visit (INDEPENDENT_AMBULATORY_CARE_PROVIDER_SITE_OTHER): Payer: Medicare Other | Admitting: *Deleted

## 2014-11-20 VITALS — BP 104/68 | HR 51 | Ht 71.0 in | Wt 195.0 lb

## 2014-11-20 DIAGNOSIS — I2510 Atherosclerotic heart disease of native coronary artery without angina pectoris: Secondary | ICD-10-CM

## 2014-11-20 DIAGNOSIS — E785 Hyperlipidemia, unspecified: Secondary | ICD-10-CM

## 2014-11-20 NOTE — Patient Instructions (Signed)
Your physician wants you to follow-up in: 1 year with Dr McLean. (December 2016). You will receive a reminder letter in the mail two months in advance. If you don't receive a letter, please call our office to schedule the follow-up appointment.  

## 2014-11-21 ENCOUNTER — Encounter: Payer: Self-pay | Admitting: Cardiology

## 2014-11-21 LAB — BASIC METABOLIC PANEL
BUN: 16 mg/dL (ref 6–23)
CO2: 23 mEq/L (ref 19–32)
Calcium: 9 mg/dL (ref 8.4–10.5)
Chloride: 105 mEq/L (ref 96–112)
Creatinine, Ser: 1 mg/dL (ref 0.4–1.5)
GFR: 81.87 mL/min (ref >60.00)
Glucose, Bld: 85 mg/dL (ref 70–99)
POTASSIUM: 4.1 meq/L (ref 3.5–5.1)
Sodium: 137 mEq/L (ref 135–145)

## 2014-11-21 LAB — HEPATIC FUNCTION PANEL
ALT: 22 U/L (ref 0–53)
AST: 27 U/L (ref 0–37)
Albumin: 4.5 g/dL (ref 3.5–5.2)
Alkaline Phosphatase: 67 U/L (ref 39–117)
BILIRUBIN DIRECT: 0.1 mg/dL (ref 0.0–0.3)
BILIRUBIN TOTAL: 0.9 mg/dL (ref 0.2–1.2)
Total Protein: 8.1 g/dL (ref 6.0–8.3)

## 2014-11-21 LAB — LIPID PANEL
CHOLESTEROL: 111 mg/dL (ref 0–200)
HDL: 27.4 mg/dL — ABNORMAL LOW (ref >39.00)
LDL Cholesterol: 63 mg/dL (ref 0–99)
NonHDL: 83.6
TRIGLYCERIDES: 105 mg/dL (ref 0.0–149.0)
Total CHOL/HDL Ratio: 4
VLDL: 21 mg/dL (ref 0.0–40.0)

## 2014-11-21 NOTE — Progress Notes (Signed)
Patient ID: Eugene Moreno, male   DOB: [birth year], 68 y.o.   MRN: [id number] PCP: Dr. Drema Dallas  68 yo with history of CAD s/p anterior MI in 9/02 presents for cardiology evaluation.  Last nuclear stress test in 2010 showed no evidence for ischemia. He has been doing well symptomatically.  No exertional dyspnea or chest pain.  He tries to walk 4 miles/day on his treadmill. He is retired and is doing a lot of traveling.    ECG: NSR, iRBBB, LAFB, borderline LVH, old ASMI  Labs (6/13): LDL 45, HDL 34 Labs (9/13): K 4.2, creatinine 0.5 Labs (11/15): K 4.3, creatinine 1.0  PMH: 1. CAD: Anterior MI in 9/02 with PCI to LAD.  Cardiolite in 2010 with EF 54%, minimal anterior hypokinesis, no ischemia.  2. Cervical disc disease 3. 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  4. Hyperlipidemia 5. H/o positive ANA  SH: Retired Education officer, [employer].  Nonsmoker.  Rare ETOH.  Married, lives in [place].   FH: Father with MI at 64, sister with angioplasty at 48.    Current Outpatient Prescriptions  Medication Sig Dispense Refill  . aspirin EC 81 MG tablet Take 2 tablets (162 mg total) by mouth daily.    . cetirizine (ZYRTEC) 10 MG tablet Take 10 mg by mouth daily.    Marland Kitchen Cod Liver Oil CAPS Take by mouth daily.      . CRESTOR 5 MG tablet TAKE 1 TABLET DAILY 90 tablet 1  . ferrous fumarate (HEMOCYTE - 106 MG FE) 325 (106 FE) MG TABS Take 1 tablet by mouth.    Marland Kitchen FLUZONE HIGH-DOSE 0.5 ML SUSY   0  . folic acid (FOLVITE) 469 MCG tablet Take 800 mcg by mouth daily.     . ramipril (ALTACE) 2.5 MG capsule TAKE 1 CAPSULE DAILY 90 capsule 1  . vitamin C (ASCORBIC ACID) 500 MG tablet Take 1,000 mg by mouth daily.     . vitamin E 400 UNIT capsule Take 400 Units by mouth daily.     No current facility-administered medications for this visit.    BP 104/68 mmHg  Pulse 51  Ht 5\' 11"  (1.803 m)  Wt 195 lb (88.451 kg)  BMI 27.21 kg/m2 General: NAD Neck: No JVD, no thyromegaly or thyroid  nodule.  Lungs: Clear to auscultation bilaterally with normal respiratory effort. CV: Nondisplaced PMI.  Heart regular S1/S2, no S3/S4, no murmur.  No peripheral edema.  No carotid bruit.  Normal pedal pulses.  Abdomen: Soft, nontender, no hepatosplenomegaly, no distention.  Neurologic: Alert and oriented x 3.  Psych: Normal affect. Extremities: No clubbing or cyanosis.   Assessment/Plan: 1. CAD: No ischemic symptoms.  Continue ASA 81, statin, ramipril.  2. Hyperlipidemia: Check lipids today.    Followup in 1 year.   Loralie Champagne 11/21/2014

## 2015-01-02 ENCOUNTER — Other Ambulatory Visit: Payer: Self-pay | Admitting: Cardiology

## 2015-01-16 ENCOUNTER — Other Ambulatory Visit: Payer: Self-pay | Admitting: Cardiology

## 2015-06-16 ENCOUNTER — Other Ambulatory Visit: Payer: Self-pay

## 2015-09-27 ENCOUNTER — Other Ambulatory Visit: Payer: Self-pay | Admitting: Cardiology

## 2015-09-30 ENCOUNTER — Telehealth: Payer: Self-pay | Admitting: Hematology and Oncology

## 2015-09-30 NOTE — Telephone Encounter (Signed)
Patient called in to reschedule his appointment °

## 2015-10-23 ENCOUNTER — Ambulatory Visit: Payer: Medicare Other | Admitting: Hematology and Oncology

## 2015-10-23 ENCOUNTER — Other Ambulatory Visit: Payer: Medicare Other

## 2015-10-31 ENCOUNTER — Other Ambulatory Visit: Payer: Self-pay

## 2015-10-31 ENCOUNTER — Other Ambulatory Visit: Payer: Self-pay | Admitting: *Deleted

## 2015-10-31 DIAGNOSIS — D472 Monoclonal gammopathy: Secondary | ICD-10-CM

## 2015-11-03 ENCOUNTER — Telehealth: Payer: Self-pay | Admitting: Hematology and Oncology

## 2015-11-03 ENCOUNTER — Encounter: Payer: Self-pay | Admitting: Hematology and Oncology

## 2015-11-03 ENCOUNTER — Ambulatory Visit (HOSPITAL_BASED_OUTPATIENT_CLINIC_OR_DEPARTMENT_OTHER): Payer: Medicare Other | Admitting: Hematology and Oncology

## 2015-11-03 ENCOUNTER — Other Ambulatory Visit (HOSPITAL_BASED_OUTPATIENT_CLINIC_OR_DEPARTMENT_OTHER): Payer: Medicare Other

## 2015-11-03 VITALS — BP 121/66 | HR 68 | Temp 97.8°F | Resp 19 | Ht 71.0 in | Wt 187.4 lb

## 2015-11-03 DIAGNOSIS — D472 Monoclonal gammopathy: Secondary | ICD-10-CM

## 2015-11-03 LAB — CBC WITH DIFFERENTIAL/PLATELET
BASO%: 0.7 % (ref 0.0–2.0)
Basophils Absolute: 0 10*3/uL (ref 0.0–0.1)
EOS ABS: 0.1 10*3/uL (ref 0.0–0.5)
EOS%: 3.3 % (ref 0.0–7.0)
HCT: 44 % (ref 38.4–49.9)
HGB: 14.7 g/dL (ref 13.0–17.1)
LYMPH%: 19.1 % (ref 14.0–49.0)
MCH: 31.2 pg (ref 27.2–33.4)
MCHC: 33.3 g/dL (ref 32.0–36.0)
MCV: 93.6 fL (ref 79.3–98.0)
MONO#: 0.4 10*3/uL (ref 0.1–0.9)
MONO%: 13.7 % (ref 0.0–14.0)
NEUT#: 1.9 10*3/uL (ref 1.5–6.5)
NEUT%: 63.2 % (ref 39.0–75.0)
PLATELETS: 160 10*3/uL (ref 140–400)
RBC: 4.71 10*6/uL (ref 4.20–5.82)
RDW: 12.5 % (ref 11.0–14.6)
WBC: 3 10*3/uL — ABNORMAL LOW (ref 4.0–10.3)
lymph#: 0.6 10*3/uL — ABNORMAL LOW (ref 0.9–3.3)

## 2015-11-03 LAB — COMPREHENSIVE METABOLIC PANEL (CC13)
ALT: 26 U/L (ref 0–55)
ANION GAP: 7 meq/L (ref 3–11)
AST: 24 U/L (ref 5–34)
Albumin: 4 g/dL (ref 3.5–5.0)
Alkaline Phosphatase: 75 U/L (ref 40–150)
BILIRUBIN TOTAL: 0.54 mg/dL (ref 0.20–1.20)
BUN: 14.1 mg/dL (ref 7.0–26.0)
CALCIUM: 9.5 mg/dL (ref 8.4–10.4)
CHLORIDE: 106 meq/L (ref 98–109)
CO2: 28 meq/L (ref 22–29)
Creatinine: 0.9 mg/dL (ref 0.7–1.3)
EGFR: 88 mL/min/{1.73_m2} — AB (ref >90)
Glucose: 94 mg/dl (ref 70–140)
Potassium: 4.4 mEq/L (ref 3.5–5.1)
Sodium: 141 mEq/L (ref 136–145)
TOTAL PROTEIN: 7.5 g/dL (ref 6.4–8.3)

## 2015-11-03 NOTE — Assessment & Plan Note (Signed)
MGUS and leukopenia diagnosed in 2013 Current treatment: Observation Review of blood work:  Patient does not have any sequelae from MGUS or leukopenia. Patient travels around the world accompanied by his wife. Recommendation: 1 year follow-up with labs

## 2015-11-03 NOTE — Telephone Encounter (Signed)
Appointments made and avs printed for pateint °

## 2015-11-03 NOTE — Progress Notes (Signed)
Patient Care Team: Leighton Ruff, MD as PCP - General (Family Medicine)  DIAGNOSIS: Leukopenia and MGUS Current treatment: Observation  CHIEF COMPLIANT: No symptoms feeling well  INTERVAL HISTORY: Eugene Moreno is a 69 year old with above-mentioned history of chronic mild leukopenia and MGUS who is here for annual follow-up. He reports no new health issues or concerns. He denies any bone pain or fatigue.  REVIEW OF SYSTEMS:   Constitutional: Denies fevers, chills or abnormal weight loss Eyes: Denies blurriness of vision Ears, nose, mouth, throat, and face: Denies mucositis or sore throat Respiratory: Denies cough, dyspnea or wheezes Cardiovascular: Denies palpitation, chest discomfort or lower extremity swelling Gastrointestinal:  Denies nausea, heartburn or change in bowel habits Skin: Denies abnormal skin rashes Lymphatics: Denies new lymphadenopathy or easy bruising Neurological:Denies numbness, tingling or new weaknesses Behavioral/Psych: Mood is stable, no new changes  All other systems were reviewed with the patient and are negative.  I have reviewed the past medical history, past surgical history, social history and family history with the patient and they are unchanged from previous note.  ALLERGIES:  has No Known Allergies.  MEDICATIONS:  Current Outpatient Prescriptions  Medication Sig Dispense Refill  . aspirin EC 81 MG tablet Take 2 tablets (162 mg total) by mouth daily.    . cetirizine (ZYRTEC) 10 MG tablet Take 10 mg by mouth daily.    Marland Kitchen Cod Liver Oil CAPS Take by mouth daily.      . ferrous fumarate (HEMOCYTE - 106 MG FE) 325 (106 FE) MG TABS Take 1 tablet by mouth.    Marland Kitchen FLUZONE HIGH-DOSE 0.5 ML SUSY   0  . folic acid (FOLVITE) Q000111Q MCG tablet Take 800 mcg by mouth daily.     . ramipril (ALTACE) 2.5 MG capsule TAKE 1 CAPSULE DAILY 90 capsule 1  . rosuvastatin (CRESTOR) 5 MG tablet TAKE 1 TABLET DAILY 90 tablet 1  . vitamin C (ASCORBIC ACID) 500 MG tablet  Take 1,000 mg by mouth daily.     . vitamin E 400 UNIT capsule Take 400 Units by mouth daily.     No current facility-administered medications for this visit.    PHYSICAL EXAMINATION: ECOG PERFORMANCE STATUS: 0 - Asymptomatic  Filed Vitals:   11/03/15 1147  BP: 121/66  Pulse: 68  Temp: 97.8 F (36.6 C)  Resp: 19   Filed Weights   11/03/15 1147  Weight: 187 lb 6.4 oz (85.004 kg)    GENERAL:alert, no distress and comfortable SKIN: skin color, texture, turgor are normal, no rashes or significant lesions EYES: normal, Conjunctiva are pink and non-injected, sclera clear OROPHARYNX:no exudate, no erythema and lips, buccal mucosa, and tongue normal  NECK: supple, thyroid normal size, non-tender, without nodularity LYMPH:  no palpable lymphadenopathy in the cervical, axillary or inguinal LUNGS: clear to auscultation and percussion with normal breathing effort HEART: regular rate & rhythm and no murmurs and no lower extremity edema ABDOMEN:abdomen soft, non-tender and normal bowel sounds Musculoskeletal:no cyanosis of digits and no clubbing  NEURO: alert & oriented x 3 with fluent speech, no focal motor/sensory deficits  LABORATORY DATA:  I have reviewed the data as listed   Chemistry      Component Value Date/Time   NA 137 11/20/2014 0942   NA 140 10/21/2014 1044   NA 141 05/21/2009 1438   K 4.1 11/20/2014 0942   K 4.3 10/21/2014 1044   K 4.1 05/21/2009 1438   CL 105 11/20/2014 0942   CL 108* 09/14/2012 0949  CL 100 05/21/2009 1438   CO2 23 11/20/2014 0942   CO2 26 10/21/2014 1044   CO2 29 05/21/2009 1438   BUN 16 11/20/2014 0942   BUN 14.4 10/21/2014 1044   BUN 14 05/21/2009 1438   CREATININE 1.0 11/20/2014 0942   CREATININE 1.0 10/21/2014 1044   CREATININE 0.9 05/21/2009 1438      Component Value Date/Time   CALCIUM 9.0 11/20/2014 0942   CALCIUM 9.6 10/21/2014 1044   CALCIUM 8.8 05/21/2009 1438   ALKPHOS 67 11/20/2014 0942   ALKPHOS 72 10/21/2014 1044    ALKPHOS 64 05/21/2009 1438   AST 27 11/20/2014 0942   AST 22 10/21/2014 1044   AST 37 05/21/2009 1438   ALT 22 11/20/2014 0942   ALT 28 10/21/2014 1044   ALT 31 05/21/2009 1438   BILITOT 0.9 11/20/2014 0942   BILITOT 0.54 10/21/2014 1044   BILITOT 0.80 05/21/2009 1438       Lab Results  Component Value Date   WBC 3.0* 11/03/2015   HGB 14.7 11/03/2015   HCT 44.0 11/03/2015   MCV 93.6 11/03/2015   PLT 160 11/03/2015   NEUTROABS 1.9 11/03/2015    ASSESSMENT & PLAN:  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 This is same as before or the past 3 years. We do not yet have the results of serum protein electrophoresis and quantitative immunoglobulins. I will call him with the results of these tests.  Patient does not have any sequelae from MGUS or leukopenia. Patient travels around the world accompanied by his wife. (Patient was recently in [place] and [place]) Recommendation: 1 year follow-up with labs After that visit we could probably have him follow with his primary care physician.  No orders of the defined types were placed in this encounter.   The patient has a good understanding of the overall plan. he agrees with it. he will call with any problems that may develop before the next visit here.   Rulon Eisenmenger, MD 11/03/2015

## 2015-11-03 NOTE — Addendum Note (Signed)
Addended by: Prentiss Bells on: 11/03/2015 05:11 PM   Modules accepted: Orders, Medications

## 2015-11-05 LAB — SPEP & IFE WITH QIG
ALPHA-1-GLOBULIN: 0.3 g/dL (ref 0.2–0.3)
Albumin ELP: 4.2 g/dL (ref 3.8–4.8)
Alpha-2-Globulin: 0.7 g/dL (ref 0.5–0.9)
Beta 2: 0.3 g/dL (ref 0.2–0.5)
Beta Globulin: 0.4 g/dL (ref 0.4–0.6)
GAMMA GLOBULIN: 1.6 g/dL (ref 0.8–1.7)
IGA: 134 mg/dL (ref 68–379)
IGM, SERUM: 31 mg/dL — AB (ref 41–251)
IgG (Immunoglobin G), Serum: 1790 mg/dL — ABNORMAL HIGH (ref 650–1600)
Total Protein, Serum Electrophoresis: 7.3 g/dL (ref 6.1–8.1)

## 2015-11-05 LAB — KAPPA/LAMBDA LIGHT CHAINS
KAPPA LAMBDA RATIO: 1.44 (ref 0.26–1.65)
Kappa free light chain: 2.46 mg/dL — ABNORMAL HIGH (ref 0.33–1.94)
Lambda Free Lght Chn: 1.71 mg/dL (ref 0.57–2.63)

## 2016-02-25 ENCOUNTER — Encounter: Payer: Self-pay | Admitting: Cardiology

## 2016-03-05 ENCOUNTER — Ambulatory Visit: Payer: Medicare Other | Admitting: Cardiology

## 2016-03-12 ENCOUNTER — Ambulatory Visit (INDEPENDENT_AMBULATORY_CARE_PROVIDER_SITE_OTHER): Payer: Medicare Other | Admitting: Physician Assistant

## 2016-03-12 ENCOUNTER — Encounter: Payer: Self-pay | Admitting: Physician Assistant

## 2016-03-12 VITALS — BP 120/80 | HR 60 | Ht 71.0 in | Wt 197.0 lb

## 2016-03-12 DIAGNOSIS — I2510 Atherosclerotic heart disease of native coronary artery without angina pectoris: Secondary | ICD-10-CM

## 2016-03-12 DIAGNOSIS — D472 Monoclonal gammopathy: Secondary | ICD-10-CM

## 2016-03-12 DIAGNOSIS — E785 Hyperlipidemia, unspecified: Secondary | ICD-10-CM | POA: Diagnosis not present

## 2016-03-12 NOTE — Progress Notes (Signed)
Cardiology Office Note   Date:  03/12/2016   ID:  Eugene Moreno, DOB February 23, 1946, MRN [id number]  PCP:  Gerrit Heck, MD  Cardiologist:  Dr. Aundra Dubin  Chief Complaint  Patient presents with  . Follow-up    seen for Dr. Aundra Dubin      History of Present Illness: Eugene Moreno is a 70 y.o. male who presents for office follow-up. He has a history of CAD s/p anterior MI in 9/02, hyperlipidemia, leukocytopenia, neutropenia and MGUS follow-up with Dr. Lindi Adie. Last Myoview performed in 2010 showed no evidence of ischemia. He was last seen by Dr. Aundra Dubin in the office on 11/20/2014, at which time he was doing well. It was recommended he follow-up in one year. He was last seen by his oncologist Dr. Lindi Adie in the office on 11/03/2015, at which time he was doing well. It was recommended continue observation at this time. It does not appear he has any sequela from MGUS.   He has been doing very well, he denies any chest discomfort, shortness of breath. His blood pressure is well-controlled, BP 120/80. He has been followed by his oncologist and has not noticed any significant change recently. We will obtain a fasting lipid panel. He can follow-up with Dr. Aundra Dubin in one year. I will not obtain LFT at this time since his oncologist just obtained one last November and was normal.   Past Medical History  Diagnosis Date  . MI (myocardial infarction) ([place])     remote anterior, with last stress test 09/2010  . Herniated disc     history of  . Leukopenia     history of, with positive ANA, stable  . Hyperlipemia     tolerating crestor and niacin  . Neutropenia ([place]) 09/14/2012  . 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 Wisdom tooth extraction    . Anterior cervical discectomy  01-26-2002    C6-C7,  with a chronic C7 radiculopathy, decompression of the C7 nerve root, bone graft, plate.  Microscope   . Finger  amputation      of "little finger", traumatic   . Vasectomy    . Cardiac catheterization  09-06-2001     Current Outpatient Prescriptions  Medication Sig Dispense Refill  . aspirin EC 81 MG tablet Take 2 tablets (162 mg total) by mouth daily.    . baclofen (LIORESAL) 20 MG tablet 20 mg 2 (two) times daily.    . cetirizine (ZYRTEC) 10 MG tablet Take 10 mg by mouth daily.    Marland Kitchen Cod Liver Oil CAPS Take by mouth daily.      . ferrous fumarate (HEMOCYTE - 106 MG FE) 325 (106 FE) MG TABS Take 1 tablet by mouth.    . folic acid (FOLVITE) Q000111Q MCG tablet Take 800 mcg by mouth daily.     . ramipril (ALTACE) 2.5 MG capsule TAKE 1 CAPSULE DAILY 90 capsule 1  . rosuvastatin (CRESTOR) 5 MG tablet TAKE 1 TABLET DAILY 90 tablet 1  . vitamin C (ASCORBIC ACID) 500 MG tablet Take 1,000 mg by mouth daily.     . vitamin E 400 UNIT capsule Take 400 Units by mouth daily.     No current facility-administered medications for this visit.    Allergies:   Review of patient's allergies indicates no known allergies.    Social History:  The patient  reports that he has never smoked. He has never used  smokeless tobacco. He reports that he drinks alcohol. He reports that he does not use illicit drugs.   Family History:  The patient's family history includes Breast cancer (age of onset: 68) in his mother; Heart attack in his father; Other (age of onset: 85) in his sister.    ROS:  Please see the history of present illness.   Otherwise, review of systems are positive for none.   All other systems are reviewed and negative.    PHYSICAL EXAM: VS:  BP 120/80 mmHg  Pulse 60  Ht 5\' 11"  (1.803 m)  Wt 197 lb (89.359 kg)  BMI 27.49 kg/m2 , BMI Body mass index is 27.49 kg/(m^2). GEN: Well nourished, well developed, in no acute distress HEENT: normal Neck: no JVD, carotid bruits, or masses Cardiac: RRR; no murmurs, rubs, or gallops,no edema  Respiratory:  clear to auscultation bilaterally, normal work of breathing GI:  soft, nontender, nondistended, + BS MS: no deformity or atrophy Skin: warm and dry, no rash Neuro:  Strength and sensation are intact Psych: euthymic mood, full affect   EKG:  EKG is ordered today. The ekg ordered today demonstrates NSR without significant ST-T wave changes   Recent Labs: 11/03/2015: ALT 26; BUN 14.1; Creatinine 0.9; HGB 14.7; Platelets 160; Potassium 4.4; Sodium 141    Lipid Panel    Component Value Date/Time   CHOL 111 11/20/2014 0942   TRIG 105.0 11/20/2014 0942   HDL 27.40* 11/20/2014 0942   CHOLHDL 4 11/20/2014 0942   VLDL 21.0 11/20/2014 0942   LDLCALC 63 11/20/2014 0942      Wt Readings from Last 3 Encounters:  03/12/16 197 lb (89.359 kg)  11/03/15 187 lb 6.4 oz (85.004 kg)  11/20/14 195 lb (88.451 kg)      Other studies Reviewed: Additional studies/ records that were reviewed today include:    Previous myoview   Cath 09/06/2001  OVERALL IMPRESSION: 1. Acute anterior myocardial infarction. 2. Totally occluded left anterior descending. 3. Minimal coronary atherosclerosis otherwise with a large dominant  right coronary artery. 4. Successful stent placement in the proximal left anterior descending with  TIMI grade 3 flow.   Review of the above records demonstrates:   Remote history of MI with stent to LAD in 2002. Has been having annual follow-up with Dr. Aundra Dubin. Also follows Dr. Lindi Adie for MGUS    ASSESSMENT AND PLAN:  1. CAD with remote anterior MI 08/2001: no recurrence of angina, fairly active without any chest discomfort or SOB  2. Hyperlipidemia: obtain fasting lipid, followup in 1 year  3. MGUS follow-up with Dr. Lindi Adie: on observation only given how well he has been doing   Current medicines are reviewed at length with the patient today.  The patient does not have concerns regarding medicines.  The following changes have been made:  no change  Labs/ tests ordered today include:   Orders Placed This Encounter    Procedures  . Lipid panel  . EKG 12-Lead     Disposition:   FU with Dr. Aundra Dubin in 1 year  Signed, Almyra Deforest, [place]  03/12/2016 5:49 PM    [address], [place], [place]  [address] Phone: [phone]; Fax: [phone]

## 2016-03-12 NOTE — Patient Instructions (Addendum)
Medication Instructions:  Your physician recommends that you continue on your current medications as directed. Please refer to the Current Medication list given to you today.   Labs: Your physician recommends that you return for a FASTING lipid profile AT PATIENTS CONVENIENCE   Testing/Procedures: None ordered  Follow-Up: Your physician wants you to follow-up in: [place] will receive a reminder letter in the mail two months in advance. If you don't receive a letter, please call our office to schedule the follow-up appointment.   Any Other Special Instructions Will Be Listed Below (If Applicable).     If you need a refill on your cardiac medications before your next appointment, please call your pharmacy.

## 2016-04-26 ENCOUNTER — Other Ambulatory Visit (INDEPENDENT_AMBULATORY_CARE_PROVIDER_SITE_OTHER): Payer: Medicare Other | Admitting: *Deleted

## 2016-04-26 DIAGNOSIS — I2510 Atherosclerotic heart disease of native coronary artery without angina pectoris: Secondary | ICD-10-CM

## 2016-04-26 DIAGNOSIS — E785 Hyperlipidemia, unspecified: Secondary | ICD-10-CM

## 2016-04-26 LAB — LIPID PANEL
CHOLESTEROL: 123 mg/dL — AB (ref 125–200)
HDL: 38 mg/dL — ABNORMAL LOW (ref >=40)
LDL Cholesterol: 62 mg/dL (ref <130)
TRIGLYCERIDES: 114 mg/dL (ref <150)
Total CHOL/HDL Ratio: 3.2 Ratio (ref <=5.0)
VLDL: 23 mg/dL (ref <30)

## 2016-05-03 ENCOUNTER — Emergency Department: Admit: 2016-05-03 | Payer: PRIVATE HEALTH INSURANCE

## 2016-05-03 ENCOUNTER — Inpatient Hospital Stay
Admit: 2016-05-03 | Discharge: 2016-05-03 | Disposition: A | Discharge status: Home / Self Care | Payer: PRIVATE HEALTH INSURANCE | Attending: Emergency Medicine

## 2016-05-03 ENCOUNTER — Telehealth: Payer: Self-pay | Admitting: Cardiology

## 2016-05-03 DIAGNOSIS — R079 Chest pain, unspecified: Secondary | ICD-10-CM

## 2016-05-03 LAB — METABOLIC PANEL, COMPREHENSIVE
A-G Ratio: 1 — ABNORMAL LOW (ref 1.1–2.2)
ALT (SGPT): 32 U/L (ref 12–78)
AST (SGOT): 27 U/L (ref 15–37)
Albumin: 4.3 g/dL (ref 3.5–5.0)
Alk. phosphatase: 83 U/L (ref 45–117)
Anion gap: 8 mmol/L (ref 5–15)
BUN/Creatinine ratio: 16 (ref 12–20)
BUN: 17 MG/DL (ref 6–20)
Bilirubin, total: 0.4 MG/DL (ref 0.2–1.0)
CO2: 27 mmol/L (ref 21–32)
Calcium: 8.9 MG/DL (ref 8.5–10.1)
Chloride: 103 mmol/L (ref 97–108)
Creatinine: 1.08 MG/DL (ref 0.70–1.30)
GFR est AA: >60 mL/min/{1.73_m2} (ref >60)
GFR est non-AA: >60 mL/min/{1.73_m2} (ref >60)
Globulin: 4.4 g/dL — ABNORMAL HIGH (ref 2.0–4.0)
Glucose: 88 mg/dL (ref 65–100)
Potassium: 4 mmol/L (ref 3.5–5.1)
Protein, total: 8.7 g/dL — ABNORMAL HIGH (ref 6.4–8.2)
Sodium: 138 mmol/L (ref 136–145)

## 2016-05-03 LAB — CBC WITH AUTOMATED DIFF
ABS. BASOPHILS: 0 10*3/uL (ref 0.0–0.1)
ABS. EOSINOPHILS: 0.1 10*3/uL (ref 0.0–0.4)
ABS. LYMPHOCYTES: 0.8 10*3/uL (ref 0.8–3.5)
ABS. MONOCYTES: 0.5 10*3/uL (ref 0.0–1.0)
ABS. NEUTROPHILS: 1.9 10*3/uL (ref 1.8–8.0)
BASOPHILS: 0 % (ref 0–1)
EOSINOPHILS: 4 % (ref 0–7)
HCT: 46.9 % (ref 36.6–50.3)
HGB: 16 g/dL (ref 12.1–17.0)
LYMPHOCYTES: 25 % (ref 12–49)
MCH: 31.3 PG (ref 26.0–34.0)
MCHC: 34.1 g/dL (ref 30.0–36.5)
MCV: 91.6 FL (ref 80.0–99.0)
MONOCYTES: 13 % (ref 5–13)
NEUTROPHILS: 58 % (ref 32–75)
PLATELET: 165 10*3/uL (ref 150–400)
RBC: 5.12 M/uL (ref 4.10–5.70)
RDW: 13.1 % (ref 11.5–14.5)
WBC: 3.4 10*3/uL — ABNORMAL LOW (ref 4.1–11.1)

## 2016-05-03 LAB — EKG, 12 LEAD, INITIAL
Atrial Rate: 59 {beats}/min
Calculated P Axis: 28 degrees
Calculated R Axis: -45 degrees
Calculated T Axis: 47 degrees
P-R Interval: 186 ms
Q-T Interval: 416 ms
QRS Duration: 104 ms
QTC Calculation (Bezet): 411 ms
Ventricular Rate: 59 {beats}/min

## 2016-05-03 LAB — TROPONIN I
Troponin-I, Qt.: <0.04 ng/mL (ref <0.05)
Troponin-I, Qt.: <0.04 ng/mL (ref <0.05)

## 2016-05-03 LAB — CK W/ CKMB & INDEX
CK - MB: 6.5 NG/ML — ABNORMAL HIGH (ref <3.6)
CK-MB Index: 1.9 (ref 0–2.5)
CK: 343 U/L — ABNORMAL HIGH (ref 39–308)

## 2016-05-03 MED ORDER — ASPIRIN 81 MG CHEWABLE TAB
81 mg | ORAL | Status: AC
Start: 2016-05-03 — End: 2016-05-03
  Administered 2016-05-03: 18:00:00 via ORAL

## 2016-05-03 MED ORDER — NITROGLYCERIN 2 % TRANSDERMAL OINTMENT
2 % | TRANSDERMAL | Status: AC
Start: 2016-05-03 — End: 2016-05-03
  Administered 2016-05-03: 18:00:00 via TOPICAL

## 2016-05-03 MED FILL — NITRO-BID 2 % TRANSDERMAL OINTMENT: 2 % | TRANSDERMAL | Qty: 1

## 2016-05-03 MED FILL — BAYER CHEWABLE LOW DOSE ASPIRIN 81 MG TABLET: 81 mg | ORAL | Qty: 2

## 2016-05-03 NOTE — Telephone Encounter (Signed)
Dr Aundra Dubin will see pt this Thursday 05/06/18 at 3 PM.  LM on voice mail for pt to call back to give him appt time for this Thursday.

## 2016-05-03 NOTE — Telephone Encounter (Signed)
I have reviewed this with Dr Aundra Dubin

## 2016-05-03 NOTE — Telephone Encounter (Signed)
Pt states he is currently in the hospital in [place].  Pt states he had chest pain this morning, reported to the hospital in [place], EKG and initial blood test were done and he was told they were okay.  Pt states he is waiting to have second blood test done. Pt states he was told if the second blood test is okay he can leave the hospital but should follow up with Dr Aundra Dubin. Pt states he will not be back in this area until Thursday afternoon.  Pt has already been scheduled to see Cecilie Kicks, NP 05/18/16. Pt is asking if he should have a sooner appt for follow up.

## 2016-05-03 NOTE — Telephone Encounter (Signed)
New Message  Pt was seen @ [facility] in [place], [place] for CP that started this am- 5/15- Pt said he is pain free now but was told to f/u w/ Dr Aundra Dubin- pt was sched appt w/ Mickel Baas on 5/30 but requested to speak w/ RN as well. Please call back and discuss.

## 2016-05-03 NOTE — Telephone Encounter (Signed)
No answer, LM for pt to call the office.

## 2016-05-03 NOTE — ED Notes (Signed)
Pt left ambulatory with discharge instructions out to the waiting room to wait on his ride.

## 2016-05-03 NOTE — ED Triage Notes (Signed)
Mid chest pain since this am, denies sob, denies fever, denies n/v, denies n/v, denies diaphoresis

## 2016-05-03 NOTE — ED Provider Notes (Signed)
HPI Comments: 70 y.o. male with past medical history significant for CAD and MI who presents from home with chief complaint of chest pain. Pt reports he was walking around shopping 5 hours ago when he developed a constant, fluctuating 3/10 L sternal burning sensation accompanied by nausea and slight lightheadedness. He states his sx feel similar to when he had an MI 15 years ago. Pt notes he had a 100% blockage for which he had a stent placed in his anterior descending coronary artery. Pt states he took his regular medications this morning including Crestor, Altace, and 2 81 mg ASA. He notes he has not taken any other medications. Pt reports he [place] in [place], [place], he is in [place] visiting grandchildren, and he is returning tomorrow. Per Pt's wife, Pt saw his cardiologist in [place] last week. Pt denies previous hx of DM. Pt denies SOB and vomiting. There are no other acute medical concerns at this time.    Note written by Janece CanterburyJay F Spangler, Scribe, as dictated by Domingo CockingJeffrey T Keyontae Huckeby, MD 2:08 PM    The history is provided by the patient.        Past Medical History:   Diagnosis Date   ??? CAD (coronary artery disease)    ??? MI, old        Past Surgical History:   Procedure Laterality Date   ??? HX CORONARY STENT PLACEMENT     ??? HX ORTHOPAEDIC      cervical fusion         History reviewed. No pertinent family history.    Social History     Social History   ??? Marital status: N/A     Spouse name: N/A   ??? Number of children: N/A   ??? Years of education: N/A     Occupational History   ??? Not on file.     Social History Main Topics   ??? Smoking status: Not on file   ??? Smokeless tobacco: Not on file   ??? Alcohol use Not on file   ??? Drug use: Not on file   ??? Sexual activity: Not on file     Other Topics Concern   ??? Not on file     Social History Narrative   ??? No narrative on file         ALLERGIES: Review of patient's allergies indicates no known allergies.    Review of Systems   Constitutional: Negative for diaphoresis and fever.    HENT: Negative for facial swelling.    Eyes: Negative for visual disturbance.   Respiratory: Negative for cough and shortness of breath.    Cardiovascular: Positive for chest pain.   Gastrointestinal: Positive for nausea. Negative for abdominal pain and vomiting.   Genitourinary: Negative for dysuria.   Musculoskeletal: Negative for joint swelling.   Skin: Negative for rash.   Neurological: Positive for light-headedness. Negative for headaches.   Hematological: Negative for adenopathy.   Psychiatric/Behavioral: Negative for suicidal ideas.   All other systems reviewed and are negative.      Vitals:    05/03/16 1317 05/03/16 1348   BP: 132/86 132/72   Pulse: (!) 59 61   Resp: 16 16   Temp: 97.7 ??F (36.5 ??C)    SpO2: 99% 99%   Weight: 87.2 kg (192 lb 4 oz)    Height: 5' 11.5" (1.816 m)             Physical Exam   Constitutional: He is oriented to  person, place, and time. He appears well-developed and well-nourished. No distress.   HENT:   Head: Normocephalic and atraumatic.   Mouth/Throat: Oropharynx is clear and moist.   Eyes: Pupils are equal, round, and reactive to light.   Neck: Normal range of motion. Neck supple.   Cardiovascular: Normal rate, regular rhythm, normal heart sounds and intact distal pulses.    Pulmonary/Chest: Effort normal and breath sounds normal. No respiratory distress.   Abdominal: Soft. Bowel sounds are normal. He exhibits no distension. There is no tenderness.   Musculoskeletal: Normal range of motion. He exhibits no edema.   Neurological: He is alert and oriented to person, place, and time.   Skin: Skin is warm and dry.   Nursing note and vitals reviewed.  Note written by Janece Canterbury, Scribe, as dictated by Domingo Cocking, MD 2:08 PM    MDM  Number of Diagnoses or Management Options  Chest pain, unspecified type:   Diagnosis management comments: A:  70yo M from [place] who p/w CP.  Pain constant since 0900 this AM.  Has h/o cad with MI 15 years ago.       DDx:  ACS, GERD/PUD, msk, hiatal hernia, esophageal spasm    P:  ecg  cxr  Labs  asa    ED Course       Procedures    ED EKG interpretation:  Rhythm: normal sinus rhythm. Rate (approx.): 59.  Axis: normal.  ST segment:  No concerning ST elevations or depressions. LAFB.  No prior for comparison.  This EKG was interpreted by Domingo Cocking, MD,ED Provider.    Labs unremarkable.  Trop normal    4:50 PM  Repeat trop normal.      Pt is pain free now.  Had pain for 5 hours prior to coming to ED.  With 2 sets of normal trop, ACS highly unlikely.  Pt is from [place] and plans to return home tomorrow.  He will f/u with his cardiologist there.

## 2016-05-04 NOTE — Telephone Encounter (Signed)
Pt advised appt has been scheduled with Dr Aundra Dubin 05/06/16 at [facility]. Pt states he was given copies of ED records from [facility] to bring with him to appt.

## 2016-05-04 NOTE — Telephone Encounter (Signed)
Voice mail, LMTCB at both numbers listed.

## 2016-05-06 ENCOUNTER — Encounter: Payer: Self-pay | Admitting: Cardiology

## 2016-05-06 ENCOUNTER — Ambulatory Visit (INDEPENDENT_AMBULATORY_CARE_PROVIDER_SITE_OTHER): Payer: Medicare Other | Admitting: Cardiology

## 2016-05-06 VITALS — BP 128/68 | HR 78 | Ht 71.0 in | Wt 190.2 lb

## 2016-05-06 DIAGNOSIS — I25118 Atherosclerotic heart disease of native coronary artery with other forms of angina pectoris: Secondary | ICD-10-CM | POA: Diagnosis not present

## 2016-05-06 DIAGNOSIS — E785 Hyperlipidemia, unspecified: Secondary | ICD-10-CM | POA: Diagnosis not present

## 2016-05-06 DIAGNOSIS — R079 Chest pain, unspecified: Secondary | ICD-10-CM | POA: Diagnosis not present

## 2016-05-06 NOTE — Patient Instructions (Signed)
Medication Instructions:  Your physician recommends that you continue on your current medications as directed. Please refer to the Current Medication list given to you today.   Labwork: None ordered  Testing/Procedures: Your physician has requested that you have an exercise tolerance test. For further information please visit [email or web address]. Please also follow instruction sheet, as given.  Follow-Up: Your physician recommends that you schedule a follow-up appointment in: WILL BE BASED UPON YOUR TEST RESULTS  Exercise Stress Electrocardiogram An exercise stress electrocardiogram is a test to check how blood flows to your heart. It is done to find areas of poor blood flow. You will need to walk on a treadmill for this test. The electrocardiogram will record your heartbeat when you are at rest and when you are exercising. BEFORE THE PROCEDURE  Do not have drinks with caffeine or foods with caffeine for 24 hours before the test, or as told by your doctor. This includes coffee, tea (even decaf tea), sodas, chocolate, and cocoa.  Follow your doctor's instructions about eating and drinking before the test.  Ask your doctor what medicines you should or should not take before the test. Take your medicines with water unless told by your doctor not to.  If you use an inhaler, bring it with you to the test.  Bring a snack to eat after the test.  Do not  smoke for 4 hours before the test.  Do not put lotions, powders, creams, or oils on your chest before the test.  Wear comfortable shoes and clothing. PROCEDURE  You will have patches put on your chest. Small areas of your chest may need to be shaved. Wires will be connected to the patches.  Your heart rate will be watched while you are resting and while you are exercising.  You will walk on the treadmill. The treadmill will slowly get faster to raise your heart rate.  The test will take about 1-2 hours. AFTER THE PROCEDURE  Your  heart rate and blood pressure will be watched after the test.  You may return to your normal diet, activities, and medicines or as told by your doctor.   This information is not intended to replace advice given to you by your health care provider. Make sure you discuss any questions you have with your health care provider.   Document Released: 05/24/2008 Document Revised: 12/27/2014 Document Reviewed: 08/13/2013 Elsevier Interactive Patient Education 2016 [employer].    Any Other Special Instructions Will Be Listed Below (If Applicable).     If you need a refill on your cardiac medications before your next appointment, please call your pharmacy.

## 2016-05-06 NOTE — Progress Notes (Signed)
Patient ID: Eugene Moreno, male   DOB: 1946/05/02, 70 y.o.   MRN: [id number] PCP: Dr. Drema Dallas  70 yo with history of CAD s/p anterior MI in 9/02 presents for cardiology followup.  Last nuclear stress test in 2010 showed no evidence for ischemia. He had been doing well with no anginal symptoms.  Earlier this week, he was visiting in [place], [place].  He was out shopping and developed central chest tightness reminiscent of his prior MI but less severe.  The pain did not resolve over the next few hours, so he went to the ER.  In the ER, the pain eventually resolved.  NTG helped but did not stop it completely (it just went away on its own).  Pain lasted about 7 hrs total.  ECG was unchanged from prior, CXR was clear.  Troponin was negative x 2 several hours apart.  He was allowed to go home with close followup.  He has not had chest pain since then and had not had any before.  He is very active, was up and down ladders yesterday. No exertional dyspnea.  The pain was not related to a meal.  ECG (ER): NSR, LAFB, old ASMI  Labs (6/13): LDL 45, HDL 34 Labs (9/13): K 4.2, creatinine 0.5 Labs (11/15): K 4.3, creatinine 1.0 Labs (11/16): creatinine 0.9 Labs (5/17): LDL 62, HDL 38, HCT 46.9, creatinine 1.08, TnI negative  PMH: 1. CAD: Anterior MI in 9/02 with PCI to LAD.  Cardiolite in 2010 with EF 54%, minimal anterior hypokinesis, no ischemia.  2. Cervical disc disease 3. 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  4. Hyperlipidemia 5. H/o positive ANA  SH: Retired Education officer, museum.  Nonsmoker.  Rare ETOH.  Married, lives in [place].   FH: Father with MI at 25, sister with angioplasty at 16.    Current Outpatient Prescriptions  Medication Sig Dispense Refill  . aspirin EC 81 MG tablet Take 2 tablets (162 mg total) by mouth daily.    . cetirizine (ZYRTEC) 10 MG tablet Take 10 mg by mouth daily.    Marland Kitchen Cod Liver Oil CAPS Take by mouth daily.      . ferrous fumarate (HEMOCYTE -  106 MG FE) 325 (106 FE) MG TABS Take 1 tablet by mouth.    . folic acid (FOLVITE) Q000111Q MCG tablet Take 800 mcg by mouth daily.     . ramipril (ALTACE) 2.5 MG capsule TAKE 1 CAPSULE DAILY 90 capsule 1  . rosuvastatin (CRESTOR) 5 MG tablet TAKE 1 TABLET DAILY 90 tablet 1  . vitamin C (ASCORBIC ACID) 500 MG tablet Take 1,000 mg by mouth daily.     . vitamin E 400 UNIT capsule Take 400 Units by mouth daily.     No current facility-administered medications for this visit.    BP 128/68 mmHg  Pulse 78  Ht 5\' 11"  (1.803 m)  Wt 190 lb 3.2 oz (86.274 kg)  BMI 26.54 kg/m2  SpO2 97% General: NAD Neck: No JVD, no thyromegaly or thyroid nodule.  Lungs: Clear to auscultation bilaterally with normal respiratory effort. CV: Nondisplaced PMI.  Heart regular S1/S2, no S3/S4, no murmur.  No peripheral edema.  No carotid bruit.  Normal pedal pulses.  Abdomen: Soft, nontender, no hepatosplenomegaly, no distention.  Neurologic: Alert and oriented x 3.  Psych: Normal affect. Extremities: No clubbing or cyanosis.   Assessment/Plan: 1. CAD: Patient has history of remote anterior MI.  He had prolonged chest pain about 3 days  ago.  Despite pain for about 7 hrs, troponin was negative x 2.  ECG was unchanged.  He has not had symptoms since despite physical exercise.  I suspect this was noncardiac, but still concerning given his history.   - Continue ASA 81, statin, ramipril.  - I am going to have him get an ETT-Cardiolite, will arrange today.  - If prolonged pain recurs prior to Cardiolite, needs to go back to ER.                           2. Hyperlipidemia: Good lipids recently.    Followup in 1 year if Cardiolite is normal.   Loralie Champagne 05/06/2016

## 2016-05-07 ENCOUNTER — Other Ambulatory Visit: Payer: Self-pay | Admitting: *Deleted

## 2016-05-07 DIAGNOSIS — R079 Chest pain, unspecified: Secondary | ICD-10-CM

## 2016-05-10 ENCOUNTER — Ambulatory Visit (HOSPITAL_COMMUNITY): Payer: Medicare Other | Attending: Cardiology

## 2016-05-10 DIAGNOSIS — Z8249 Family history of ischemic heart disease and other diseases of the circulatory system: Secondary | ICD-10-CM | POA: Insufficient documentation

## 2016-05-10 DIAGNOSIS — I2510 Atherosclerotic heart disease of native coronary artery without angina pectoris: Secondary | ICD-10-CM | POA: Diagnosis not present

## 2016-05-10 DIAGNOSIS — R079 Chest pain, unspecified: Secondary | ICD-10-CM

## 2016-05-10 DIAGNOSIS — R9439 Abnormal result of other cardiovascular function study: Secondary | ICD-10-CM | POA: Diagnosis not present

## 2016-05-10 LAB — MYOCARDIAL PERFUSION IMAGING
CSEPEW: 13.4 METS
CSEPHR: 92 %
CSEPPHR: 139 {beats}/min
Exercise duration (min): 11 min
Exercise duration (sec): 0 s
LHR: 0.38
LVDIAVOL: 113 mL (ref 62–150)
LVSYSVOL: 63 mL
MPHR: 151 {beats}/min
Rest HR: 54 {beats}/min
SDS: 3
SRS: 10
SSS: 13
TID: 0.93

## 2016-05-10 MED ORDER — TECHNETIUM TC 99M TETROFOSMIN IV KIT
32.1000 | PACK | Freq: Once | INTRAVENOUS | Status: AC | PRN
  Administered 2016-05-10: 32.1 via INTRAVENOUS
  Filled 2016-05-10: qty 32

## 2016-05-10 MED ORDER — TECHNETIUM TC 99M TETROFOSMIN IV KIT
10.7000 | PACK | Freq: Once | INTRAVENOUS | Status: AC | PRN
Start: 2016-05-10 — End: 2016-05-10
  Administered 2016-05-10: 11 via INTRAVENOUS
  Filled 2016-05-10: qty 11

## 2016-05-18 ENCOUNTER — Ambulatory Visit: Payer: Medicare Other | Admitting: Cardiology

## 2016-06-15 ENCOUNTER — Other Ambulatory Visit: Payer: Self-pay | Admitting: Cardiology

## 2016-07-09 ENCOUNTER — Telehealth: Payer: Self-pay | Admitting: Hematology and Oncology

## 2016-07-09 NOTE — Telephone Encounter (Signed)
Called patient to confirm appointment. Left voice message. Appointment letter and schedule mailed. Maria F. °

## 2016-10-06 ENCOUNTER — Other Ambulatory Visit: Payer: Self-pay | Admitting: Cardiology

## 2016-10-26 ENCOUNTER — Other Ambulatory Visit: Payer: Self-pay | Admitting: Neurosurgery

## 2016-10-26 DIAGNOSIS — M5412 Radiculopathy, cervical region: Secondary | ICD-10-CM

## 2016-11-04 ENCOUNTER — Other Ambulatory Visit: Payer: Self-pay

## 2016-11-04 ENCOUNTER — Other Ambulatory Visit: Payer: Medicare Other

## 2016-11-04 ENCOUNTER — Ambulatory Visit: Payer: Medicare Other | Admitting: Hematology and Oncology

## 2016-11-04 DIAGNOSIS — D472 Monoclonal gammopathy: Secondary | ICD-10-CM

## 2016-11-05 ENCOUNTER — Encounter: Payer: Self-pay | Admitting: Hematology and Oncology

## 2016-11-05 ENCOUNTER — Ambulatory Visit (HOSPITAL_BASED_OUTPATIENT_CLINIC_OR_DEPARTMENT_OTHER): Payer: Medicare Other | Admitting: Hematology and Oncology

## 2016-11-05 ENCOUNTER — Other Ambulatory Visit: Payer: Self-pay

## 2016-11-05 ENCOUNTER — Other Ambulatory Visit (HOSPITAL_BASED_OUTPATIENT_CLINIC_OR_DEPARTMENT_OTHER): Payer: Medicare Other

## 2016-11-05 DIAGNOSIS — D472 Monoclonal gammopathy: Secondary | ICD-10-CM

## 2016-11-05 LAB — COMPREHENSIVE METABOLIC PANEL
ALBUMIN: 4.1 g/dL (ref 3.5–5.0)
ALK PHOS: 80 U/L (ref 40–150)
ALT: 33 U/L (ref 0–55)
AST: 27 U/L (ref 5–34)
Anion Gap: 9 mEq/L (ref 3–11)
BUN: 16.2 mg/dL (ref 7.0–26.0)
CHLORIDE: 104 meq/L (ref 98–109)
CO2: 26 mEq/L (ref 22–29)
Calcium: 9.8 mg/dL (ref 8.4–10.4)
Creatinine: 0.9 mg/dL (ref 0.7–1.3)
EGFR: 83 mL/min/{1.73_m2} — AB (ref >90)
GLUCOSE: 88 mg/dL (ref 70–140)
POTASSIUM: 4.7 meq/L (ref 3.5–5.1)
SODIUM: 139 meq/L (ref 136–145)
Total Bilirubin: 0.7 mg/dL (ref 0.20–1.20)
Total Protein: 8.1 g/dL (ref 6.4–8.3)

## 2016-11-05 LAB — CBC WITH DIFFERENTIAL/PLATELET
BASO%: 0.3 % (ref 0.0–2.0)
BASOS ABS: 0 10*3/uL (ref 0.0–0.1)
EOS ABS: 0.1 10*3/uL (ref 0.0–0.5)
EOS%: 2.3 % (ref 0.0–7.0)
HCT: 43.4 % (ref 38.4–49.9)
HEMOGLOBIN: 14.9 g/dL (ref 13.0–17.1)
LYMPH%: 18.8 % (ref 14.0–49.0)
MCH: 31.5 pg (ref 27.2–33.4)
MCHC: 34.3 g/dL (ref 32.0–36.0)
MCV: 91.8 fL (ref 79.3–98.0)
MONO#: 0.6 10*3/uL (ref 0.1–0.9)
MONO%: 14.4 % — AB (ref 0.0–14.0)
NEUT#: 2.5 10*3/uL (ref 1.5–6.5)
NEUT%: 64.2 % (ref 39.0–75.0)
Platelets: 149 10*3/uL (ref 140–400)
RBC: 4.73 10*6/uL (ref 4.20–5.82)
RDW: 12.6 % (ref 11.0–14.6)
WBC: 3.9 10*3/uL — ABNORMAL LOW (ref 4.0–10.3)
lymph#: 0.7 10*3/uL — ABNORMAL LOW (ref 0.9–3.3)

## 2016-11-05 NOTE — Progress Notes (Signed)
Patient Care Team: Leighton Ruff, MD as PCP - General (Family Medicine)  DIAGNOSIS:  Encounter Diagnosis  Name Primary?  Marland Kitchen MGUS (monoclonal gammopathy of unknown significance)    CHIEF COMPLIANT: Follow-up of MGUS  INTERVAL HISTORY: Eugene Moreno is a 70 year old with above-mentioned history of MGUS who is here for annual follow-up. He reports no problems or concerns. He denies any new symptoms or health issues. He has chronic neck problems. He had been traveling in what spent a month in [place].  REVIEW OF SYSTEMS:   Constitutional: Denies fevers, chills or abnormal weight loss Eyes: Denies blurriness of vision Ears, nose, mouth, throat, and face: Denies mucositis or sore throat Respiratory: Denies cough, dyspnea or wheezes Cardiovascular: Denies palpitation, chest discomfort Gastrointestinal:  Denies nausea, heartburn or change in bowel habits Skin: Denies abnormal skin rashes Lymphatics: Denies new lymphadenopathy or easy bruising Neurological:Denies numbness, tingling or new weaknesses Behavioral/Psych: Mood is stable, no new changes  Extremities: No lower extremity edema All other systems were reviewed with the patient and are negative.  I have reviewed the past medical history, past surgical history, social history and family history with the patient and they are unchanged from previous note.  ALLERGIES:  has No Known Allergies.  MEDICATIONS:  Current Outpatient Prescriptions  Medication Sig Dispense Refill  . aspirin EC 81 MG tablet Take 2 tablets (162 mg total) by mouth daily.    . cetirizine (ZYRTEC) 10 MG tablet Take 10 mg by mouth daily.    Marland Kitchen Cod Liver Oil CAPS Take by mouth daily.      . ferrous fumarate (HEMOCYTE - 106 MG FE) 325 (106 FE) MG TABS Take 1 tablet by mouth.    . folic acid (FOLVITE) Q000111Q MCG tablet Take 800 mcg by mouth daily.     . ramipril (ALTACE) 2.5 MG capsule Take 1 capsule (2.5 mg total) by mouth daily. 90 capsule 3  . rosuvastatin  (CRESTOR) 5 MG tablet Take 1 tablet (5 mg total) by mouth daily. 90 tablet 1  . vitamin C (ASCORBIC ACID) 500 MG tablet Take 1,000 mg by mouth daily.     . vitamin E 400 UNIT capsule Take 400 Units by mouth daily.     No current facility-administered medications for this visit.     PHYSICAL EXAMINATION: ECOG PERFORMANCE STATUS: 1 - Symptomatic but completely ambulatory  Vitals:   11/05/16 0959  BP: 118/72  Pulse: 66  Resp: 18  Temp: 97.9 F (36.6 C)   Filed Weights   11/05/16 0959  Weight: 187 lb 1.6 oz (84.9 kg)    GENERAL:alert, no distress and comfortable SKIN: skin color, texture, turgor are normal, no rashes or significant lesions EYES: normal, Conjunctiva are pink and non-injected, sclera clear OROPHARYNX:no exudate, no erythema and lips, buccal mucosa, and tongue normal  NECK: supple, thyroid normal size, non-tender, without nodularity LYMPH:  no palpable lymphadenopathy in the cervical, axillary or inguinal LUNGS: clear to auscultation and percussion with normal breathing effort HEART: regular rate & rhythm and no murmurs and no lower extremity edema ABDOMEN:abdomen soft, non-tender and normal bowel sounds MUSCULOSKELETAL:no cyanosis of digits and no clubbing  NEURO: alert & oriented x 3 with fluent speech, no focal motor/sensory deficits EXTREMITIES: No lower extremity edema  LABORATORY DATA:  I have reviewed the data as listed   Chemistry      Component Value Date/Time   NA 141 11/03/2015 1141   K 4.4 11/03/2015 1141   CL 105 11/20/2014 0942  CL 108 (H) 09/14/2012 0949   CO2 28 11/03/2015 1141   BUN 14.1 11/03/2015 1141   CREATININE 0.9 11/03/2015 1141      Component Value Date/Time   CALCIUM 9.5 11/03/2015 1141   ALKPHOS 75 11/03/2015 1141   AST 24 11/03/2015 1141   ALT 26 11/03/2015 1141   BILITOT 0.54 11/03/2015 1141       Lab Results  Component Value Date   WBC 3.9 (L) 11/05/2016   HGB 14.9 11/05/2016   HCT 43.4 11/05/2016   MCV 91.8  11/05/2016   PLT 149 11/05/2016   NEUTROABS 2.5 11/05/2016     ASSESSMENT & PLAN:  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 This is same as before for the past 3 years. Lab review: 2016 WBC: 3, ALC 0.6 SPEP: Faint restricted band IgG: 1790 Hb: 14.7  Patient does not have any sequelae from MGUS or leukopenia. Patient travels around the world accompanied by his wife. (Patient was recently in [place]) Recommendation: 1 year follow-up with labs. Patient would like to follow with his primary care physician. I sent a message to Dr. Drema Dallas to see if she would get annual serum protein for pheresis with immunofixation along with IgG levels. If his M protein goes above 1 g if he develop symptoms of anemia or hypercalcemia, I would need to see him sooner.  Return to clinic on an as-needed basis. No orders of the defined types were placed in this encounter.  The patient has a good understanding of the overall plan. he agrees with it. he will call with any problems that may develop before the next visit here.   Eugene Eisenmenger, MD 11/05/16

## 2016-11-05 NOTE — Assessment & Plan Note (Signed)
MGUS and leukopenia diagnosed in 2013 Current treatment: Observation Review of blood work: CBC reveals white count 3.0 with a lymphocyte count of 0.6. This is same as before or the past 3 years. Lab review: 2016 WBC: 3, ALC 0.6 SPEP: Faint restricted band IgG: 1790 Hb: 14.7  Patient does not have any sequelae from MGUS or leukopenia. Patient travels around the world accompanied by his wife. (Patient was recently in [place] and [place]) Recommendation: 1 year follow-up with labs vs follow with his primary care physician

## 2016-11-06 LAB — BETA 2 MICROGLOBULIN, SERUM: Beta-2: 2.5 mg/L — ABNORMAL HIGH (ref 0.6–2.4)

## 2016-11-08 LAB — KAPPA/LAMBDA LIGHT CHAINS
IG LAMBDA FREE LIGHT CHAIN: 14.6 mg/L (ref 5.7–26.3)
Ig Kappa Free Light Chain: 18.2 mg/L (ref 3.3–19.4)
KAPPA/LAMBDA FLC RATIO: 1.25 (ref 0.26–1.65)

## 2016-11-09 ENCOUNTER — Telehealth: Payer: Self-pay | Admitting: Hematology and Oncology

## 2016-11-09 LAB — MULTIPLE MYELOMA PANEL, SERUM
ALBUMIN/GLOB SERPL: 1.1 (ref 0.7–1.7)
ALPHA2 GLOB SERPL ELPH-MCNC: 0.6 g/dL (ref 0.4–1.0)
Albumin SerPl Elph-Mcnc: 3.7 g/dL (ref 2.9–4.4)
Alpha 1: 0.2 g/dL (ref 0.0–0.4)
B-Globulin SerPl Elph-Mcnc: 0.8 g/dL (ref 0.7–1.3)
Gamma Glob SerPl Elph-Mcnc: 1.7 g/dL (ref 0.4–1.8)
Globulin, Total: 3.4 g/dL (ref 2.2–3.9)
IGA/IMMUNOGLOBULIN A, SERUM: 125 mg/dL (ref 61–437)
IGM (IMMUNOGLOBIN M), SRM: 33 mg/dL (ref 20–172)
TOTAL PROTEIN: 7.1 g/dL (ref 6.0–8.5)

## 2016-11-09 NOTE — Telephone Encounter (Signed)
Called and left a message on the patient's home phone that he does not have any monoclonal protein. There is no concern for MGUS at this time. Patient will follow with Dr. Drema Dallas for future evaluations of serum protein electro phoresis.

## 2017-04-04 ENCOUNTER — Other Ambulatory Visit: Payer: Self-pay | Admitting: Cardiology

## 2017-06-10 ENCOUNTER — Other Ambulatory Visit: Payer: Self-pay | Admitting: Cardiology

## 2017-07-04 ENCOUNTER — Other Ambulatory Visit: Payer: Self-pay | Admitting: Cardiology

## 2017-07-28 ENCOUNTER — Telehealth: Payer: Self-pay | Admitting: Cardiology

## 2017-07-28 MED ORDER — ROSUVASTATIN CALCIUM 5 MG PO TABS: 5.0000 mg | ORAL_TABLET | Freq: Every day | ORAL | 0 refills | Status: DC

## 2017-07-28 NOTE — Telephone Encounter (Signed)
New message     *STAT* If patient is at the pharmacy, call can be transferred to refill team.   1. Which medications need to be refilled? (please list name of each medication and dose if known) crestor 5 mg  2. Which pharmacy/location (including street and city if local pharmacy) is medication to be sent to? [facility] [address]  3. Do they need a 30 day or 90 day supply? 30 day

## 2017-09-20 ENCOUNTER — Encounter: Payer: Self-pay | Admitting: Cardiology

## 2017-09-20 ENCOUNTER — Ambulatory Visit (INDEPENDENT_AMBULATORY_CARE_PROVIDER_SITE_OTHER): Payer: Medicare Other | Admitting: Cardiology

## 2017-09-20 VITALS — BP 106/70 | HR 57 | Ht 71.0 in | Wt 196.4 lb

## 2017-09-20 DIAGNOSIS — E7800 Pure hypercholesterolemia, unspecified: Secondary | ICD-10-CM

## 2017-09-20 DIAGNOSIS — I2510 Atherosclerotic heart disease of native coronary artery without angina pectoris: Secondary | ICD-10-CM | POA: Diagnosis not present

## 2017-09-20 MED ORDER — ROSUVASTATIN CALCIUM 5 MG PO TABS: 5.0000 mg | ORAL_TABLET | Freq: Every day | ORAL | 3 refills | Status: DC

## 2017-09-20 NOTE — Patient Instructions (Signed)

## 2017-09-20 NOTE — Progress Notes (Signed)
Cardiology Office Note:    Date:  09/20/2017   ID:  Eugene Moreno, DOB [birth year], MRN [id number]  PCP:  Leighton Ruff, MD  Cardiologist:  Fransico Him, MD   Referring MD: Leighton Ruff, MD   Chief Complaint  Patient presents with  . Coronary Artery Disease  . Atrial Fibrillation    History of Present Illness:    Eugene Moreno is a 71 y.o. 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  He is here today for followup and is doing well.  He denies any chest pain or pressure, SOB, DOE, PND, orthopnea, LE edema, dizziness, palpitations or syncope. He is compliant with his meds and is tolerating meds with no SE.    Past Medical History:  Diagnosis Date  . Herniated disc    history of  . Hyperlipemia    tolerating crestor and niacin  . Leukopenia    history of, with positive ANA, stable  . 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 MI (myocardial infarction) ([place])    remote anterior, with last stress test 09/2010  . Neutropenia ([place]) 09/14/2012    Past Surgical History:  Procedure Laterality Date  . ANTERIOR CERVICAL DISCECTOMY  01-26-2002   C6-C7,  with a chronic C7 radiculopathy, decompression of the C7 nerve root, bone graft, plate.  Microscope   . CARDIAC CATHETERIZATION  09-06-2001  . FINGER AMPUTATION     of "little finger", traumatic   . VASECTOMY    . WISDOM TOOTH EXTRACTION      Current Medications: Current Meds  Medication Sig  . aspirin EC 81 MG tablet Take 2 tablets (162 mg total) by mouth daily.  . cetirizine (ZYRTEC) 10 MG tablet Take 10 mg by mouth daily.  Marland Kitchen Cod Liver Oil CAPS Take by mouth daily.    . ferrous fumarate (HEMOCYTE - 106 MG FE) 325 (106 FE) MG TABS Take 1 tablet by mouth.  . folic acid (FOLVITE) 993 MCG tablet Take 800 mcg by mouth daily.   . ramipril (ALTACE) 2.5 MG capsule TAKE 1 CAPSULE DAILY  . rosuvastatin (CRESTOR) 5  MG tablet Take 1 tablet (5 mg total) by mouth at bedtime. PLEASE KEEP 09/20/17 APPT FOR FURTHER REFILLS  . vitamin C (ASCORBIC ACID) 500 MG tablet Take 1,000 mg by mouth daily.   . vitamin E 400 UNIT capsule Take 400 Units by mouth daily.     Allergies:   Niacin and related   Social History   Social History  . Marital status: Married    Spouse name: N/A  . Number of children: N/A  . Years of education: N/A   Occupational History  . retired Archivist   Social History Main Topics  . Smoking status: Never Smoker  . Smokeless tobacco: Never Used  . Alcohol use Yes     Comment: rarely  . Drug use: No  . Sexual activity: Yes   Other Topics Concern  . None   Social History Narrative  . None     Family History: The patient's family history includes Breast cancer (age of onset: 103) in his mother; Heart attack in his father; Other (age of onset: 56) in his sister.  ROS:   Please see the history of present illness.    ROS  All other systems reviewed and negative.   EKGs/Labs/Other Studies Reviewed:  The following studies were reviewed today: none  EKG:  EKG is  ordered today.  The ekg ordered today demonstrates sinus bradycardia at 57bpm with LAFB and septal infarct with IRBBB  Recent Labs: 11/05/2016: ALT 33; BUN 16.2; Creatinine 0.9; HGB 14.9; Platelets 149; Potassium 4.7; Sodium 139   Recent Lipid Panel    Component Value Date/Time   CHOL 123 (L) 04/26/2016 0942   TRIG 114 04/26/2016 0942   HDL 38 (L) 04/26/2016 0942   CHOLHDL 3.2 04/26/2016 0942   VLDL 23 04/26/2016 0942   LDLCALC 62 04/26/2016 0942    Physical Exam:    VS:  BP 106/70   Pulse (!) 57   Ht 5\' 11"  (1.803 m)   Wt 196 lb 6.4 oz (89.1 kg)   BMI 27.39 kg/m     Wt Readings from Last 3 Encounters:  09/20/17 196 lb 6.4 oz (89.1 kg)  11/05/16 187 lb 1.6 oz (84.9 kg)  05/10/16 190 lb (86.2 kg)     GEN:  Well nourished, well developed in no acute distress HEENT:  Normal NECK: No JVD; No carotid bruits LYMPHATICS: No lymphadenopathy CARDIAC: RRR, no murmurs, rubs, gallops RESPIRATORY:  Clear to auscultation without rales, wheezing or rhonchi  ABDOMEN: Soft, non-tender, non-distended MUSCULOSKELETAL:  No edema; No deformity  SKIN: Warm and dry NEUROLOGIC:  Alert and oriented x 3 PSYCHIATRIC:  Normal affect   ASSESSMENT:    1. Coronary artery disease involving native coronary artery of native heart without angina pectoris   2. Pure hypercholesterolemia    PLAN:    In order of problems listed above:  1.  ASCAD - s/p remote anterior MI with PCI of LAD.  He has no anginal symptoms and a nuclear stress test a year ago showed no ischemia.  He will continue on ASA 81mg  daily and statin.    2.  Hyperlipidemia with LDL goal < 70.  He will continue on Crestor 5mg  daily. I will get a copy of his most recent lipids from PCP.     Medication Adjustments/Labs and Tests Ordered: Current medicines are reviewed at length with the patient today.  Concerns regarding medicines are outlined above.  No orders of the defined types were placed in this encounter.  No orders of the defined types were placed in this encounter.   Signed, Fransico Him, MD  09/20/2017 9:03 AM    [place]

## 2017-10-23 ENCOUNTER — Other Ambulatory Visit: Payer: Self-pay | Admitting: Cardiology

## 2018-06-05 ENCOUNTER — Other Ambulatory Visit: Payer: Self-pay | Admitting: Cardiology

## 2018-09-18 ENCOUNTER — Encounter: Payer: Self-pay | Admitting: Cardiology

## 2018-09-18 ENCOUNTER — Ambulatory Visit (INDEPENDENT_AMBULATORY_CARE_PROVIDER_SITE_OTHER): Payer: Medicare Other | Admitting: Cardiology

## 2018-09-18 VITALS — BP 112/76 | HR 56 | Ht 71.0 in | Wt 199.8 lb

## 2018-09-18 DIAGNOSIS — E7800 Pure hypercholesterolemia, unspecified: Secondary | ICD-10-CM | POA: Diagnosis not present

## 2018-09-18 DIAGNOSIS — I2510 Atherosclerotic heart disease of native coronary artery without angina pectoris: Secondary | ICD-10-CM

## 2018-09-18 LAB — HEPATIC FUNCTION PANEL
ALT: 25 IU/L (ref 0–44)
AST: 20 IU/L (ref 0–40)
Albumin: 4.4 g/dL (ref 3.5–4.8)
Alkaline Phosphatase: 76 IU/L (ref 39–117)
BILIRUBIN, DIRECT: 0.12 mg/dL (ref 0.00–0.40)
Bilirubin Total: 0.4 mg/dL (ref 0.0–1.2)
Total Protein: 7.3 g/dL (ref 6.0–8.5)

## 2018-09-18 LAB — LIPID PANEL
CHOLESTEROL TOTAL: 120 mg/dL (ref 100–199)
Chol/HDL Ratio: 3.4 ratio (ref 0.0–5.0)
HDL: 35 mg/dL — ABNORMAL LOW (ref >39)
LDL Calculated: 58 mg/dL (ref 0–99)
Triglycerides: 135 mg/dL (ref 0–149)
VLDL CHOLESTEROL CAL: 27 mg/dL (ref 5–40)

## 2018-09-18 NOTE — Patient Instructions (Signed)
Medication Instructions:  Your physician recommends that you continue on your current medications as directed. Please refer to the Current Medication list given to you today.   Labwork: Today: LFT and Lipid   Follow-Up: Your physician wants you to follow-up in: 1 year with Dr. Radford Pax. You will receive a reminder letter in the mail two months in advance. If you don't receive a letter, please call our office to schedule the follow-up appointment.  If you need a refill on your cardiac medications before your next appointment, please call your pharmacy.

## 2018-09-18 NOTE — Progress Notes (Signed)
Cardiology Office Note:    Date:  09/18/2018   ID:  Eugene Moreno, DOB [birth year], MRN [id number]  PCP:  Leighton Ruff, MD  Cardiologist:  No primary care provider on file.    Referring MD: Leighton Ruff, MD   Chief Complaint  Patient presents with  . Coronary Artery Disease  . Hyperlipidemia    History of Present Illness:    Eugene Moreno is a 72 y.o. 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  He is here today for followup and is doing well.  He denies any chest pain or pressure, SOB, DOE, PND, orthopnea, LE edema, dizziness, palpitations or syncope. He is compliant with his meds and is tolerating meds with no SE.    Past Medical History:  Diagnosis Date  . Herniated disc    history of  . Hyperlipemia    tolerating crestor and niacin  . Leukopenia    history of, with positive ANA, stable  . 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 MI (myocardial infarction) ([place])    remote anterior, with last stress test 09/2010  . Neutropenia ([place]) 09/14/2012    Past Surgical History:  Procedure Laterality Date  . ANTERIOR CERVICAL DISCECTOMY  01-26-2002   C6-C7,  with a chronic C7 radiculopathy, decompression of the C7 nerve root, bone graft, plate.  Microscope   . CARDIAC CATHETERIZATION  09-06-2001  . FINGER AMPUTATION     of "little finger", traumatic   . VASECTOMY    . WISDOM TOOTH EXTRACTION      Current Medications: Current Meds  Medication Sig  . aspirin EC 81 MG tablet Take 2 tablets (162 mg total) by mouth daily.  Marland Kitchen Cod Liver Oil CAPS Take by mouth daily.    . ferrous fumarate (HEMOCYTE - 106 MG FE) 325 (106 FE) MG TABS Take 1 tablet by mouth.  . fexofenadine (ALLEGRA) 60 MG tablet Take 60 mg by mouth 2 (two) times daily.  . folic acid (FOLVITE) 627 MCG tablet Take 800 mcg by mouth daily.   . ramipril (ALTACE) 2.5 MG capsule TAKE 1 CAPSULE DAILY   . rosuvastatin (CRESTOR) 5 MG tablet Take 1 tablet (5 mg total) by mouth at bedtime.  . vitamin C (ASCORBIC ACID) 500 MG tablet Take 1,000 mg by mouth daily.   . vitamin E 400 UNIT capsule Take 400 Units by mouth daily.     Allergies:   Niacin and related   Social History   Socioeconomic History  . Marital status: Married    Spouse name: Not on file  . Number of children: Not on file  . Years of education: Not on file  . Highest education level: Not on file  Occupational History  . 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 Financial resource strain: Not on file  . Food insecurity:    Worry: Not on file    Inability: Not on file  . Transportation needs:    Medical: Not on file    Non-medical: Not on file  Tobacco Use  . Smoking status: Never Smoker  . Smokeless tobacco: Never Used  Substance and Sexual Activity  . Alcohol use: Yes    Comment: rarely  . Drug use: No  . Sexual activity: Yes  Lifestyle  . Physical activity:  Days per week: Not on file    Minutes per session: Not on file  . Stress: Not on file  Relationships  . Social connections:    Talks on phone: Not on file    Gets together: Not on file    Attends religious service: Not on file    Active member of club or organization: Not on file    Attends meetings of clubs or organizations: Not on file    Relationship status: Not on file  Other Topics Concern  . Not on file  Social History Narrative  . Not on file     Family History: The patient's family history includes Breast cancer (age of onset: 91) in his mother; Heart attack in his father; Other (age of onset: 64) in his sister.  ROS:   Please see the history of present illness.    ROS  All other systems reviewed and negative.   EKGs/Labs/Other Studies Reviewed:    The following studies were reviewed today: none  EKG:  EKG is  ordered today.  The ekg ordered today demonstrates sinus  bradycardia 56 bpm with left anterior fascicular block and LVH by voltage criteria with no ST changes.  Recent Labs: No results found for requested labs within last 8760 hours.   Recent Lipid Panel    Component Value Date/Time   CHOL 123 (L) 04/26/2016 0942   TRIG 114 04/26/2016 0942   HDL 38 (L) 04/26/2016 0942   CHOLHDL 3.2 04/26/2016 0942   VLDL 23 04/26/2016 0942   LDLCALC 62 04/26/2016 0942    Physical Exam:    VS:  BP 112/76   Pulse (!) 56   Ht 5\' 11"  (1.803 m)   Wt 199 lb 12.8 oz (90.6 kg)   SpO2 92%   BMI 27.87 kg/m     Wt Readings from Last 3 Encounters:  09/18/18 199 lb 12.8 oz (90.6 kg)  09/20/17 196 lb 6.4 oz (89.1 kg)  11/05/16 187 lb 1.6 oz (84.9 kg)     GEN:  Well nourished, well developed in no acute distress HEENT: Normal NECK: No JVD; No carotid bruits LYMPHATICS: No lymphadenopathy CARDIAC: RRR, no murmurs, rubs, gallops RESPIRATORY:  Clear to auscultation without rales, wheezing or rhonchi  ABDOMEN: Soft, non-tender, non-distended MUSCULOSKELETAL:  No edema; No deformity  SKIN: Warm and dry NEUROLOGIC:  Alert and oriented x 3 PSYCHIATRIC:  Normal affect   ASSESSMENT:    1. Coronary artery disease involving native coronary artery of native heart without angina pectoris   2. Pure hypercholesterolemia    PLAN:    In order of problems listed above:  1.  ASCAD - s/p AWMI 2002 s/p PCI of the LAD with last nuclear stress test 04/2016 with anterior scar but no ischemia.  He denies any anginal symptoms.  He will continue on aspirin 81 mg daily and statin.  2.  Hyperlipidemia with LDL goal < 70.  He will continue on Crestor 5 mg daily.  He is due for FLP and ALT.   Medication Adjustments/Labs and Tests Ordered: Current medicines are reviewed at length with the patient today.  Concerns regarding medicines are outlined above.  Orders Placed This Encounter  Procedures  . Hepatic function panel  . Lipid panel  . EKG 12-Lead   No orders of the  defined types were placed in this encounter.   Signed, Fransico Him, MD  09/18/2018 9:57 AM    [place]

## 2018-09-30 ENCOUNTER — Other Ambulatory Visit: Payer: Self-pay | Admitting: Cardiology

## 2018-12-02 ENCOUNTER — Other Ambulatory Visit: Payer: Self-pay | Admitting: Cardiology

## 2018-12-20 HISTORY — PX: COLONOSCOPY: SHX174

## 2019-08-13 ENCOUNTER — Other Ambulatory Visit: Payer: Self-pay | Admitting: Cardiology

## 2019-08-13 MED ORDER — RAMIPRIL 2.5 MG PO CAPS: 2.5000 mg | ORAL_CAPSULE | Freq: Every day | ORAL | 0 refills | Status: DC

## 2019-08-13 NOTE — Telephone Encounter (Signed)
Pt's medication was sent to pt's pharmacy as requested. Confirmation received.  °

## 2019-08-13 NOTE — Telephone Encounter (Signed)
 *  STAT* If patient is at the pharmacy, call can be transferred to refill team.   1. Which medications need to be refilled? (please list name of each medication and dose if known) ramipril (ALTACE) 2.5 MG capsule  2. Which pharmacy/location (including street and city if local pharmacy) is medication to be sent to? [facility]   3. Do they need a 30 day or 90 day supply? 90  Patient is out of medication and is requesting a temporary prescription be sent to [facility] on [address] to hold him over until his prescription comes in from [place].

## 2019-09-26 ENCOUNTER — Other Ambulatory Visit: Payer: Self-pay

## 2019-09-26 ENCOUNTER — Encounter: Payer: Self-pay | Admitting: Cardiology

## 2019-09-26 ENCOUNTER — Ambulatory Visit (INDEPENDENT_AMBULATORY_CARE_PROVIDER_SITE_OTHER): Payer: Medicare Other | Admitting: Cardiology

## 2019-09-26 VITALS — BP 118/78 | HR 60 | Ht 71.0 in | Wt 197.0 lb

## 2019-09-26 DIAGNOSIS — I10 Essential (primary) hypertension: Secondary | ICD-10-CM

## 2019-09-26 DIAGNOSIS — E7800 Pure hypercholesterolemia, unspecified: Secondary | ICD-10-CM

## 2019-09-26 DIAGNOSIS — I2510 Atherosclerotic heart disease of native coronary artery without angina pectoris: Secondary | ICD-10-CM

## 2019-09-26 NOTE — Patient Instructions (Signed)
Medication Instructions:   Your physician recommends that you continue on your current medications as directed. Please refer to the Current Medication list given to you today.  If you need a refill on your cardiac medications before your next appointment, please call your pharmacy.    Lab work:   IN December 2020 TO CHECK A CMET AND LIPIDS--PLEASE COME FASTING TO THIS LAB APPOINTMENT  If you have labs (blood work) drawn today and your tests are completely normal, you will receive your results only by: Marland Kitchen MyChart Message (if you have MyChart) OR . A paper copy in the mail If you have any lab test that is abnormal or we need to change your treatment, we will call you to review the results.    Follow-Up: At [facility], you and your health needs are our priority.  As part of our continuing mission to provide you with exceptional heart care, we have created designated Provider Care Teams.  These Care Teams include your primary Cardiologist (physician) and Advanced Practice Providers (APPs -  Physician Assistants and Nurse Practitioners) who all work together to provide you with the care you need, when you need it. You will need a follow up appointment in 12 months.  Please call our office 2 months in advance to schedule this appointment.  You may see Dr. Radford Pax or one of the following Advanced Practice Providers on your designated Care Team:   [place], PA-C Melina Copa, PA-C . Ermalinda Barrios, PA-C

## 2019-09-26 NOTE — Progress Notes (Signed)
Cardiology Office Note:    Date:  09/26/2019   ID:  Eugene Moreno, DOB October 30, 1946, MRN [id number]  PCP:  Leighton Ruff, MD  Cardiologist:  No primary care provider on file.    Referring MD: Leighton Ruff, MD   Chief Complaint  Patient presents with  . Coronary Artery Disease  . Hyperlipidemia    History of Present Illness:    Eugene Moreno is a 73 y.o. 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  He is here today for followup and is doing well.  He denies any chest pain or pressure, SOB, DOE, PND, orthopnea, LE edema, dizziness, palpitations or syncope. He is compliant with his meds and is tolerating meds with no SE.    Past Medical History:  Diagnosis Date  . Herniated disc    history of  . Hyperlipemia    tolerating crestor and niacin  . Leukopenia    history of, with positive ANA, stable  . 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 MI (myocardial infarction) ([place])    remote anterior, with last stress test 09/2010  . Neutropenia ([place]) 09/14/2012    Past Surgical History:  Procedure Laterality Date  . ANTERIOR CERVICAL DISCECTOMY  01-26-2002   C6-C7,  with a chronic C7 radiculopathy, decompression of the C7 nerve root, bone graft, plate.  Microscope   . CARDIAC CATHETERIZATION  09-06-2001  . FINGER AMPUTATION     of "little finger", traumatic   . VASECTOMY    . WISDOM TOOTH EXTRACTION      Current Medications: Current Meds  Medication Sig  . aspirin EC 81 MG tablet Take 2 tablets (162 mg total) by mouth daily.  Marland Kitchen Cod Liver Oil CAPS Take by mouth daily.    . ferrous fumarate (HEMOCYTE - 106 MG FE) 325 (106 FE) MG TABS Take 1 tablet by mouth.  . fexofenadine (ALLEGRA) 60 MG tablet Take 60 mg by mouth 2 (two) times daily.  . folic acid (FOLVITE) [id number] MCG tablet Take 800 mcg by mouth daily.   . ramipril (ALTACE) 2.5 MG capsule Take 1 capsule (2.5  mg total) by mouth daily. Please keep upcoming appt in September with Dr. Radford Pax for future refills. Thank you  . rosuvastatin (CRESTOR) 5 MG tablet Take 1 tablet (5 mg total) by mouth at bedtime.  . vitamin C (ASCORBIC ACID) 500 MG tablet Take 1,000 mg by mouth daily.   . vitamin E 400 UNIT capsule Take 400 Units by mouth daily.     Allergies:   Niacin and Niacin and related   Social History   Socioeconomic History  . Marital status: Married    Spouse name: Not on file  . Number of children: Not on file  . Years of education: Not on file  . Highest education level: Not on file  Occupational History  . 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 Financial resource strain: Not on file  . Food insecurity    Worry: Not on file    Inability: Not on file  . Transportation needs    Medical: Not on file    Non-medical: Not on file  Tobacco Use  . Smoking status: Never Smoker  . Smokeless tobacco: Never Used  Substance and Sexual Activity  . Alcohol use: Yes    Comment:  rarely  . Drug use: No  . Sexual activity: Yes  Lifestyle  . Physical activity    Days per week: Not on file    Minutes per session: Not on file  . Stress: Not on file  Relationships  . Social Herbalist on phone: Not on file    Gets together: Not on file    Attends religious service: Not on file    Active member of club or organization: Not on file    Attends meetings of clubs or organizations: Not on file    Relationship status: Not on file  Other Topics Concern  . Not on file  Social History Narrative  . Not on file     Family History: The patient's family history includes Breast cancer (age of onset: 50) in his mother; Heart attack in his father; Other (age of onset: 58) in his sister.  ROS:   Please see the history of present illness.    ROS  All other systems reviewed and negative.   EKGs/Labs/Other Studies Reviewed:    The  following studies were reviewed today: none  EKG:  EKG is  ordered today.  The ekg ordered today demonstrates NSR at 61bpm with LAVF, septal infarct and LVH by voltage  Recent Labs: No results found for requested labs within last 8760 hours.   Recent Lipid Panel    Component Value Date/Time   CHOL 120 09/18/2018 1009   TRIG 135 09/18/2018 1009   HDL 35 (L) 09/18/2018 1009   CHOLHDL 3.4 09/18/2018 1009   CHOLHDL 3.2 04/26/2016 0942   VLDL 23 04/26/2016 0942   LDLCALC 58 09/18/2018 1009    Physical Exam:    VS:  BP 118/78   Pulse 60   Ht 5\' 11"  (1.803 m)   Wt 197 lb (89.4 kg)   BMI 27.48 kg/m     Wt Readings from Last 3 Encounters:  09/26/19 197 lb (89.4 kg)  09/18/18 199 lb 12.8 oz (90.6 kg)  09/20/17 196 lb 6.4 oz (89.1 kg)     GEN:  Well nourished, well developed in no acute distress HEENT: Normal NECK: No JVD; No carotid bruits LYMPHATICS: No lymphadenopathy CARDIAC: RRR, no murmurs, rubs, gallops RESPIRATORY:  Clear to auscultation without rales, wheezing or rhonchi  ABDOMEN: Soft, non-tender, non-distended MUSCULOSKELETAL:  No edema; No deformity  SKIN: Warm and dry NEUROLOGIC:  Alert and oriented x 3 PSYCHIATRIC:  Normal affect   ASSESSMENT:    1. Coronary artery disease involving native coronary artery of native heart without angina pectoris   2. Pure hypercholesterolemia    PLAN:    In order of problems listed above:  1.  ASCAD - s/p AWMI 2002 s/p PCI of the LAD with last nuclear stress test 04/2016 with anterior scar but no ischemia. -he denies any anginal sx -continue ASA 81mg  daily and statin  2.  HLD -LDL goal < 70 -Repeat FLP and ALT -continue Crestor 5mg  daily  3.  HTN -BP controlled -continue Ramipril 2.5mg  daily -BMET in 11/2019   Medication Adjustments/Labs and Tests Ordered: Current medicines are reviewed at length with the patient today.  Concerns regarding medicines are outlined above.  Orders Placed This Encounter   Procedures  . EKG 12-Lead   No orders of the defined types were placed in this encounter.   Signed, Fransico Him, MD  09/26/2019 11:18 AM    [place]

## 2019-11-11 ENCOUNTER — Other Ambulatory Visit: Payer: Self-pay | Admitting: Cardiology

## 2019-11-29 ENCOUNTER — Other Ambulatory Visit: Payer: Self-pay

## 2019-11-29 ENCOUNTER — Other Ambulatory Visit: Payer: Medicare Other | Admitting: *Deleted

## 2019-11-29 DIAGNOSIS — E7800 Pure hypercholesterolemia, unspecified: Secondary | ICD-10-CM

## 2019-11-29 DIAGNOSIS — I10 Essential (primary) hypertension: Secondary | ICD-10-CM

## 2019-11-29 DIAGNOSIS — I2510 Atherosclerotic heart disease of native coronary artery without angina pectoris: Secondary | ICD-10-CM

## 2019-11-29 LAB — COMPREHENSIVE METABOLIC PANEL
ALT: 31 IU/L (ref 0–44)
AST: 21 IU/L (ref 0–40)
Albumin/Globulin Ratio: 1.6 (ref 1.2–2.2)
Albumin: 4.7 g/dL (ref 3.7–4.7)
Alkaline Phosphatase: 89 IU/L (ref 39–117)
BUN/Creatinine Ratio: 16 (ref 10–24)
BUN: 15 mg/dL (ref 8–27)
Bilirubin Total: 0.5 mg/dL (ref 0.0–1.2)
CO2: 23 mmol/L (ref 20–29)
Calcium: 9.4 mg/dL (ref 8.6–10.2)
Chloride: 103 mmol/L (ref 96–106)
Creatinine, Ser: 0.92 mg/dL (ref 0.76–1.27)
GFR calc Af Amer: 96 mL/min/{1.73_m2} (ref >59)
GFR calc non Af Amer: 83 mL/min/{1.73_m2} (ref >59)
Globulin, Total: 2.9 g/dL (ref 1.5–4.5)
Glucose: 92 mg/dL (ref 65–99)
Potassium: 4.2 mmol/L (ref 3.5–5.2)
Sodium: 140 mmol/L (ref 134–144)
Total Protein: 7.6 g/dL (ref 6.0–8.5)

## 2019-11-29 LAB — LIPID PANEL
Chol/HDL Ratio: 3.9 ratio (ref 0.0–5.0)
Cholesterol, Total: 128 mg/dL (ref 100–199)
HDL: 33 mg/dL — ABNORMAL LOW (ref >39)
LDL Chol Calc (NIH): 66 mg/dL (ref 0–99)
Triglycerides: 168 mg/dL — ABNORMAL HIGH (ref 0–149)
VLDL Cholesterol Cal: 29 mg/dL (ref 5–40)

## 2020-01-28 ENCOUNTER — Ambulatory Visit: Payer: Medicare Other

## 2020-02-21 ENCOUNTER — Ambulatory Visit: Payer: Medicare Other | Attending: Internal Medicine

## 2020-02-21 ENCOUNTER — Ambulatory Visit: Payer: Medicare Other

## 2020-02-21 DIAGNOSIS — Z23 Encounter for immunization: Secondary | ICD-10-CM

## 2020-02-21 NOTE — Progress Notes (Signed)
   Covid-19 Vaccination Clinic  Name:  Eugene Moreno    MRN: [id number] DOB: 1946-07-20  02/21/2020  [name] was observed post Covid-19 immunization for 15 minutes without incident. He was provided with Vaccine Information Sheet and instruction to access the V-Safe system.   [name] was instructed to call 911 with any severe reactions post vaccine: Marland Kitchen Difficulty breathing  . Swelling of face and throat  . A fast heartbeat  . A bad rash all over body  . Dizziness and weakness

## 2020-03-13 ENCOUNTER — Ambulatory Visit: Payer: Medicare Other | Attending: Internal Medicine

## 2020-03-13 DIAGNOSIS — Z23 Encounter for immunization: Secondary | ICD-10-CM

## 2020-05-12 ENCOUNTER — Other Ambulatory Visit: Payer: Self-pay

## 2020-05-12 MED ORDER — ROSUVASTATIN CALCIUM 5 MG PO TABS: 5.0000 mg | ORAL_TABLET | Freq: Every day | ORAL | 1 refills | Status: DC

## 2020-09-07 ENCOUNTER — Other Ambulatory Visit: Payer: Self-pay | Admitting: Cardiology

## 2020-09-11 ENCOUNTER — Other Ambulatory Visit: Payer: Self-pay | Admitting: Cardiology

## 2020-09-30 ENCOUNTER — Ambulatory Visit: Payer: Medicare Other | Admitting: Cardiology

## 2020-10-07 ENCOUNTER — Other Ambulatory Visit: Payer: Self-pay

## 2020-10-07 ENCOUNTER — Ambulatory Visit (INDEPENDENT_AMBULATORY_CARE_PROVIDER_SITE_OTHER): Payer: Medicare Other | Admitting: Cardiology

## 2020-10-07 ENCOUNTER — Encounter: Payer: Self-pay | Admitting: Cardiology

## 2020-10-07 VITALS — BP 122/76 | HR 70 | Ht 71.0 in | Wt 204.2 lb

## 2020-10-07 DIAGNOSIS — E7800 Pure hypercholesterolemia, unspecified: Secondary | ICD-10-CM

## 2020-10-07 DIAGNOSIS — I2510 Atherosclerotic heart disease of native coronary artery without angina pectoris: Secondary | ICD-10-CM | POA: Diagnosis not present

## 2020-10-07 DIAGNOSIS — I10 Essential (primary) hypertension: Secondary | ICD-10-CM | POA: Diagnosis not present

## 2020-10-07 NOTE — Patient Instructions (Signed)
Medication Instructions:  Your physician recommends that you continue on your current medications as directed. Please refer to the Current Medication list given to you today.  *If you need a refill on your cardiac medications before your next appointment, please call your pharmacy*  Lab Work: CMET and fasting lipids If you have labs (blood work) drawn today and your tests are completely normal, you will receive your results only by: Marland Kitchen MyChart Message (if you have MyChart) OR . A paper copy in the mail If you have any lab test that is abnormal or we need to change your treatment, we will call you to review the results.  Follow-Up: At [facility], you and your health needs are our priority.  As part of our continuing mission to provide you with exceptional heart care, we have created designated Provider Care Teams.  These Care Teams include your primary Cardiologist (physician) and Advanced Practice Providers (APPs -  Physician Assistants and Nurse Practitioners) who all work together to provide you with the care you need, when you need it.  Your next appointment:   1 year(s)  The format for your next appointment:   In Person  Provider:   You may see Fransico Him, MD or one of the following Advanced Practice Providers on your designated Care Team:    Melina Copa, PA-C  Ermalinda Barrios, PA-C

## 2020-10-07 NOTE — Addendum Note (Signed)
Addended by: Antonieta Iba on: 10/07/2020 10:45 AM   Modules accepted: Orders

## 2020-10-07 NOTE — Progress Notes (Signed)
Cardiology Office Note:    Date:  10/07/2020   ID:  Eugene Moreno, DOB 06/16/1946, MRN [id number]  PCP:  Leighton Ruff, MD  Cardiologist:  No primary care provider on file.    Referring MD: Leighton Ruff, MD   Chief Complaint  Patient presents with  . Coronary Artery Disease  . Hyperlipidemia    History of Present Illness:    Eugene Moreno is a 74 y.o. 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  He is here today for followup and is doing well.  He denies any chest pain or pressure, SOB, DOE, PND, orthopnea, LE edema, dizziness, palpitations or syncope. He is compliant with his meds and is tolerating meds with no SE.    Past Medical History:  Diagnosis Date  . Herniated disc    history of  . Hyperlipemia    tolerating crestor and niacin  . Leukopenia    history of, with positive ANA, stable  . 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 MI (myocardial infarction) ([place])    remote anterior, with last stress test 09/2010  . Neutropenia ([place]) 09/14/2012    Past Surgical History:  Procedure Laterality Date  . ANTERIOR CERVICAL DISCECTOMY  01-26-2002   C6-C7,  with a chronic C7 radiculopathy, decompression of the C7 nerve root, bone graft, plate.  Microscope   . CARDIAC CATHETERIZATION  09-06-2001  . FINGER AMPUTATION     of "little finger", traumatic   . VASECTOMY    . WISDOM TOOTH EXTRACTION      Current Medications: Current Meds  Medication Sig  . aspirin EC 81 MG tablet Take 2 tablets (162 mg total) by mouth daily.  Marland Kitchen Cod Liver Oil CAPS Take by mouth daily.    . ferrous fumarate (HEMOCYTE - 106 MG FE) 325 (106 FE) MG TABS Take 1 tablet by mouth.  . fexofenadine (ALLEGRA) 60 MG tablet Take 60 mg by mouth 2 (two) times daily.  . folic acid (FOLVITE) 637 MCG tablet Take 800 mcg by mouth daily.   . ramipril (ALTACE) 2.5 MG capsule TAKE 1 CAPSULE BY  MOUTH  DAILY  . rosuvastatin (CRESTOR) 5 MG tablet Take 1 tablet (5 mg total) by mouth at bedtime. Please schedule office visit before any future refills  . vitamin C (ASCORBIC ACID) 500 MG tablet Take 1,000 mg by mouth daily.   . vitamin E 400 UNIT capsule Take 400 Units by mouth daily.     Allergies:   Niacin and Niacin and related   Social History   Socioeconomic History  . Marital status: Married    Spouse name: Not on file  . Number of children: Not on file  . Years of education: Not on file  . Highest education level: Not on file  Occupational History  . 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 Smoking status: Never Smoker  . Smokeless tobacco: Never Used  Substance and Sexual Activity  . Alcohol use: Yes    Comment: rarely  . Drug use: No  . Sexual activity: Yes  Other Topics Concern  . Not on file  Social History Narrative  . Not on file   Social Determinants of Health   Financial Resource Strain:   . Difficulty of Paying Living Expenses: Not on file  Food Insecurity:   .  Worried About Charity fundraiser in the Last Year: Not on file  . Ran Out of Food in the Last Year: Not on file  Transportation Needs:   . Lack of Transportation (Medical): Not on file  . Lack of Transportation (Non-Medical): Not on file  Physical Activity:   . Days of Exercise per Week: Not on file  . Minutes of Exercise per Session: Not on file  Stress:   . Feeling of Stress : Not on file  Social Connections:   . Frequency of Communication with Friends and Family: Not on file  . Frequency of Social Gatherings with Friends and Family: Not on file  . Attends Religious Services: Not on file  . Active Member of Clubs or Organizations: Not on file  . Attends Archivist Meetings: Not on file  . Marital Status: Not on file     Family History: The patient's family history includes Breast cancer (age of onset: 64) in his  mother; Heart attack in his father; Other (age of onset: 86) in his sister.  ROS:   Please see the history of present illness.    ROS  All other systems reviewed and negative.   EKGs/Labs/Other Studies Reviewed:    The following studies were reviewed today: none  EKG:  EKG is  ordered today.  The ekg ordered today demonstrates NSR at 62bpm with LAFB, septal infarct and LVH by voltage Recent Labs: 11/29/2019: ALT 31; BUN 15; Creatinine, Ser 0.92; Potassium 4.2; Sodium 140   Recent Lipid Panel    Component Value Date/Time   CHOL 128 11/29/2019 0928   TRIG 168 (H) 11/29/2019 0928   HDL 33 (L) 11/29/2019 0928   CHOLHDL 3.9 11/29/2019 0928   CHOLHDL 3.2 04/26/2016 0942   VLDL 23 04/26/2016 0942   LDLCALC 66 11/29/2019 0928    Physical Exam:    VS:  BP 122/76   Pulse 70   Ht 5\' 11"  (1.803 m)   Wt 204 lb 3.2 oz (92.6 kg)   SpO2 96%   BMI 28.48 kg/m     Wt Readings from Last 3 Encounters:  10/07/20 204 lb 3.2 oz (92.6 kg)  09/26/19 197 lb (89.4 kg)  09/18/18 199 lb 12.8 oz (90.6 kg)     GEN: Well nourished, well developed in no acute distress HEENT: Normal NECK: No JVD; No carotid bruits LYMPHATICS: No lymphadenopathy CARDIAC:RRR, no murmurs, rubs, gallops RESPIRATORY:  Clear to auscultation without rales, wheezing or rhonchi  ABDOMEN: Soft, non-tender, non-distended MUSCULOSKELETAL:  No edema; No deformity  SKIN: Warm and dry NEUROLOGIC:  Alert and oriented x 3 PSYCHIATRIC:  Normal affect    ASSESSMENT:    1. Coronary artery disease involving native coronary artery of native heart without angina pectoris   2. Pure hypercholesterolemia   3. Essential hypertension    PLAN:    In order of problems listed above:  1.  ASCAD -s/p AWMI 2002 s/p PCI of the LAD with last nuclear stress test 04/2016 with anterior scar but no ischemia. -he has not had any anginal symptoms -continue ASA 81mg  daily and statin  2.  HLD -LDL goal < 70 -LDL was 66 11/2019 -repeat  FLP and ALT -continue Crestor 5mg  daily  3.  HTN -BP well controlled -continue Ramipril 2.5mg  daily -check CMET   Medication Adjustments/Labs and Tests Ordered: Current medicines are reviewed at length with the patient today.  Concerns regarding medicines are outlined above.  Orders Placed This Encounter  Procedures  .  EKG 12-Lead   No orders of the defined types were placed in this encounter.   Signed, Fransico Him, MD  10/07/2020 10:39 AM    [place]

## 2020-10-08 ENCOUNTER — Other Ambulatory Visit: Payer: Medicare Other | Admitting: *Deleted

## 2020-10-08 DIAGNOSIS — E7800 Pure hypercholesterolemia, unspecified: Secondary | ICD-10-CM

## 2020-10-08 LAB — COMPREHENSIVE METABOLIC PANEL
ALT: 29 IU/L (ref 0–44)
AST: 23 IU/L (ref 0–40)
Albumin/Globulin Ratio: 1.6 (ref 1.2–2.2)
Albumin: 4.6 g/dL (ref 3.7–4.7)
Alkaline Phosphatase: 77 IU/L (ref 44–121)
BUN/Creatinine Ratio: 15 (ref 10–24)
BUN: 16 mg/dL (ref 8–27)
Bilirubin Total: 0.5 mg/dL (ref 0.0–1.2)
CO2: 24 mmol/L (ref 20–29)
Calcium: 9.2 mg/dL (ref 8.6–10.2)
Chloride: 102 mmol/L (ref 96–106)
Creatinine, Ser: 1.06 mg/dL (ref 0.76–1.27)
GFR calc Af Amer: 80 mL/min/{1.73_m2} (ref >59)
GFR calc non Af Amer: 69 mL/min/{1.73_m2} (ref >59)
Globulin, Total: 2.9 g/dL (ref 1.5–4.5)
Glucose: 91 mg/dL (ref 65–99)
Potassium: 4.8 mmol/L (ref 3.5–5.2)
Sodium: 138 mmol/L (ref 134–144)
Total Protein: 7.5 g/dL (ref 6.0–8.5)

## 2020-10-08 LAB — LIPID PANEL
Chol/HDL Ratio: 3.4 ratio (ref 0.0–5.0)
Cholesterol, Total: 130 mg/dL (ref 100–199)
HDL: 38 mg/dL — ABNORMAL LOW (ref >39)
LDL Chol Calc (NIH): 73 mg/dL (ref 0–99)
Triglycerides: 103 mg/dL (ref 0–149)
VLDL Cholesterol Cal: 19 mg/dL (ref 5–40)

## 2020-12-02 ENCOUNTER — Other Ambulatory Visit: Payer: Self-pay | Admitting: Cardiology

## 2021-02-27 ENCOUNTER — Other Ambulatory Visit: Payer: Self-pay

## 2021-02-27 ENCOUNTER — Ambulatory Visit (HOSPITAL_COMMUNITY)
Admission: RE | Admit: 2021-02-27 | Discharge: 2021-02-27 | Disposition: A | Discharge status: Home / Self Care | Payer: Medicare Other | Source: Ambulatory Visit | Attending: Family Medicine | Admitting: Family Medicine

## 2021-02-27 ENCOUNTER — Other Ambulatory Visit (HOSPITAL_COMMUNITY): Payer: Self-pay | Admitting: Family Medicine

## 2021-02-27 DIAGNOSIS — R6889 Other general symptoms and signs: Secondary | ICD-10-CM

## 2021-03-17 ENCOUNTER — Other Ambulatory Visit: Payer: Self-pay | Admitting: Urology

## 2021-03-17 DIAGNOSIS — R972 Elevated prostate specific antigen [PSA]: Secondary | ICD-10-CM

## 2021-04-11 ENCOUNTER — Other Ambulatory Visit: Payer: Self-pay

## 2021-04-11 ENCOUNTER — Ambulatory Visit
Admission: RE | Admit: 2021-04-11 | Discharge: 2021-04-11 | Disposition: A | Discharge status: Home / Self Care | Payer: Medicare Other | Source: Ambulatory Visit | Attending: Urology | Admitting: Urology

## 2021-04-11 DIAGNOSIS — R972 Elevated prostate specific antigen [PSA]: Secondary | ICD-10-CM

## 2021-04-11 MED ORDER — GADOBENATE DIMEGLUMINE 529 MG/ML IV SOLN
19.0000 mL | Freq: Once | INTRAVENOUS | Status: AC | PRN
  Administered 2021-04-11: 19 mL via INTRAVENOUS

## 2021-06-09 NOTE — Progress Notes (Signed)
GU Location of Tumor / Histology: prostate  If Prostate Cancer, Gleason Score is (4 + 3) and PSA is (8.04)  Eugene Moreno presented with signs/symptoms of: elevated PSA  Biopsies revealed:  04/21/2021   Past/Anticipated interventions by urology, if any:  04/21/2021 MRI Fusion biopsy  Past/Anticipated interventions by medical oncology, if any: none at this time  Weight changes, if any: no  IPSS Score: 3 SHIM Score: 5  Bowel/Bladder complaints, if any: yes, weak urine stream and rare nocturia. Denies frequency, urgency, incomplete bladder emptying, hematuria   Nausea/Vomiting, if any: no  Pain issues, if any:  no  SAFETY ISSUES: Prior radiation? no Pacemaker/ICD? no Possible current pregnancy? no Is the patient on methotrexate? no  Current Complaints / other details:  none   Vitals:   06/12/21 1344  BP: 107/83  Pulse: 71  Resp: 18  Temp: (!) 96.9 F (36.1 C)  TempSrc: Temporal  SpO2: 99%  Weight: 204 lb 2 oz (92.6 kg)  Height: 5\' 11"  (1.803 m)

## 2021-06-12 ENCOUNTER — Ambulatory Visit
Admission: RE | Admit: 2021-06-12 | Discharge: 2021-06-12 | Disposition: A | Discharge status: Home / Self Care | Payer: Medicare Other | Source: Ambulatory Visit | Attending: Radiation Oncology | Admitting: Radiation Oncology

## 2021-06-12 ENCOUNTER — Other Ambulatory Visit: Payer: Self-pay

## 2021-06-12 ENCOUNTER — Encounter: Payer: Self-pay | Admitting: Radiation Oncology

## 2021-06-12 VITALS — BP 107/83 | HR 71 | Temp 96.9°F | Resp 18 | Ht 71.0 in | Wt 204.1 lb

## 2021-06-12 DIAGNOSIS — C61 Malignant neoplasm of prostate: Secondary | ICD-10-CM | POA: Insufficient documentation

## 2021-06-12 DIAGNOSIS — Z7982 Long term (current) use of aspirin: Secondary | ICD-10-CM | POA: Insufficient documentation

## 2021-06-12 DIAGNOSIS — Z803 Family history of malignant neoplasm of breast: Secondary | ICD-10-CM | POA: Diagnosis not present

## 2021-06-12 DIAGNOSIS — D472 Monoclonal gammopathy: Secondary | ICD-10-CM | POA: Insufficient documentation

## 2021-06-12 DIAGNOSIS — E785 Hyperlipidemia, unspecified: Secondary | ICD-10-CM | POA: Insufficient documentation

## 2021-06-12 DIAGNOSIS — I252 Old myocardial infarction: Secondary | ICD-10-CM | POA: Diagnosis not present

## 2021-06-12 NOTE — Progress Notes (Signed)
Radiation Oncology         [phone] ________________________________  Initial Outpatient Consultation  Name: Eugene Moreno MRN: [id number]  Date: 06/12/2021  DOB: 1946-01-01  CC:Cari Caraway, MD  Ardis Hughs, MD   REFERRING PHYSICIAN: Ardis Hughs, MD  DIAGNOSIS: 75 y.o. gentleman with Stage T1c adenocarcinoma of the prostate with Gleason score of 4+3, and PSA of 8.04.    ICD-10-CM   1. Prostate cancer ([place])  C61     2. Malignant neoplasm of prostate ([place])  C61       HISTORY OF PRESENT ILLNESS: Eugene Moreno is a 75 y.o. male with a diagnosis of prostate cancer. He has been followed by Dr. Louis Meckel for an elevated PSA since the fall  of 2018 when his PSA was 4.44. Subsequent PSA levels fluctuated but remained overall stable between 3-4 with no nodules or concerning findings on digital rectal exams (DRE).   In 10/2020, his PSA increased to 7.58, up from 3.75 in 12/2018 but on exam, he was noted to have some tenderness, more consistent with prostatitis. However, a repeat PSA in 02/2021 showed persistent elevation at 8.04. This prompted a prostate MRI which was performed on 04/11/21 and showed a 1.7 cm PI-RADS 4 lesion of the right anterior and posterolateral peripheral zone in the mid gland and apex. There was no evidence of extracapsular disease, lymphadenopathy or osseous metastatic disease.  The prostate volume was estimated to be 18.67 cc.  The patient proceeded to MRI fusion biopsy of the prostate on 04/21/21.  The prostate volume measured 19.11 cc by ultrasound.  Out of 15 core biopsies, 7 were positive.  The maximum Gleason score was 4+3, and this was seen in 2 of the 3 samples taken from the ROI MRI lesion, as well as in the right mid lateral, and right apex lateral. Additionally, Gleason 3+4 was seen in the right mid and left mid lateral, and Gleason 3+3 in  the right apex.  The patient reviewed the biopsy results with his urologist and he has kindly been  referred today for discussion of potential radiation treatment options. He and his wife do a lot of traveling for pleasure and also visiting grandkids in [place], [place]. and for this reason, he is leaning towards brachytherapy coming into the conversation.   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  Microscope    CARDIAC CATHETERIZATION  09-06-2001   FINGER AMPUTATION     of "little finger", traumatic    VASECTOMY     WISDOM TOOTH EXTRACTION      FAMILY HISTORY:  Family History  Problem Relation Age of Onset   Breast cancer Mother 43   Heart attack Father        x3 between ages 37-64   Other Sister 92       angioplasty    SOCIAL HISTORY:  Social History   Socioeconomic History   Marital status: Married    Spouse name: Not on file   Number of children: Not on file   Years of education: Not on file   Highest education level: Not on file  Occupational History   Occupation: retired    Fish farm manager: [employer]    Comment: Gaffer  Tobacco Use   Smoking status: Never   Smokeless tobacco: Never  Substance and Sexual Activity   Alcohol use: Yes    Comment: rarely   Drug use: No   Sexual activity: Yes  Other Topics Concern   Not on file  Social History Narrative   Not on file   Social Determinants of Health   Financial Resource Strain: Not on file  Food Insecurity: Not on  file  Transportation Needs: Not on file  Physical Activity: Not on file  Stress: Not on file  Social Connections: Not on file  Intimate Partner Violence: Not on file    ALLERGIES: Niacin and Niacin and related  MEDICATIONS:  Current Outpatient Medications  Medication Sig Dispense Refill   aspirin EC 81 MG tablet Take 2 tablets (162 mg total) by mouth daily.     Cetirizine HCl (ZYRTEC ALLERGY) 10 MG CAPS 1 tablet     Cod Liver Oil CAPS Take by mouth daily.       ferrous fumarate (HEMOCYTE - 106 MG FE) 325 (106 FE) MG TABS Take 1 tablet by mouth.     folic acid (FOLVITE) 967 MCG tablet Take 800 mcg by mouth daily.      ramipril (ALTACE) 2.5 MG capsule TAKE 1 CAPSULE BY MOUTH  DAILY 90 capsule 3   rosuvastatin (CRESTOR) 5 MG tablet TAKE 1 TABLET BY MOUTH AT  BEDTIME. PLEASE SCHEDULE  OFFICE VISIT BEFORE ANY  FUTURE REFILLS 90 tablet 3   vitamin C (ASCORBIC ACID) 500 MG tablet Take 1,000 mg by mouth daily.      vitamin E 400 UNIT capsule Take 400 Units by mouth daily.     No current facility-administered medications for this encounter.    REVIEW OF SYSTEMS:  On review of systems, the patient reports that he is doing well overall. He denies any chest pain, shortness of breath, cough, fevers, chills, night sweats, unintended weight changes. He denies any bowel disturbances, and denies abdominal pain, nausea or vomiting. He denies any new musculoskeletal or joint aches or pains. His IPSS was 3, indicating mild urinary symptoms. He reports weak urine stream and rare nocturia. His SHIM was 5, indicating he has severe erectile dysfunction. A complete review of systems is obtained and is otherwise negative.    PHYSICAL EXAM:  Wt Readings from Last 3 Encounters:  06/12/21 204 lb 2 oz (92.6 kg)  10/07/20 204 lb 3.2 oz (92.6 kg)  09/26/19 197 lb (89.4 kg)   Temp Readings from Last 3 Encounters:  06/12/21 (!) 96.9 F (36.1 C) (Temporal)  11/05/16 97.9 F (36.6 C) (Oral)  11/03/15 97.8 F  (36.6 C) (Oral)   BP Readings from Last 3 Encounters:  06/12/21 107/83  10/07/20 122/76  09/26/19 118/78   Pulse Readings from Last 3 Encounters:  06/12/21 71  10/07/20 70  09/26/19 60   Pain Assessment Pain Score: 0-No pain/10  In general this is a well appearing Caucasian male in no acute distress. He's alert and oriented x4 and appropriate throughout the examination. Cardiopulmonary assessment is negative for acute distress, and he exhibits normal effort.  KPS = 100  100 - Normal; no complaints; no evidence of disease. 90   - Able to carry on normal activity; minor signs or symptoms of disease. 80   - Normal activity with effort; some signs or symptoms of disease. 79   - Cares for self; unable to carry on normal activity or to do active work. 60   - Requires occasional assistance, but is able to care for most of his personal needs. 50   - Requires considerable assistance and frequent medical care. 58   - Disabled; requires special care and assistance. 7   - Severely disabled; hospital admission is indicated although death not imminent. 30   - Very sick; hospital admission necessary; active supportive treatment necessary. 10   - Moribund; fatal processes progressing rapidly. 0     - Dead  Karnofsky DA, Abelmann [place], Craver LS and Burchenal [facility] [phone]) The use of the nitrogen mustards in the palliative treatment of carcinoma: with particular reference to bronchogenic carcinoma Cancer 1 634-56  LABORATORY DATA:  Lab Results  Component Value Date   WBC 3.9 (L) 11/05/2016   HGB 14.9 11/05/2016   HCT 43.4 11/05/2016   MCV 91.8 11/05/2016   PLT 149 11/05/2016   Lab Results  Component Value Date   NA 138 10/08/2020   K 4.8 10/08/2020   CL 102 10/08/2020   CO2 24 10/08/2020   Lab Results  Component Value Date   ALT 29 10/08/2020   AST 23 10/08/2020   ALKPHOS 77 10/08/2020   BILITOT 0.5 10/08/2020     RADIOGRAPHY: No results found.    IMPRESSION/PLAN: 1. 75  y.o. gentleman with Stage T1c adenocarcinoma of the prostate with Gleason Score of 4+3, and PSA of 8.04. We discussed the patient's workup and outlined the nature of prostate cancer in this setting. The patient's T stage, Gleason's score, and PSA put him into the unfavorable intermediate risk group. Accordingly, he is eligible for a variety of potential treatment options including brachytherapy, 5.5 weeks of external radiation, or prostatectomy. We discussed the available radiation techniques, and focused on the details and logistics of delivery. We discussed and outlined the risks, benefits, short and long-term effects associated with radiotherapy and compared and contrasted these with prostatectomy. We discussed the role of SpaceOAR gel in reducing the rectal toxicity associated with radiotherapy. We also detailed the role of ADT in the treatment of unfavorable intermediate risk prostate cancer and outlined the associated side effects that could be expected with this therapy. We discussed that while there is a modest improvement in the progression free survival, there is no improvement in overall survival when ADT is used concurrently with radiation in the treatment of intermediate risk prostate cancer and therefore, the negative impact on quality of life from the side effects likely outweighs the small benefit. After a detailed discussion, he is in favor of avoiding ADT at this time and reserving it for future use in the unlikely event that the PSA continues to rise despite treatment. He appears to have a good understanding of his disease and our treatment recommendations which are of curative intent.  He was encouraged to ask questions that were answered to his stated satisfaction.  At the conclusion of our conversation, the patient is interested in moving forward with brachytherapy and use of SpaceOAR gel to reduce rectal toxicity from radiotherapy.  We will share our discussion with Dr. Louis Meckel and move  forward with scheduling his CT [facility] planning appointment in the near  future.  The patient met briefly with Romie Jumper in our office who will be working closely with him to coordinate OR scheduling and pre and post procedure appointments.  We will contact the pharmaceutical rep to ensure that [place] is available at the time of procedure.  We enjoyed meeting him today and look forward to continuing to participate in his care.  We personally spent 75 minutes in this encounter including chart review, reviewing radiological studies, meeting face-to-face with the patient, entering orders and completing documentation.    Nicholos Johns, PA-C    Tyler Pita, MD  [facility] Direct Dial: [phone]  Fax: [phone] [email or web address]  Skype  LinkedIn   This document serves as a record of services personally performed by Tyler Pita, MD and Freeman Caldron, PA-C. It was created on their behalf by Wilburn Mylar, a trained medical scribe. The creation of this record is based on the scribe's personal observations and the provider's statements to them. This document has been checked and approved by the attending provider.

## 2021-06-12 NOTE — Progress Notes (Signed)
See MD note for nursing evaluation. °

## 2021-06-15 ENCOUNTER — Other Ambulatory Visit: Payer: Self-pay | Admitting: Urology

## 2021-06-15 ENCOUNTER — Telehealth: Payer: Self-pay | Admitting: *Deleted

## 2021-06-15 DIAGNOSIS — C61 Malignant neoplasm of prostate: Secondary | ICD-10-CM

## 2021-06-15 NOTE — Telephone Encounter (Signed)
CALLED PATIENT TO INFORM OF PRE-SEED APPTS.FOR 08-06-21 AND HIS IMPLANT FOR 09-03-21, SPOKE WITH PATIENT AND HE IS AWARE OF THESE APPTS.

## 2021-08-05 ENCOUNTER — Telehealth: Payer: Self-pay | Admitting: *Deleted

## 2021-08-05 NOTE — Telephone Encounter (Signed)
CALLED PATIENT TO REMIND OF PRE-SEED APPTS. FOR 08-06-21, SPOKE WITH PATIENT AND HE IS AWARE OF THESE APPTS.

## 2021-08-06 ENCOUNTER — Ambulatory Visit
Admission: RE | Admit: 2021-08-06 | Discharge: 2021-08-06 | Disposition: A | Discharge status: Home / Self Care | Payer: Medicare Other | Source: Ambulatory Visit | Attending: Radiation Oncology | Admitting: Radiation Oncology

## 2021-08-06 ENCOUNTER — Ambulatory Visit
Admission: RE | Admit: 2021-08-06 | Discharge: 2021-08-06 | Disposition: A | Discharge status: Home / Self Care | Payer: Medicare Other | Source: Ambulatory Visit | Attending: Urology | Admitting: Urology

## 2021-08-06 ENCOUNTER — Encounter (HOSPITAL_COMMUNITY)
Admission: RE | Admit: 2021-08-06 | Discharge: 2021-08-06 | Disposition: A | Discharge status: Home / Self Care | Payer: Medicare Other | Source: Ambulatory Visit | Attending: Urology | Admitting: Urology

## 2021-08-06 ENCOUNTER — Other Ambulatory Visit: Payer: Self-pay

## 2021-08-06 ENCOUNTER — Ambulatory Visit (HOSPITAL_COMMUNITY)
Admission: RE | Admit: 2021-08-06 | Discharge: 2021-08-06 | Disposition: A | Discharge status: Home / Self Care | Payer: Medicare Other | Source: Ambulatory Visit | Attending: Urology | Admitting: Urology

## 2021-08-06 DIAGNOSIS — C61 Malignant neoplasm of prostate: Secondary | ICD-10-CM

## 2021-08-06 NOTE — Progress Notes (Signed)
  Radiation Oncology         [phone]) [phone] ________________________________  Name: SILIS GAETZ MRN: [id number]  Date: 08/06/2021  DOB: 01/21/1946  SIMULATION AND TREATMENT PLANNING NOTE PUBIC ARCH STUDY  [id number], Abigail Butts, MD  Ardis Hughs, MD  DIAGNOSIS:  75 y.o. gentleman with Stage T1c adenocarcinoma of the prostate with Gleason score of 4+3, and PSA of 8.04.  Oncology History  Malignant neoplasm of prostate ([place])  04/21/2021 Cancer Staging   Staging form: Prostate, AJCC 8th Edition - Clinical stage from 04/21/2021: Stage IIC (cT1c, cN0, cM0, PSA: 8, Grade Group: 3) - Signed by Freeman Caldron, PA-C on 06/12/2021 Histopathologic type: Adenocarcinoma, NOS Stage prefix: Initial diagnosis Prostate specific antigen (PSA) range: Less than 10 Gleason primary pattern: 4 Gleason secondary pattern: 3 Gleason score: 7 Histologic grading system: 5 grade system Number of biopsy cores examined: 15 Number of biopsy cores positive: 7 Location of positive needle core biopsies: Both sides   06/12/2021 Initial Diagnosis   Malignant neoplasm of prostate ([place])       ICD-10-CM   1. Malignant neoplasm of prostate ([place])  C61       COMPLEX SIMULATION:  The patient presented today for evaluation for possible prostate seed implant. He was brought to the radiation planning suite and placed supine on the CT couch. A 3-dimensional image study set was obtained in upload to the planning computer. There, on each axial slice, I contoured the prostate gland. Then, using three-dimensional radiation planning tools I reconstructed the prostate in view of the structures from the transperineal needle pathway to assess for possible pubic arch interference. In doing so, I did not appreciate any pubic arch interference. Also, the patient's prostate volume was estimated based on the drawn structure. The volume was 19 cc.  Given the pubic arch appearance and prostate volume, patient remains a good candidate to  proceed with prostate seed implant. Today, he freely provided informed written consent to proceed.    PLAN: The patient will undergo prostate seed implant.   ________________________________  Sheral Apley. Tammi Klippel, M.D.

## 2021-08-17 ENCOUNTER — Other Ambulatory Visit: Payer: Self-pay | Admitting: Cardiology

## 2021-08-27 ENCOUNTER — Telehealth: Payer: Self-pay | Admitting: *Deleted

## 2021-08-27 NOTE — Telephone Encounter (Signed)
CALLED PATIENT TO REMIND OF LAB APPT. FOR 08-31-21, SPOKE WITH PATIENT AND HE IS AWARE OF THIS APPT.

## 2021-08-31 ENCOUNTER — Other Ambulatory Visit: Payer: Self-pay

## 2021-08-31 ENCOUNTER — Encounter (HOSPITAL_BASED_OUTPATIENT_CLINIC_OR_DEPARTMENT_OTHER): Payer: Self-pay | Admitting: Urology

## 2021-08-31 ENCOUNTER — Encounter (HOSPITAL_COMMUNITY)
Admission: RE | Admit: 2021-08-31 | Discharge: 2021-08-31 | Disposition: A | Discharge status: Home / Self Care | Payer: Medicare Other | Source: Ambulatory Visit | Attending: Urology | Admitting: Urology

## 2021-08-31 DIAGNOSIS — Z01818 Encounter for other preprocedural examination: Secondary | ICD-10-CM | POA: Insufficient documentation

## 2021-08-31 LAB — CBC
HCT: 44.6 % (ref 39.0–52.0)
Hemoglobin: 15.6 g/dL (ref 13.0–17.0)
MCH: 32.8 pg (ref 26.0–34.0)
MCHC: 35 g/dL (ref 30.0–36.0)
MCV: 93.7 fL (ref 80.0–100.0)
Platelets: 175 10*3/uL (ref 150–400)
RBC: 4.76 MIL/uL (ref 4.22–5.81)
RDW: 12.4 % (ref 11.5–15.5)
WBC: 3.7 10*3/uL — ABNORMAL LOW (ref 4.0–10.5)
nRBC: 0 % (ref 0.0–0.2)

## 2021-08-31 LAB — COMPREHENSIVE METABOLIC PANEL
ALT: 37 U/L (ref 0–44)
AST: 29 U/L (ref 15–41)
Albumin: 4.5 g/dL (ref 3.5–5.0)
Alkaline Phosphatase: 63 U/L (ref 38–126)
Anion gap: 8 (ref 5–15)
BUN: 16 mg/dL (ref 8–23)
CO2: 25 mmol/L (ref 22–32)
Calcium: 9.3 mg/dL (ref 8.9–10.3)
Chloride: 106 mmol/L (ref 98–111)
Creatinine, Ser: 0.83 mg/dL (ref 0.61–1.24)
GFR, Estimated: >60 mL/min (ref >60)
Glucose, Bld: 100 mg/dL — ABNORMAL HIGH (ref 70–99)
Potassium: 4.2 mmol/L (ref 3.5–5.1)
Sodium: 139 mmol/L (ref 135–145)
Total Bilirubin: 0.9 mg/dL (ref 0.3–1.2)
Total Protein: 7.9 g/dL (ref 6.5–8.1)

## 2021-08-31 LAB — PROTIME-INR
INR: 1 (ref 0.8–1.2)
Prothrombin Time: 13.4 seconds (ref 11.4–15.2)

## 2021-08-31 LAB — APTT: aPTT: 25 seconds (ref 24–36)

## 2021-08-31 NOTE — Progress Notes (Signed)
Spoke w/ via phone for pre-op interview--- pt Lab needs dos---- no              Lab results------ pt had lab work done 08-31-2021, CBC/ CMP/ PT/PTT, results in epic;  current ekg / cxr in epic/ chart COVID test -----patient states asymptomatic no test needed Arrive at -------  1015 on 09-03-2021 NPO after MN NO Solid Food.  Clear liquids from MN until--- 0915 Med rec completed Medications to take morning of surgery ----- crestor, zyrtec Diabetic medication ----- n/a Patient instructed no nail polish to be worn day of surgery Patient instructed to bring photo id and insurance card day of surgery Patient aware to have Driver (ride ) / caregiver  for 24 hours after surgery --wife, Pamala Hurry Patient Special Instructions ----- will do one fleet enema morning of surgery Pre-Op special Istructions ----- n/a Patient verbalized understanding of instructions that were given at this phone interview. Patient denies shortness of breath, chest pain, fever, cough at this phone interview.   Anesthesia Review: HTN;  CAD hx MI 09/ 2002 s/p PCI with DES x1 to proxLAD;  MGUS Pt denies cardiac s&s, sob, and no peripheral swelling.  PCP: Dr Amedeo Gory Cardiologist : Dr T. Turner (lov 10-07-2020 epic) Chest x-ray : 08-06-2021 epic EKG : 10-07-2020 epic Echo : 12-10-2008 epic Stress test: 05-10-2016 epic Cardiac Cath : 09-06-2001 epic Activity level: denies sob w/ any activities Sleep Study/ CPAP :  no Blood Thinner/ Instructions /Last Dose:  no ASA / Instructions/ Last Dose :  ASA '81mg'$  x2 daily;  per pt was given instructions from dr Louis Meckel office to start taking only one 81 mg asa starting 08-30-2021 until surgery.

## 2021-09-02 ENCOUNTER — Telehealth: Payer: Self-pay | Admitting: *Deleted

## 2021-09-02 NOTE — Telephone Encounter (Signed)
Called patient to remind of procedure for 09-03-21, spoke with patient and he is aware of this procedure

## 2021-09-03 ENCOUNTER — Ambulatory Visit (HOSPITAL_BASED_OUTPATIENT_CLINIC_OR_DEPARTMENT_OTHER): Payer: Medicare Other | Admitting: Certified Registered Nurse Anesthetist

## 2021-09-03 ENCOUNTER — Encounter (HOSPITAL_BASED_OUTPATIENT_CLINIC_OR_DEPARTMENT_OTHER)
Admission: RE | Disposition: A | Discharge status: Home / Self Care | Payer: Self-pay | Source: Home / Self Care | Attending: Urology

## 2021-09-03 ENCOUNTER — Encounter (HOSPITAL_BASED_OUTPATIENT_CLINIC_OR_DEPARTMENT_OTHER): Payer: Self-pay | Admitting: Urology

## 2021-09-03 ENCOUNTER — Ambulatory Visit (HOSPITAL_COMMUNITY): Payer: Medicare Other

## 2021-09-03 ENCOUNTER — Ambulatory Visit (HOSPITAL_BASED_OUTPATIENT_CLINIC_OR_DEPARTMENT_OTHER)
Admission: RE | Admit: 2021-09-03 | Discharge: 2021-09-03 | Disposition: A | Discharge status: Home / Self Care | Payer: Medicare Other | Attending: Urology | Admitting: Urology

## 2021-09-03 DIAGNOSIS — C61 Malignant neoplasm of prostate: Secondary | ICD-10-CM | POA: Diagnosis not present

## 2021-09-03 DIAGNOSIS — I252 Old myocardial infarction: Secondary | ICD-10-CM | POA: Diagnosis not present

## 2021-09-03 DIAGNOSIS — Z8249 Family history of ischemic heart disease and other diseases of the circulatory system: Secondary | ICD-10-CM | POA: Diagnosis not present

## 2021-09-03 DIAGNOSIS — Z803 Family history of malignant neoplasm of breast: Secondary | ICD-10-CM | POA: Insufficient documentation

## 2021-09-03 DIAGNOSIS — Z888 Allergy status to other drugs, medicaments and biological substances status: Secondary | ICD-10-CM | POA: Diagnosis not present

## 2021-09-03 DIAGNOSIS — Z87891 Personal history of nicotine dependence: Secondary | ICD-10-CM | POA: Insufficient documentation

## 2021-09-03 HISTORY — DX: Malignant neoplasm of prostate: C61

## 2021-09-03 HISTORY — DX: Atherosclerotic heart disease of native coronary artery without angina pectoris: I25.10

## 2021-09-03 HISTORY — DX: Presence of spectacles and contact lenses: Z97.3

## 2021-09-03 HISTORY — DX: Presence of external hearing-aid: Z97.4

## 2021-09-03 HISTORY — DX: Essential (primary) hypertension: I10

## 2021-09-03 HISTORY — PX: RADIOACTIVE SEED IMPLANT: SHX5150

## 2021-09-03 HISTORY — PX: SPACE OAR INSTILLATION: SHX6769

## 2021-09-03 SURGERY — INSERTION, RADIATION SOURCE, PROSTATE
Anesthesia: General | Site: Prostate

## 2021-09-03 MED ORDER — DEXAMETHASONE SODIUM PHOSPHATE 4 MG/ML IJ SOLN
INTRAMUSCULAR | Status: DC | PRN
  Administered 2021-09-03: 10 mg via INTRAVENOUS

## 2021-09-03 MED ORDER — CIPROFLOXACIN IN D5W 400 MG/200ML IV SOLN
400.0000 mg | INTRAVENOUS | Status: AC
  Administered 2021-09-03: 400 mg via INTRAVENOUS

## 2021-09-03 MED ORDER — OXYCODONE HCL 5 MG/5ML PO SOLN: 5.0000 mg | Freq: Once | ORAL | Status: DC | PRN

## 2021-09-03 MED ORDER — OXYCODONE HCL 5 MG PO TABS: 5.0000 mg | ORAL_TABLET | Freq: Once | ORAL | Status: DC | PRN

## 2021-09-03 MED ORDER — LACTATED RINGERS IV SOLN: INTRAVENOUS | Status: DC

## 2021-09-03 MED ORDER — TRAMADOL HCL 50 MG PO TABS: 50.0000 mg | ORAL_TABLET | Freq: Four times a day (QID) | ORAL | 0 refills | Status: DC | PRN

## 2021-09-03 MED ORDER — LIDOCAINE HCL (CARDIAC) PF 100 MG/5ML IV SOSY
PREFILLED_SYRINGE | INTRAVENOUS | Status: DC | PRN
  Administered 2021-09-03: 80 mg via INTRAVENOUS

## 2021-09-03 MED ORDER — LIDOCAINE HCL (PF) 2 % IJ SOLN
INTRAMUSCULAR | Status: AC
  Filled 2021-09-03: qty 5

## 2021-09-03 MED ORDER — CIPROFLOXACIN IN D5W 400 MG/200ML IV SOLN
INTRAVENOUS | Status: AC
  Filled 2021-09-03: qty 200

## 2021-09-03 MED ORDER — ONDANSETRON HCL 4 MG/2ML IJ SOLN
INTRAMUSCULAR | Status: AC
  Filled 2021-09-03: qty 2

## 2021-09-03 MED ORDER — TAMSULOSIN HCL 0.4 MG PO CAPS: 0.4000 mg | ORAL_CAPSULE | Freq: Every day | ORAL | 5 refills | Status: DC

## 2021-09-03 MED ORDER — STERILE WATER FOR IRRIGATION IR SOLN
Status: DC | PRN
  Administered 2021-09-03: 500 mL

## 2021-09-03 MED ORDER — ONDANSETRON HCL 4 MG/2ML IJ SOLN
INTRAMUSCULAR | Status: DC | PRN
  Administered 2021-09-03: 4 mg via INTRAVENOUS

## 2021-09-03 MED ORDER — SODIUM CHLORIDE 0.9 % IV SOLN
INTRAVENOUS | Status: AC | PRN
  Administered 2021-09-03: 1000 mL

## 2021-09-03 MED ORDER — MIDAZOLAM HCL 2 MG/2ML IJ SOLN
INTRAMUSCULAR | Status: AC
  Filled 2021-09-03: qty 2

## 2021-09-03 MED ORDER — PROPOFOL 10 MG/ML IV BOLUS
INTRAVENOUS | Status: DC | PRN
  Administered 2021-09-03: 200 mg via INTRAVENOUS

## 2021-09-03 MED ORDER — EPHEDRINE SULFATE 50 MG/ML IJ SOLN
INTRAMUSCULAR | Status: DC | PRN
  Administered 2021-09-03 (×4): 10 mg via INTRAVENOUS

## 2021-09-03 MED ORDER — EPHEDRINE 5 MG/ML INJ
INTRAVENOUS | Status: AC
  Filled 2021-09-03: qty 5

## 2021-09-03 MED ORDER — FENTANYL CITRATE (PF) 100 MCG/2ML IJ SOLN
INTRAMUSCULAR | Status: DC | PRN
  Administered 2021-09-03: 25 ug via INTRAVENOUS
  Administered 2021-09-03: 50 ug via INTRAVENOUS
  Administered 2021-09-03: 25 ug via INTRAVENOUS

## 2021-09-03 MED ORDER — MIDAZOLAM HCL 5 MG/5ML IJ SOLN
INTRAMUSCULAR | Status: DC | PRN
  Administered 2021-09-03 (×2): 1 mg via INTRAVENOUS

## 2021-09-03 MED ORDER — PROPOFOL 10 MG/ML IV BOLUS
INTRAVENOUS | Status: AC
  Filled 2021-09-03: qty 20

## 2021-09-03 MED ORDER — FLEET ENEMA 7-19 GM/118ML RE ENEM: 1.0000 | ENEMA | Freq: Once | RECTAL | Status: DC

## 2021-09-03 MED ORDER — IOHEXOL 300 MG/ML  SOLN
INTRAMUSCULAR | Status: DC | PRN
  Administered 2021-09-03: 7 mL

## 2021-09-03 MED ORDER — SODIUM CHLORIDE (PF) 0.9 % IJ SOLN
INTRAMUSCULAR | Status: DC | PRN
  Administered 2021-09-03: 10 mL

## 2021-09-03 MED ORDER — FENTANYL CITRATE (PF) 100 MCG/2ML IJ SOLN
INTRAMUSCULAR | Status: AC
  Filled 2021-09-03: qty 2

## 2021-09-03 MED ORDER — DEXAMETHASONE SODIUM PHOSPHATE 10 MG/ML IJ SOLN
INTRAMUSCULAR | Status: AC
  Filled 2021-09-03: qty 1

## 2021-09-03 MED ORDER — FENTANYL CITRATE (PF) 100 MCG/2ML IJ SOLN: 25.0000 ug | INTRAMUSCULAR | Status: DC | PRN

## 2021-09-03 SURGICAL SUPPLY — 30 items
BAG DRN RND TRDRP ANRFLXCHMBR (UROLOGICAL SUPPLIES) ×1
BAG URINE DRAIN 2000ML AR STRL (UROLOGICAL SUPPLIES) ×2 IMPLANT
BLADE CLIPPER SENSICLIP SURGIC (BLADE) ×2 IMPLANT
CATH FOLEY 2WAY SLVR  5CC 16FR (CATHETERS) ×2
CATH FOLEY 2WAY SLVR 5CC 16FR (CATHETERS) ×1 IMPLANT
CATH ROBINSON RED A/P 20FR (CATHETERS) ×2 IMPLANT
CLOTH BEACON ORANGE TIMEOUT ST (SAFETY) ×2 IMPLANT
CNTNR URN SCR LID CUP LEK RST (MISCELLANEOUS) ×2 IMPLANT
CONT SPEC 4OZ STRL OR WHT (MISCELLANEOUS) ×4
COVER BACK TABLE 60X90IN (DRAPES) ×2 IMPLANT
COVER MAYO STAND STRL (DRAPES) ×2 IMPLANT
DRSG TEGADERM 4X4.75 (GAUZE/BANDAGES/DRESSINGS) ×2 IMPLANT
DRSG TEGADERM 8X12 (GAUZE/BANDAGES/DRESSINGS) ×2 IMPLANT
GAUZE SPONGE 4X4 12PLY STRL (GAUZE/BANDAGES/DRESSINGS) ×1 IMPLANT
GLOVE SURG ENC MOIS LTX SZ7.5 (GLOVE) ×4 IMPLANT
GLOVE SURG UNDER POLY LF SZ6.5 (GLOVE) ×1 IMPLANT
GLOVE SURG UNDER POLY LF SZ7 (GLOVE) ×4 IMPLANT
GOWN STRL REUS W/TWL XL LVL3 (GOWN DISPOSABLE) ×2 IMPLANT
I-Seed AgX100 ×71 IMPLANT
IMPL SPACEOAR VUE SYSTEM (Spacer) IMPLANT
IMPLANT SPACEOAR VUE SYSTEM (Spacer) ×2 IMPLANT
IV NS 1000ML (IV SOLUTION) ×4
IV NS 1000ML BAXH (IV SOLUTION) ×2 IMPLANT
KIT TURNOVER CYSTO (KITS) ×2 IMPLANT
MARKER SKIN DUAL TIP RULER LAB (MISCELLANEOUS) ×2 IMPLANT
PACK CYSTO (CUSTOM PROCEDURE TRAY) ×2 IMPLANT
SYR 10ML LL (SYRINGE) ×2 IMPLANT
TOWEL OR 17X26 10 PK STRL BLUE (TOWEL DISPOSABLE) ×2 IMPLANT
UNDERPAD 30X36 HEAVY ABSORB (UNDERPADS AND DIAPERS) ×4 IMPLANT
WATER STERILE IRR 500ML POUR (IV SOLUTION) ×2 IMPLANT

## 2021-09-03 NOTE — Transfer of Care (Signed)
Immediate Anesthesia Transfer of Care Note  Patient: Eugene Moreno  Procedure(s) Performed: RADIOACTIVE SEED IMPLANT/BRACHYTHERAPY IMPLANT (Prostate) SPACE OAR INSTILLATION (Prostate)  Patient Location: PACU  Anesthesia Type:General  Level of Consciousness: awake, alert  and oriented  Airway & Oxygen Therapy: Patient Spontanous Breathing and Patient connected to nasal cannula oxygen  Post-op Assessment: Report given to RN and Post -op Vital signs reviewed and stable  Post vital signs: Reviewed and stable  Last Vitals:  Vitals Value Taken Time  BP 108/71 09/03/21 1355  Temp    Pulse 65 09/03/21 1358  Resp 11 09/03/21 1358  SpO2 97 % 09/03/21 1358  Vitals shown include unvalidated device data.  Last Pain:  Vitals:   09/03/21 1043  TempSrc: Oral  PainSc: 0-No pain      Patients Stated Pain Goal: 5 ([id number])  Complications: No notable events documented.

## 2021-09-03 NOTE — Discharge Instructions (Addendum)
DISCHARGE INSTRUCTIONS FOR PROSTATE SEED IMPLANTATION  Removal of catheter Remove the foley catheter after 24 hours ( day after the procedure).can be done easily by cutting the side port of the catheter, whichallow the balloon to deflate.  You will see 1-2 teaspoons of clear water as the balloon deflates and then the catheter can be slid out without difficulty.   Antibiotics You may be given a prescription for an antibiotic to take when you arrive home. If so, be sure to take every tablet in the bottle, even if you are feeling better before the prescription is finished. If you begin itching, notice a rash or start to swell on your trunk, arms, legs and/or throat, immediately stop taking the antibiotic and call your Urologist. Diet Resume your usual diet when you return home. To keep your bowels moving easily and softly, drink prune, apple and cranberry juice at room temperature. You may also take a stool softener, such as Colace, which is available without prescription at local pharmacies. Daily activities No driving or heavy lifting for at least two days after the implant. No bike riding, horseback riding or riding lawn mowers for the first month after the implant. Any strenuous physical activity should be approved by your doctor before you resume it. Sexual relations You may resume sexual relations two weeks after the procedure. A condom should be used for the first two weeks. Your semen may be dark brown or black; this is normal and is related bleeding that may have occurred during the implant. Postoperative swelling Expect swelling and bruising of the scrotum and perineum (the area between the scrotum and anus). Both the swelling and the bruising should resolve in l or 2 weeks. Ice packs and over- the-counter medications such as Tylenol, Advil or Aleve may lessen your discomfort. Postoperative urination Most men experience burning on urination and/or urinary frequency. If this becomes  bothersome, contact your Urologist.  Medication can be prescribed to relieve these problems.  It is normal to have some blood in your urine for a few days after the implant. Special instructions related to the seeds It is unlikely that you will pass an Iodine-125 seed in your urine. The seeds are silver in color and are about as large as a grain of rice. If you pass a seed, do not handle it with your fingers. Use a spoon to place it in an envelope or jar in place this in base occluded area such as the garage or basement for return to the radiation clinic at your convenience.  Contact your doctor for Temperature greater than 101 F Increasing pain Inability to urinate Follow-up  You should have follow up with your urologist and radiation oncologist about 3 weeks after the procedure. General information regarding Iodine seeds Iodine-125 is a low energy radioactive material. It is not deeply penetrating and loses energy at short distances. Your prostate will absorb the radiation. Objects that are touched or used by the patient do not become radioactive. Body wastes (urine and stool) or body fluids (saliva, tears, semen or blood) are not radioactive. The Nuclear Regulatory Commission [facility]) has determined that no radiation precautions are needed for patients undergoing Iodine-125 seed implantation. The [facility] states that such patients do not present a risk to the people around them, including young children and pregnant women. However, in keeping with the general principle that radiation exposure should be kept as low reasonably possible, we suggest the following: Children and pets should not sit on the patient's lap for the  first two (2) weeks after the implant. Pregnant (or possibly pregnant) women should avoid prolonged, close contact with the patient for the first two (2) weeks after the implant. A distance of three (3) feet is acceptable. At a distance of three (3) feet, there is no limit to the length  of time anyone can be with the patient.    Post Anesthesia Home Care Instructions  Activity: Get plenty of rest for the remainder of the day. A responsible individual must stay with you for 24 hours following the procedure.  For the next 24 hours, DO NOT: -Drive a car -Paediatric nurse -Drink alcoholic beverages -Take any medication unless instructed by your physician -Make any legal decisions or sign important papers.  Meals: Start with liquid foods such as gelatin or soup. Progress to regular foods as tolerated. Avoid greasy, spicy, heavy foods. If nausea and/or vomiting occur, drink only clear liquids until the nausea and/or vomiting subsides. Call your physician if vomiting continues.  Special Instructions/Symptoms: Your throat may feel dry or sore from the anesthesia or the breathing tube placed in your throat during surgery. If this causes discomfort, gargle with warm salt water. The discomfort should disappear within 24 hours.

## 2021-09-03 NOTE — Interval H&P Note (Signed)
History and Physical Interval Note:  09/03/2021 12:20 PM  Eugene Moreno  has presented today for surgery, with the diagnosis of PROSTATE CANCER.  The various methods of treatment have been discussed with the patient and family. After consideration of risks, benefits and other options for treatment, the patient has consented to  Procedure(s): RADIOACTIVE SEED IMPLANT/BRACHYTHERAPY IMPLANT (N/A) SPACE OAR INSTILLATION (N/A) as a surgical intervention.  The patient's history has been reviewed, patient examined, no change in status, stable for surgery.  I have reviewed the patient's chart and labs.  Questions were answered to the patient's satisfaction.     Ardis Hughs

## 2021-09-03 NOTE — Op Note (Signed)
Preoperative diagnosis:  Clinical stage TI C adenocarcinoma the prostate  Postoperative diagnosis:  Same  Procedure:  #1 I-125 prostate seed implantation  #2 cystourethroscopy #3 instillation of SpaceOAR biogel  Surgeon: Louis Meckel, M.D. Radiation Oncologist: Tyler Pita, M.D.  Anesthesia: Gen.   Indications: Patient  was diagnosed with clinical stage TI C prostate cancer. We had extensive discussion with him about treatment options versus. He elected to proceed with seed implantation. He underwent consultation my office as well as with Dr. Tyler Pita. He appeared to understand the advantages disadvantages potential risks of this treatment option. Full informed consent has been obtained. The patient is had preoperative ciprofloxacin. PAS compression boots were placed.   Technique and findings: Patient was brought the operating room where he had  successful induction of general anesthesia. He was placed in lithotomy position and prepped and draped in usual manner. Appropriate surgical timeout was performed. Radiation oncology department placed a transrectal ultrasound probe anchoring stand. Foley catheter with contrast in the balloon was inserted without difficulty. Anchoring needles were placed within the prostate.  Real-time contouring of the urethra prostate and rectum were performed and the dosing parameters were established. Targeted dose was 145 gray. We then came to the operating suite suite for placement of the needles. A second timeout was performed. All needle passage was done with real-time transrectal ultrasound guidance with the sagittal plane. A total of 20 needles were placed.  70 active seeds were implanted.  The brachytherapy template was then removed.   A site in the midline was selected on the perineum for placement of an 18 g needle with saline.  The needle was advanced above the rectum and below Denonvillier's fascia to the mid gland and confirmed to be in the midline  on transverse imaging.  One cc of saline was injected confirming appropriate expansion of this space.  A total of 5 cc of saline was then injected to open the space further bilaterally.  The saline syringe was then removed and the SpaceOAR hydrogel was injected with good distribution bilaterally.A Foley catheter was removed and flexible cystoscopy failed to show any seeds outside the prostate.

## 2021-09-03 NOTE — H&P (Signed)
75 year old reasonably healthy white male who presents today for further discussion of his new diagnosis of prostate cancer. The patient underwent a prostate biopsy on 04/21/2021. He recovered well from the biopsy and has had no ongoing issues.   Prostate MRI, 04/10/2021: Region of interest in the right anterior and posterior lateral peripheral zone of the mid gland and apex, PIRAD-4. Prostate volume 18.6 g.   Prostate biopsy, 04/21/2021:  4+3=7 in the right lateral apex and mid gland. In addition, there was Gleason 3+4=7 in the right mid medial and left mid lateral cores. There was Gleason 3 + 3 in the left mid apex. In total the patient had 7/15 cores positive for prostate cancer.   Progression free probability following surgery: 64% at 5 years  Lymph node involvement: 8 %  Seminal vesicle invasion: 8%   IPSS: 0  SHIM: 16    08/10/2021  Patient has elected to proceed with brachytherapy with spaceoar. he has a history cardiac stent. This was back in 2001 according to him. He has been on aspirin 81 mg 2 tablets a day. He denies any chest pain, shortness of breath.     ALLERGIES: Niacin    MEDICATIONS: Aspirin  Crestor 5 mg tablet  Cod Liver Oil  Folic Acid  Iron  Ramipril 2.5 mg capsule  Vitamin C  Vitamin E     GU PSH: Prostate Needle Biopsy - 04/21/2021       PSH Notes: Heart stent placement (2001)   NON-GU PSH: Surgical Pathology, Gross And Microscopic Examination For Prostate Needle - 04/21/2021     GU PMH: Prostate Cancer - 04/30/2021 Elevated PSA - 04/21/2021, - 03/16/2021, - 12/11/2020, - 2020, - 2020, - 2018    NON-GU PMH: Myocardial Infarction    FAMILY HISTORY: 2 daughters - Daughter Breast Cancer - Mother heart failure - Father   SOCIAL HISTORY: Marital Status: Married Preferred Language: English; Ethnicity: Not Hispanic Or Latino; Race: White Current Smoking Status: Patient does not smoke anymore. Has not smoked since 09/19/1977. Smoked for 15 years. Smoked 1  pack per day.   Tobacco Use Assessment Completed: Used Tobacco in last 30 days? Does drink.  Drinks 2 caffeinated drinks per day. Patient's occupation is/was Retired.    REVIEW OF SYSTEMS:    GU Review Male:   Patient reports trouble starting your stream. Patient denies hard to postpone urination, leakage of urine, penile pain, have to strain to urinate , burning/ pain with urination, frequent urination, get up at night to urinate, erection problems, and stream starts and stops.  Gastrointestinal (Upper):   Patient denies nausea, vomiting, and indigestion/ heartburn.  Gastrointestinal (Lower):   Patient denies diarrhea and constipation.  Constitutional:   Patient denies fever, night sweats, weight loss, and fatigue.  Skin:   Patient denies skin rash/ lesion and itching.  Eyes:   Patient denies blurred vision and double vision.  Ears/ Nose/ Throat:   Patient denies sore throat and sinus problems.  Hematologic/Lymphatic:   Patient denies swollen glands and easy bruising.  Cardiovascular:   Patient denies leg swelling and chest pains.  Respiratory:   Patient denies cough and shortness of breath.  Endocrine:   Patient denies excessive thirst.  Musculoskeletal:   Patient denies back pain and joint pain.  Neurological:   Patient denies headaches and dizziness.  Psychologic:   Patient denies depression and anxiety.   VITAL SIGNS:      08/10/2021 03:52 PM  BP 107/68 mmHg  Pulse 67 /min  Temperature  98.0 F / 36.6 C   MULTI-SYSTEM PHYSICAL EXAMINATION:    Constitutional: Well-nourished. No physical deformities. Normally developed. Good grooming.  Respiratory: No labored breathing, no use of accessory muscles.   Cardiovascular: Normal temperature, normal extremity pulses, no swelling, no varicosities.  Skin: No paleness, no jaundice, no cyanosis. No lesion, no ulcer, no rash.  Neurologic / Psychiatric: Oriented to time, oriented to place, oriented to person. No depression, no anxiety, no  agitation.  Gastrointestinal: No mass, no tenderness, no rigidity, non obese abdomen.  Eyes: Normal conjunctivae. Normal eyelids.  Musculoskeletal: Normal gait and station of head and neck.     Complexity of Data:  Source Of History:  Patient  Records Review:   Previous Doctor Records, Previous Patient Records  Urine Test Review:   Urinalysis   03/02/21 11/03/20 01/15/19 04/05/18 12/30/17  PSA  Total PSA 8.04 ng/mL 7.58 ng/ml 3.75 ng/mL 3.82 ng/mL 4.23 ng/mL  Free PSA 0.35 ng/mL    0.22 ng/mL  % Free PSA 4 % PSA    5 % PSA    PROCEDURES:          Urinalysis Dipstick Dipstick Cont'd  Color: Yellow Bilirubin: Neg mg/dL  Appearance: Clear Ketones: Trace mg/dL  Specific Gravity: 1.030 Blood: Neg ery/uL  pH: 5.5 Protein: Trace mg/dL  Glucose: Neg mg/dL Urobilinogen: 1.0 mg/dL    Nitrites: Neg    Leukocyte Esterase: Neg leu/uL    ASSESSMENT:      ICD-10 Details  1 GU:   Prostate Cancer - C61 Chronic, Stable   PLAN:           Orders Labs Urine Culture          Document Letter(s):  Created for Patient: Clinical Summary         Notes:   Proceed with brachytherapy with spaceoar. he has a history of cardiac stent. He takes 2 81 mg aspirin a day. I told him to take only one 81 mg aspirin per day about 1 week leading up to the surgery and then he can go back to taking two a couple of days after. He expressed understanding.

## 2021-09-03 NOTE — Anesthesia Procedure Notes (Addendum)
Procedure Name: LMA Insertion Date/Time: 09/03/2021 12:28 PM Performed by: Bufford Spikes, CRNA Pre-anesthesia Checklist: Patient identified, Emergency Drugs available, Suction available and Patient being monitored Patient Re-evaluated:Patient Re-evaluated prior to induction Oxygen Delivery Method: Circle system utilized Preoxygenation: Pre-oxygenation with 100% oxygen Induction Type: IV induction Ventilation: Mask ventilation without difficulty LMA: LMA inserted LMA Size: 4.0 Number of attempts: 2 Placement Confirmation: positive ETCO2 Tube secured with: Tape Dental Injury: Teeth and Oropharynx as per pre-operative assessment

## 2021-09-03 NOTE — Anesthesia Preprocedure Evaluation (Signed)
Anesthesia Evaluation  Patient identified by MRN, date of birth, ID band Patient awake    Reviewed: Allergy & Precautions, NPO status , Patient's Chart, lab work & pertinent test results  Airway Mallampati: II  TM Distance: >3 FB Neck ROM: Full    Dental  (+) Dental Advisory Given, Teeth Intact   Pulmonary neg pulmonary ROS, former smoker,    Pulmonary exam normal breath sounds clear to auscultation       Cardiovascular hypertension, + CAD and + Cardiac Stents  Normal cardiovascular exam Rhythm:Regular Rate:Normal     Neuro/Psych negative neurological ROS     GI/Hepatic negative GI ROS, Neg liver ROS,   Endo/Other  negative endocrine ROS  Renal/GU negative Renal ROS     Musculoskeletal negative musculoskeletal ROS (+)   Abdominal   Peds  Hematology negative hematology ROS (+)   Anesthesia Other Findings   Reproductive/Obstetrics                             Anesthesia Physical Anesthesia Plan  ASA: 3  Anesthesia Plan: General   Post-op Pain Management:    Induction: Intravenous  PONV Risk Score and Plan: 3 and Ondansetron, Dexamethasone, Treatment may vary due to age or medical condition and Midazolam  Airway Management Planned: LMA  Additional Equipment:   Intra-op Plan:   Post-operative Plan: Extubation in OR  Informed Consent: I have reviewed the patients History and Physical, chart, labs and discussed the procedure including the risks, benefits and alternatives for the proposed anesthesia with the patient or authorized representative who has indicated his/her understanding and acceptance.     Dental advisory given  Plan Discussed with: CRNA  Anesthesia Plan Comments:         Anesthesia Quick Evaluation

## 2021-09-04 ENCOUNTER — Encounter (HOSPITAL_BASED_OUTPATIENT_CLINIC_OR_DEPARTMENT_OTHER): Payer: Self-pay | Admitting: Urology

## 2021-09-04 NOTE — Anesthesia Postprocedure Evaluation (Signed)
Anesthesia Post Note  Patient: Eugene Moreno  Procedure(s) Performed: RADIOACTIVE SEED IMPLANT/BRACHYTHERAPY IMPLANT (Prostate) SPACE OAR INSTILLATION (Prostate)     Patient location during evaluation: PACU Anesthesia Type: General Level of consciousness: sedated and patient cooperative Pain management: pain level controlled Vital Signs Assessment: post-procedure vital signs reviewed and stable Respiratory status: spontaneous breathing Cardiovascular status: stable Anesthetic complications: no   No notable events documented.  Last Vitals:  Vitals:   09/03/21 1415 09/03/21 1530  BP: 119/76 123/76  Pulse: (!) 57 (!) 50  Resp: 10 16  Temp:  (!) 36.3 C  SpO2: 94% 100%    Last Pain:  Vitals:   09/03/21 1530  TempSrc:   PainSc: 0-No pain                 Nolon Nations

## 2021-09-04 NOTE — Progress Notes (Signed)
  Radiation Oncology         [phone]) [phone] ________________________________  Name: Eugene Moreno MRN: [id number]  Date: 09/04/2021  DOB: 1946-05-27       Prostate Seed Implant  [id number], Abigail Butts, MD  No ref. provider found  DIAGNOSIS:  Oncology History  Malignant neoplasm of prostate ([facility])  04/21/2021 Cancer Staging   Staging form: Prostate, AJCC 8th Edition - Clinical stage from 04/21/2021: Stage IIC (cT1c, cN0, cM0, PSA: 8, Grade Group: 3) - Signed by Freeman Caldron, PA-C on 06/12/2021 Histopathologic type: Adenocarcinoma, NOS Stage prefix: Initial diagnosis Prostate specific antigen (PSA) range: Less than 10 Gleason primary pattern: 4 Gleason secondary pattern: 3 Gleason score: 7 Histologic grading system: 5 grade system Number of biopsy cores examined: 15 Number of biopsy cores positive: 7 Location of positive needle core biopsies: Both sides   06/12/2021 Initial Diagnosis   Malignant neoplasm of prostate ([facility])     No diagnosis found.  PROCEDURE: Insertion of radioactive I-125 seeds into the prostate gland.  RADIATION DOSE: 145 Gy, definitive therapy.  TECHNIQUE: TRIGG BANKOWSKI was brought to the operating room with the urologist. He was placed in the dorsolithotomy position. He was catheterized and a rectal tube was inserted. The perineum was shaved, prepped and draped. The ultrasound probe was then introduced into the rectum to see the prostate gland.  TREATMENT DEVICE: A needle grid was attached to the ultrasound probe stand and anchor needles were placed.  3D PLANNING: The prostate was imaged in 3D using a sagittal sweep of the prostate probe. These images were transferred to the planning computer. There, the prostate, urethra and rectum were defined on each axial reconstructed image. Then, the software created an optimized 3D plan and a few seed positions were adjusted. The quality of the plan was reviewed using [facility] information for the target and the following two  organs at risk:  Urethra and Rectum.  Then the accepted plan was printed and handed off to the radiation therapist.  Under my supervision, the custom loading of the seeds and spacers was carried out and loaded into sealed vicryl sleeves.  These pre-loaded needles were then placed into the needle holder.Marland Kitchen  PROSTATE VOLUME STUDY:  Using transrectal ultrasound the volume of the prostate was verified to be 24.3 cc.  SPECIAL TREATMENT PROCEDURE/SUPERVISION AND HANDLING: The pre-loaded needles were then delivered under sagittal guidance. A total of 20 needles were used to deposit 71 seeds in the prostate gland. The individual seed activity was 0.300 mCi.  SpaceOAR:  Yes  COMPLEX SIMULATION: At the end of the procedure, an anterior radiograph of the pelvis was obtained to document seed positioning and count. Cystoscopy was performed to check the urethra and bladder.  MICRODOSIMETRY: At the end of the procedure, the patient was emitting 0.058 mR/hr at 1 meter. Accordingly, he was considered safe for hospital discharge.  PLAN: The patient will return to the radiation oncology clinic for post implant CT dosimetry in three weeks.   ________________________________  Sheral Apley Tammi Klippel, M.D.

## 2021-09-15 ENCOUNTER — Telehealth: Payer: Self-pay | Admitting: *Deleted

## 2021-09-15 NOTE — Telephone Encounter (Signed)
RETURNED PATIENT'S PHONE CALL, SPOKE WITH PATIENT. ?

## 2021-09-30 ENCOUNTER — Telehealth: Payer: Self-pay | Admitting: *Deleted

## 2021-09-30 NOTE — Telephone Encounter (Signed)
CALLED PATIENT TO REMIND OF POST SEED APPTS. FOR 10-02-21, LVM FOR A RETURN CALL

## 2021-10-01 NOTE — Progress Notes (Signed)
  Radiation Oncology         [phone] ________________________________  Name: Eugene Moreno MRN: [id number]  Date: 10/02/2021  DOB: 1946-04-10  COMPLEX SIMULATION NOTE  NARRATIVE:  The patient was brought to the [facility] suite today following prostate seed implantation approximately one month ago.  Identity was confirmed.  All relevant records and images related to the planned course of therapy were reviewed.  Then, the patient was set-up supine.  CT images were obtained.  The CT images were loaded into the planning software.  Then the prostate and rectum were contoured.  Treatment planning then occurred.  The implanted iodine 125 seeds were identified by the physics staff for projection of radiation distribution  I have requested : 3D Simulation  I have requested a DVH of the following structures: Prostate and rectum.    ________________________________  Sheral Apley Tammi Klippel, M.D.

## 2021-10-01 NOTE — Progress Notes (Signed)
Radiation Oncology         (336) [phone] ________________________________  Name: Eugene Moreno MRN: [id number]  Date: 10/02/2021  DOB: May 21, 1946  Post-Seed Follow-Up Visit Note  CC: Cari Caraway, MD  Ardis Hughs, MD  Diagnosis:   75 y.o. gentleman with Stage T1c adenocarcinoma of the prostate with Gleason Score of 4+3, and PSA of 8.04.    ICD-10-CM   1. Malignant neoplasm of prostate ([facility])  C61       Interval Since Last Radiation:  4 weeks 09/03/21:  Insertion of radioactive I-125 seeds into the prostate gland; 145 Gy, definitive therapy with placement of SpaceOAR gel.  Narrative:  The patient returns today for routine follow-up.  He is complaining of increased urinary frequency and urinary hesitation symptoms. He filled out a questionnaire regarding urinary function today providing and overall IPSS score of 19 characterizing his symptoms as moderate with nocturia x2, hesitancy, weaker flow of stream, mild dysuria at the start of his stream, urgency and frequency but denies gross hematuria or incontinence.  His pre-implant score was 3.  He is taking Flomax daily as prescribed.  He denies any abdominal pain or bowel symptoms.  He denies any significant impact on his energy level and overall, is pleased with his progress to date.  ALLERGIES:  is allergic to niacin and related.  Meds: Current Outpatient Medications  Medication Sig Dispense Refill   aspirin EC 81 MG tablet Take 2 tablets (162 mg total) by mouth daily.     Cetirizine HCl (ZYRTEC ALLERGY) 10 MG CAPS Take 1 capsule by mouth daily.     Cod Liver Oil CAPS Take 1 capsule by mouth daily.     ferrous fumarate (HEMOCYTE - 106 MG FE) 325 (106 FE) MG TABS Take 1 tablet by mouth daily.     folic acid (FOLVITE) 967 MCG tablet Take 800 mcg by mouth daily.      ramipril (ALTACE) 2.5 MG capsule TAKE 1 CAPSULE BY MOUTH  DAILY (Patient taking differently: Take 2.5 mg by mouth daily.) 90 capsule 0   rosuvastatin (CRESTOR) 5  MG tablet TAKE 1 TABLET BY MOUTH AT  BEDTIME. PLEASE SCHEDULE  OFFICE VISIT BEFORE ANY  FUTURE REFILLS (Patient taking differently: Take 5 mg by mouth daily.) 90 tablet 3   tamsulosin (FLOMAX) 0.4 MG CAPS capsule Take 1 capsule (0.4 mg total) by mouth daily. 30 capsule 5   traMADol (ULTRAM) 50 MG tablet Take 1-2 tablets (50-100 mg total) by mouth every 6 (six) hours as needed for moderate pain. 15 tablet 0   vitamin C (ASCORBIC ACID) 500 MG tablet Take 1,000 mg by mouth daily.      vitamin E 400 UNIT capsule Take 400 Units by mouth daily.     No current facility-administered medications for this visit.    Physical Findings: In general this is a well appearing Caucasian male in no acute distress. He's alert and oriented x4 and appropriate throughout the examination. Cardiopulmonary assessment is negative for acute distress and he exhibits normal effort.   Lab Findings: Lab Results  Component Value Date   WBC 3.7 (L) 08/31/2021   HGB 15.6 08/31/2021   HCT 44.6 08/31/2021   MCV 93.7 08/31/2021   PLT 175 08/31/2021    Radiographic Findings:  Patient underwent CT imaging in our clinic for post implant dosimetry. The CT will be reviewed by Dr. Tammi Klippel to confirm there is an adequate distribution of radioactive seeds throughout the prostate gland and ensure that there  are no seeds in or near the rectum.  We suspect the final radiation plan and dosimetry will show appropriate coverage of the prostate gland. He understands that we will call and inform him of any unexpected findings on further review of his imaging and dosimetry.  Impression/Plan: 75 y.o. gentleman with Stage T1c adenocarcinoma of the prostate with Gleason Score of 4+3, and PSA of 8.04. The patient is recovering from the effects of radiation. His urinary symptoms should gradually improve over the next 4-6 months. We talked about this today. He is encouraged by his improvement already and is otherwise pleased with his outcome. We also  talked about long-term follow-up for prostate cancer following seed implant. He understands that ongoing PSA determinations and digital rectal exams will help perform surveillance to rule out disease recurrence. He saw Jiles Crocker, NP on 09/24/2021 and has a follow up appointment scheduled for labs on 12/08/2021 and will see Dr. Louis Meckel the following week. He understands what to expect with his PSA measures. Patient was also educated today about some of the long-term effects from radiation including a small risk for rectal bleeding and possibly erectile dysfunction. We talked about some of the general management approaches to these potential complications. However, I did encourage the patient to contact our office or return at any point if he has questions or concerns related to his previous radiation and prostate cancer.    Nicholos Johns, PA-C

## 2021-10-02 ENCOUNTER — Encounter: Payer: Self-pay | Admitting: Urology

## 2021-10-02 ENCOUNTER — Ambulatory Visit: Payer: Self-pay | Admitting: Urology

## 2021-10-02 ENCOUNTER — Ambulatory Visit
Admission: RE | Admit: 2021-10-02 | Discharge: 2021-10-02 | Disposition: A | Discharge status: Home / Self Care | Payer: Medicare Other | Source: Ambulatory Visit | Attending: Radiation Oncology | Admitting: Radiation Oncology

## 2021-10-02 ENCOUNTER — Other Ambulatory Visit: Payer: Self-pay

## 2021-10-02 ENCOUNTER — Ambulatory Visit: Payer: Medicare Other | Admitting: Radiation Oncology

## 2021-10-02 ENCOUNTER — Ambulatory Visit
Admission: RE | Admit: 2021-10-02 | Discharge: 2021-10-02 | Disposition: A | Discharge status: Home / Self Care | Payer: Medicare Other | Source: Ambulatory Visit | Attending: Urology | Admitting: Urology

## 2021-10-02 VITALS — BP 111/83 | HR 70 | Temp 98.0°F | Resp 20 | Ht 71.0 in | Wt 203.4 lb

## 2021-10-02 DIAGNOSIS — Z79899 Other long term (current) drug therapy: Secondary | ICD-10-CM | POA: Insufficient documentation

## 2021-10-02 DIAGNOSIS — R3911 Hesitancy of micturition: Secondary | ICD-10-CM | POA: Diagnosis not present

## 2021-10-02 DIAGNOSIS — Z7982 Long term (current) use of aspirin: Secondary | ICD-10-CM | POA: Insufficient documentation

## 2021-10-02 DIAGNOSIS — C61 Malignant neoplasm of prostate: Secondary | ICD-10-CM | POA: Insufficient documentation

## 2021-10-02 DIAGNOSIS — Z923 Personal history of irradiation: Secondary | ICD-10-CM | POA: Diagnosis not present

## 2021-10-02 DIAGNOSIS — R350 Frequency of micturition: Secondary | ICD-10-CM | POA: Diagnosis not present

## 2021-10-02 NOTE — Progress Notes (Signed)
Patient reports mild dysuria, nocturia x2. I-PSS Score of 19 (moderate).  Flomax 0.4mg  taken as directed. Urology follow-up was x1 week ago. No future urology follow-ups scheduled as of yet-per patient.  Meaningful use complete.  BP 111/83 (BP Location: Left Arm, Patient Position: Sitting, Cuff Size: Large)   Pulse 70   Temp 98 F (36.7 C) (Temporal)   Resp 20   Ht 5\' 11"  (1.803 m)   Wt 203 lb 6.4 oz (92.3 kg)   SpO2 97%   BMI 28.37 kg/m

## 2021-10-20 ENCOUNTER — Telehealth: Payer: Self-pay | Admitting: *Deleted

## 2021-10-21 ENCOUNTER — Encounter: Payer: Self-pay | Admitting: Radiation Oncology

## 2021-10-21 ENCOUNTER — Ambulatory Visit
Admission: RE | Admit: 2021-10-21 | Discharge: 2021-10-21 | Disposition: A | Discharge status: Home / Self Care | Payer: Medicare Other | Source: Ambulatory Visit | Attending: Radiation Oncology | Admitting: Radiation Oncology

## 2021-10-21 DIAGNOSIS — C61 Malignant neoplasm of prostate: Secondary | ICD-10-CM | POA: Insufficient documentation

## 2021-10-22 ENCOUNTER — Telehealth: Payer: Self-pay | Admitting: *Deleted

## 2021-11-05 ENCOUNTER — Ambulatory Visit: Payer: Medicare Other | Admitting: Cardiology

## 2021-11-06 ENCOUNTER — Other Ambulatory Visit: Payer: Self-pay | Admitting: Cardiology

## 2021-11-10 ENCOUNTER — Other Ambulatory Visit: Payer: Self-pay | Admitting: Cardiology

## 2021-11-15 NOTE — Progress Notes (Signed)
  Radiation Oncology         (336) [phone] ________________________________  Name: Eugene Moreno MRN: [id number]  Date: 10/21/2021  DOB: 1946/10/13  3D Planning Note   Prostate Brachytherapy Post-Implant Dosimetry  Diagnosis: 75 y.o. gentleman with Stage T1c adenocarcinoma of the prostate with Gleason score of 4+3, and PSA of 8.04.  Narrative: On a previous date, Eugene Moreno returned following prostate seed implantation for post implant planning. He underwent CT scan complex simulation to delineate the three-dimensional structures of the pelvis and demonstrate the radiation distribution.  Since that time, the seed localization, and complex isodose planning with dose volume histograms have now been completed.  Results:   Prostate Coverage - The dose of radiation delivered to the 90% or more of the prostate gland (D90) was 107.29% of the prescription dose. This exceeds our goal of greater than 90%. Rectal Sparing - The volume of rectal tissue receiving the prescription dose or higher was 0.0 cc. This falls under our thresholds tolerance of 1.0 cc.  Impression: The prostate seed implant appears to show adequate target coverage and appropriate rectal sparing.  Plan:  The patient will continue to follow with urology for ongoing PSA determinations. I would anticipate a high likelihood for local tumor control with minimal risk for rectal morbidity.  ________________________________  Sheral Apley Tammi Klippel, M.D.

## 2021-11-23 ENCOUNTER — Ambulatory Visit: Payer: Medicare Other | Admitting: Cardiology

## 2021-12-20 ENCOUNTER — Other Ambulatory Visit: Payer: Self-pay | Admitting: Cardiology

## 2022-01-04 ENCOUNTER — Ambulatory Visit: Payer: Medicare Other | Admitting: Cardiology

## 2022-01-08 ENCOUNTER — Telehealth: Payer: Self-pay | Admitting: *Deleted

## 2022-01-14 ENCOUNTER — Telehealth: Payer: Self-pay | Admitting: *Deleted

## 2022-01-23 ENCOUNTER — Other Ambulatory Visit: Payer: Self-pay | Admitting: Cardiology

## 2022-02-23 ENCOUNTER — Other Ambulatory Visit: Payer: Self-pay

## 2022-02-23 ENCOUNTER — Inpatient Hospital Stay: Payer: Medicare Other | Attending: Urology | Admitting: *Deleted

## 2022-02-23 ENCOUNTER — Encounter: Payer: Self-pay | Admitting: *Deleted

## 2022-02-23 DIAGNOSIS — C61 Malignant neoplasm of prostate: Secondary | ICD-10-CM

## 2022-02-23 NOTE — Progress Notes (Signed)
?  2 Identifiers used for verification purposes only. No vital signs taken as this a telephone visit. SCP reviewed and completed. Pt denies pain and fatigue. Pt has very few side effects remaining from the radiation. He does have some urinary urgency mainly at daytime. Pt says he only gets up once at night to use bathroom. Urine flow is normal with the use of Flomax, he says. Recently, pt said he had some pre-cancerous areas removed from face by his dermatologist. He will continue to see doctor on a regular basis. He has seen PCP in 2022. Will see cardiologist tomorrow for check-up. Last colonoscopy was in 2021. Pt did admit he has not been exercising but plans on starting back. I did discuss with him the benefits of exercise in lieu of cancer. Pt's diet choices are excellent. He attributes that to his wife. Vaccinations are up-to-date. Last PSA was 0.1. ?

## 2022-02-24 ENCOUNTER — Encounter: Payer: Self-pay | Admitting: Cardiology

## 2022-02-24 ENCOUNTER — Ambulatory Visit (INDEPENDENT_AMBULATORY_CARE_PROVIDER_SITE_OTHER): Payer: Medicare Other | Admitting: Cardiology

## 2022-02-24 ENCOUNTER — Other Ambulatory Visit: Payer: Self-pay

## 2022-02-24 VITALS — BP 130/80 | HR 56 | Ht 71.0 in | Wt 203.4 lb

## 2022-02-24 DIAGNOSIS — I2510 Atherosclerotic heart disease of native coronary artery without angina pectoris: Secondary | ICD-10-CM

## 2022-02-24 DIAGNOSIS — I10 Essential (primary) hypertension: Secondary | ICD-10-CM | POA: Diagnosis not present

## 2022-02-24 DIAGNOSIS — E7800 Pure hypercholesterolemia, unspecified: Secondary | ICD-10-CM

## 2022-02-24 MED ORDER — ROSUVASTATIN CALCIUM 10 MG PO TABS: 10.0000 mg | ORAL_TABLET | Freq: Every day | ORAL | 3 refills | Status: DC

## 2022-02-24 MED ORDER — RAMIPRIL 2.5 MG PO CAPS: 2.5000 mg | ORAL_CAPSULE | Freq: Every day | ORAL | 3 refills | Status: DC

## 2022-02-24 NOTE — Progress Notes (Signed)
?Cardiology Office Note:   ? ?Date:  02/24/2022  ? ?ID:  Eugene Moreno, DOB 1946-09-10, MRN [id number] ? ?PCP:  Cari Caraway, MD  ?Cardiologist:  Fransico Him, MD   ? ?Referring MD: Cari Caraway, MD  ? ?Chief Complaint  ?Patient presents with  ? Coronary Artery Disease  ? Hypertension  ? Hyperlipidemia  ? ? ?History of Present Illness:   ? ?Eugene Moreno is a 76 y.o. 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   ? ?He is here today for followup and is doing well.  He denies any chest pain or pressure, SOB, DOE, PND, orthopnea, LE edema, dizziness, palpitations or syncope. He is compliant with his meds and is tolerating meds with no SE.    ? ?Past Medical History:  ?Diagnosis Date  ? Coronary artery disease   ? cardiologist--- dr t. Hope Brandenburger;  hx AWMI 09-06-2001  s/p cath with PCI and DES x1 to prox LAD;  last nuclear study 05-10-2016 in epic, anterior scar without ishcemia, nuclear ef 44%)  ? History of MI (myocardial infarction) 09/06/2001  ? anterior wall  ? Hyperlipemia   ? Hypertension   ? Leukopenia   ? chronic mild  ? 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 previously followed by oncology-- dr Willene Hatchet in epic 11-05-2016  ? Prostate cancer ([place])   ? urologist-- dr Louis Meckel--- dx 05/ 2022, Gleason 4+3, PSA 8.04  ? S/P drug eluting coronary stent placement 09/06/2001  ? x1 to prox LAD  ? Wears glasses   ? Wears hearing aid in both ears   ? ? ?Past Surgical History:  ?Procedure Laterality Date  ? ANTERIOR CERVICAL DECOMP/DISCECTOMY FUSION  01/26/2002  ? [facility]  by dr Joya Salm;   C6-C7  ? COLONOSCOPY  2020  ? CORONARY ANGIOPLASTY WITH STENT PLACEMENT  09/06/2001  ? [facility]  by dr s. Vidal Schwalbe;   PCI and DES x1 to prox LAD  ? RADIOACTIVE SEED IMPLANT N/A 09/03/2021  ? Procedure: RADIOACTIVE SEED IMPLANT/BRACHYTHERAPY IMPLANT;  Surgeon: Ardis Hughs, MD;  Location: [facility];  Service:  Urology;  Laterality: N/A;  ? REVISION AMPUTATION OF FINGER    ? 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 SPACE OAR INSTILLATION N/A 09/03/2021  ? Procedure: SPACE OAR INSTILLATION;  Surgeon: Ardis Hughs, MD;  Location: [facility];  Service: Urology;  Laterality: N/A;  ? WISDOM TOOTH EXTRACTION    ? ? ?Current Medications: ?Current Meds  ?Medication Sig  ? aspirin EC 81 MG tablet Take 2 tablets (162 mg total) by mouth daily.  ? Cetirizine HCl (ZYRTEC ALLERGY) 10 MG CAPS Take 1 capsule by mouth daily.  ? Cod Liver Oil CAPS Take 2 capsules by mouth daily.  ? ferrous fumarate (HEMOCYTE - 106 MG FE) 325 (106 FE) MG TABS Take 1 tablet by mouth daily.  ? folic acid (FOLVITE) 149 MCG tablet Take 800 mcg by mouth daily.   ? ramipril (ALTACE) 2.5 MG capsule TAKE 1 CAPSULE BY MOUTH DAILY.  ? rosuvastatin (CRESTOR) 5 MG tablet TAKE 1 TABLET BY MOUTH DAILY  ? tamsulosin (FLOMAX) 0.4 MG CAPS capsule Take 1 capsule (0.4 mg total) by mouth daily.  ? traMADol (ULTRAM) 50 MG tablet Take 1-2 tablets (50-100 mg total) by mouth every 6 (six) hours as needed for moderate pain.  ? vitamin C (ASCORBIC ACID) 500 MG  tablet Take 1,000 mg by mouth daily.   ? vitamin E 400 UNIT capsule Take 400 Units by mouth daily.  ?  ? ?Allergies:   Niacin and related  ? ?Social History  ? ?Socioeconomic History  ? Marital status: Married  ?  Spouse name: Not on file  ? Number of children: Not on file  ? Years of education: Not on file  ? Highest education level: Not on file  ?Occupational History  ? Occupation: retired  ?  Employer: [employer]  ?  Comment: Gaffer  ?Tobacco Use  ? Smoking status: Former  ?  Years: 4.00  ?  Types: Cigarettes  ?  Quit date: 1969  ?  Years since quitting: 54.2  ? Smokeless tobacco: Never  ?Vaping Use  ? Vaping Use: Never used  ?Substance and Sexual Activity  ? Alcohol use: Yes  ?  Comment: occasion  ? Drug use: Never  ? Sexual activity: Yes  ?  Comment: vasectomy  ?Other  Topics Concern  ? Not on file  ?Social History Narrative  ? Not on file  ? ?Social Determinants of Health  ? ?Financial Resource Strain: Not on file  ?Food Insecurity: Not on file  ?Transportation Needs: Not on file  ?Physical Activity: Not on file  ?Stress: Not on file  ?Social Connections: Not on file  ?  ? ?Family History: ?The patient's family history includes Breast cancer (age of onset: 58) in his mother; Heart attack in his father; Other (age of onset: 45) in his sister. ? ?ROS:   ?Please see the history of present illness.    ?ROS  ?All other systems reviewed and negative.  ? ?EKGs/Labs/Other Studies Reviewed:   ? ?The following studies were reviewed today: ?none ? ?EKG:  EKG is  ordered today.  The ekg ordered today demonstrates Sinus bradycardia with iRBBB and LAFB ? ?Recent Labs: ?08/31/2021: ALT 37; BUN 16; Creatinine, Ser 0.83; Hemoglobin 15.6; Platelets 175; Potassium 4.2; Sodium 139  ? ?Recent Lipid Panel ?   ?Component Value Date/Time  ? CHOL 130 10/08/2020 0932  ? TRIG 103 10/08/2020 0932  ? HDL 38 (L) 10/08/2020 0932  ? CHOLHDL 3.4 10/08/2020 0932  ? CHOLHDL 3.2 04/26/2016 0942  ? VLDL 23 04/26/2016 0942  ? Vista 73 10/08/2020 0932  ? ? ?Physical Exam:   ? ?VS:  BP 130/80   Pulse (!) 56   Ht '5\' 11"'$  (1.803 m)   Wt 203 lb 6.4 oz (92.3 kg)   SpO2 98%   BMI 28.37 kg/m?    ? ?Wt Readings from Last 3 Encounters:  ?02/24/22 203 lb 6.4 oz (92.3 kg)  ?10/02/21 203 lb 6.4 oz (92.3 kg)  ?09/03/21 199 lb 12.8 oz (90.6 kg)  ?  ? ?GEN: Well nourished, well developed in no acute distress ?HEENT: Normal ?NECK: No JVD; No carotid bruits ?LYMPHATICS: No lymphadenopathy ?CARDIAC:RRR, no murmurs, rubs, gallops ?RESPIRATORY:  Clear to auscultation without rales, wheezing or rhonchi  ?ABDOMEN: Soft, non-tender, non-distended ?MUSCULOSKELETAL:  No edema; No deformity  ?SKIN: Warm and dry ?NEUROLOGIC:  Alert and oriented x 3 ?PSYCHIATRIC:  Normal affect   ?ASSESSMENT:   ? ?1. Coronary artery disease involving native  coronary artery of native heart without angina pectoris   ?2. Pure hypercholesterolemia   ?3. Essential hypertension   ? ?PLAN:   ? ?In order of problems listed above: ? ?1.  ASCAD ?-s/p AWMI 2002 s/p PCI of the LAD with last nuclear stress test 04/2016 with  anterior scar but no ischemia. ?-He denies any anginal symptoms ?-continue ASA '81mg'$  daily and statin ? ?2.  HLD ?-LDL goal < 70 ?-I have personally reviewed and interpreted outside labs performed by patient's PCP which showed LDL 74, HDL 35, triglycerides 146, ALT 20 on 10/26/2021 ?-I recommended we increase Crestor to 10 mg daily and repeat an FLP and ALT in 6 weeks ? ?3.  HTN ?-BP is adequately controlled on exam ?-Continue prescription drug management with ramipril 2.5 mg daily with as needed refills ?-I have personally reviewed and interpreted outside labs performed by patient's PCP which showed serum creatinine 0.89 and potassium 4.6 on 10/26/2021 ? ? ?Medication Adjustments/Labs and Tests Ordered: ?Current medicines are reviewed at length with the patient today.  Concerns regarding medicines are outlined above.  ?Orders Placed This Encounter  ?Procedures  ? EKG 12-Lead  ? ?No orders of the defined types were placed in this encounter. ? ? ?Signed, ?Fransico Him, MD  ?02/24/2022 10:01 AM    ?[place] ?

## 2022-02-24 NOTE — Addendum Note (Signed)
Addended by: Antonieta Iba on: 02/24/2022 10:06 AM ? ? Modules accepted: Orders ? ?

## 2022-02-24 NOTE — Patient Instructions (Signed)
Medication Instructions:  ?Your physician has recommended you make the following change in your medication: ?1) INCREASE Crestor (rosuvastatin) to 10 mg daily  ?*If you need a refill on your cardiac medications before your next appointment, please call your pharmacy* ? ? ?Lab Work: ?Fasting Lipids and ALT in 6 weeks ?If you have labs (blood work) drawn today and your tests are completely normal, you will receive your results only by: ?MyChart Message (if you have MyChart) OR ?A paper copy in the mail ?If you have any lab test that is abnormal or we need to change your treatment, we will call you to review the results. ? ?Follow-Up: ?At [facility], you and your health needs are our priority.  As part of our continuing mission to provide you with exceptional heart care, we have created designated Provider Care Teams.  These Care Teams include your primary Cardiologist (physician) and Advanced Practice Providers (APPs -  Physician Assistants and Nurse Practitioners) who all work together to provide you with the care you need, when you need it. ? ?Your next appointment:   ?1 year(s) ? ?The format for your next appointment:   ?In Person ? ?Provider:   ?Fransico Him, MD   ? ?

## 2022-04-13 ENCOUNTER — Other Ambulatory Visit: Payer: Medicare Other

## 2022-04-19 ENCOUNTER — Other Ambulatory Visit: Payer: Medicare Other | Admitting: *Deleted

## 2022-04-19 DIAGNOSIS — I2510 Atherosclerotic heart disease of native coronary artery without angina pectoris: Secondary | ICD-10-CM

## 2022-04-19 DIAGNOSIS — E7800 Pure hypercholesterolemia, unspecified: Secondary | ICD-10-CM

## 2022-04-19 DIAGNOSIS — I10 Essential (primary) hypertension: Secondary | ICD-10-CM

## 2022-04-19 LAB — LIPID PANEL
Chol/HDL Ratio: 3.5 ratio (ref 0.0–5.0)
Cholesterol, Total: 130 mg/dL (ref 100–199)
HDL: 37 mg/dL — ABNORMAL LOW (ref >39)
LDL Chol Calc (NIH): 67 mg/dL (ref 0–99)
Triglycerides: 151 mg/dL — ABNORMAL HIGH (ref 0–149)
VLDL Cholesterol Cal: 26 mg/dL (ref 5–40)

## 2022-04-19 LAB — ALT: ALT: 25 IU/L (ref 0–44)

## 2022-04-20 ENCOUNTER — Telehealth: Payer: Self-pay

## 2022-04-20 ENCOUNTER — Encounter: Payer: Self-pay | Admitting: Cardiology

## 2022-04-20 DIAGNOSIS — I2510 Atherosclerotic heart disease of native coronary artery without angina pectoris: Secondary | ICD-10-CM

## 2022-04-20 DIAGNOSIS — E7800 Pure hypercholesterolemia, unspecified: Secondary | ICD-10-CM

## 2022-04-20 MED ORDER — ICOSAPENT ETHYL 1 G PO CAPS: 2.0000 g | ORAL_CAPSULE | Freq: Two times a day (BID) | ORAL | 3 refills | Status: DC

## 2022-04-20 NOTE — Telephone Encounter (Signed)
-----   Message from Sueanne Margarita, MD sent at 04/20/2022  9:58 AM EDT ----- ?LDL is at goal but triglycerides are too high.  Start Vascepa 2 g twice daily and continue Crestor.  Check FLP in 3 months ?

## 2022-04-20 NOTE — Telephone Encounter (Signed)
The patient has been notified of the result and verbalized understanding.  All questions (if any) were answered. ?Antonieta Iba, RN 04/20/2022 10:30 AM  ?Rx has been sent in. Repeat labs have been scheduled.  ?

## 2022-06-08 ENCOUNTER — Telehealth: Payer: Self-pay | Admitting: Cardiology

## 2022-06-08 ENCOUNTER — Other Ambulatory Visit: Payer: Self-pay

## 2022-06-08 MED ORDER — RAMIPRIL 2.5 MG PO CAPS: 2.5000 mg | ORAL_CAPSULE | Freq: Every day | ORAL | 2 refills | Status: DC

## 2022-06-08 NOTE — Telephone Encounter (Signed)
Pt's medication was sent to pt's pharmacy as requested. Confirmation received.  °

## 2022-06-08 NOTE — Telephone Encounter (Signed)
*  STAT* If patient is at the pharmacy, call can be transferred to refill team.   1. Which medications need to be refilled? (please list name of each medication and dose if known)  ramipril (ALTACE) 2.5 MG capsule  2. Which pharmacy/location (including street and city if local pharmacy) is medication to be sent to? [id number] [place], [place] - [address]. Do they need a 30 day or 90 day supply?  90 day supply

## 2022-06-08 NOTE — Addendum Note (Signed)
Addended by: Carter Kitten D on: 06/08/2022 10:51 AM   Modules accepted: Orders

## 2022-08-02 ENCOUNTER — Other Ambulatory Visit: Payer: Medicare Other

## 2022-08-02 DIAGNOSIS — I2510 Atherosclerotic heart disease of native coronary artery without angina pectoris: Secondary | ICD-10-CM

## 2022-08-02 DIAGNOSIS — E7800 Pure hypercholesterolemia, unspecified: Secondary | ICD-10-CM

## 2022-08-02 LAB — LIPID PANEL
Chol/HDL Ratio: 3.5 ratio (ref 0.0–5.0)
Cholesterol, Total: 124 mg/dL (ref 100–199)
HDL: 35 mg/dL — ABNORMAL LOW (ref >39)
LDL Chol Calc (NIH): 64 mg/dL (ref 0–99)
Triglycerides: 139 mg/dL (ref 0–149)
VLDL Cholesterol Cal: 25 mg/dL (ref 5–40)

## 2022-09-16 IMAGING — DX DG CHEST 2V
2 series · 2 of 2 positions shown · non-contrast
Comparison: None.

CLINICAL DATA: Preoperative evaluation.

EXAM:
CHEST - 2 VIEW

[chest pa]
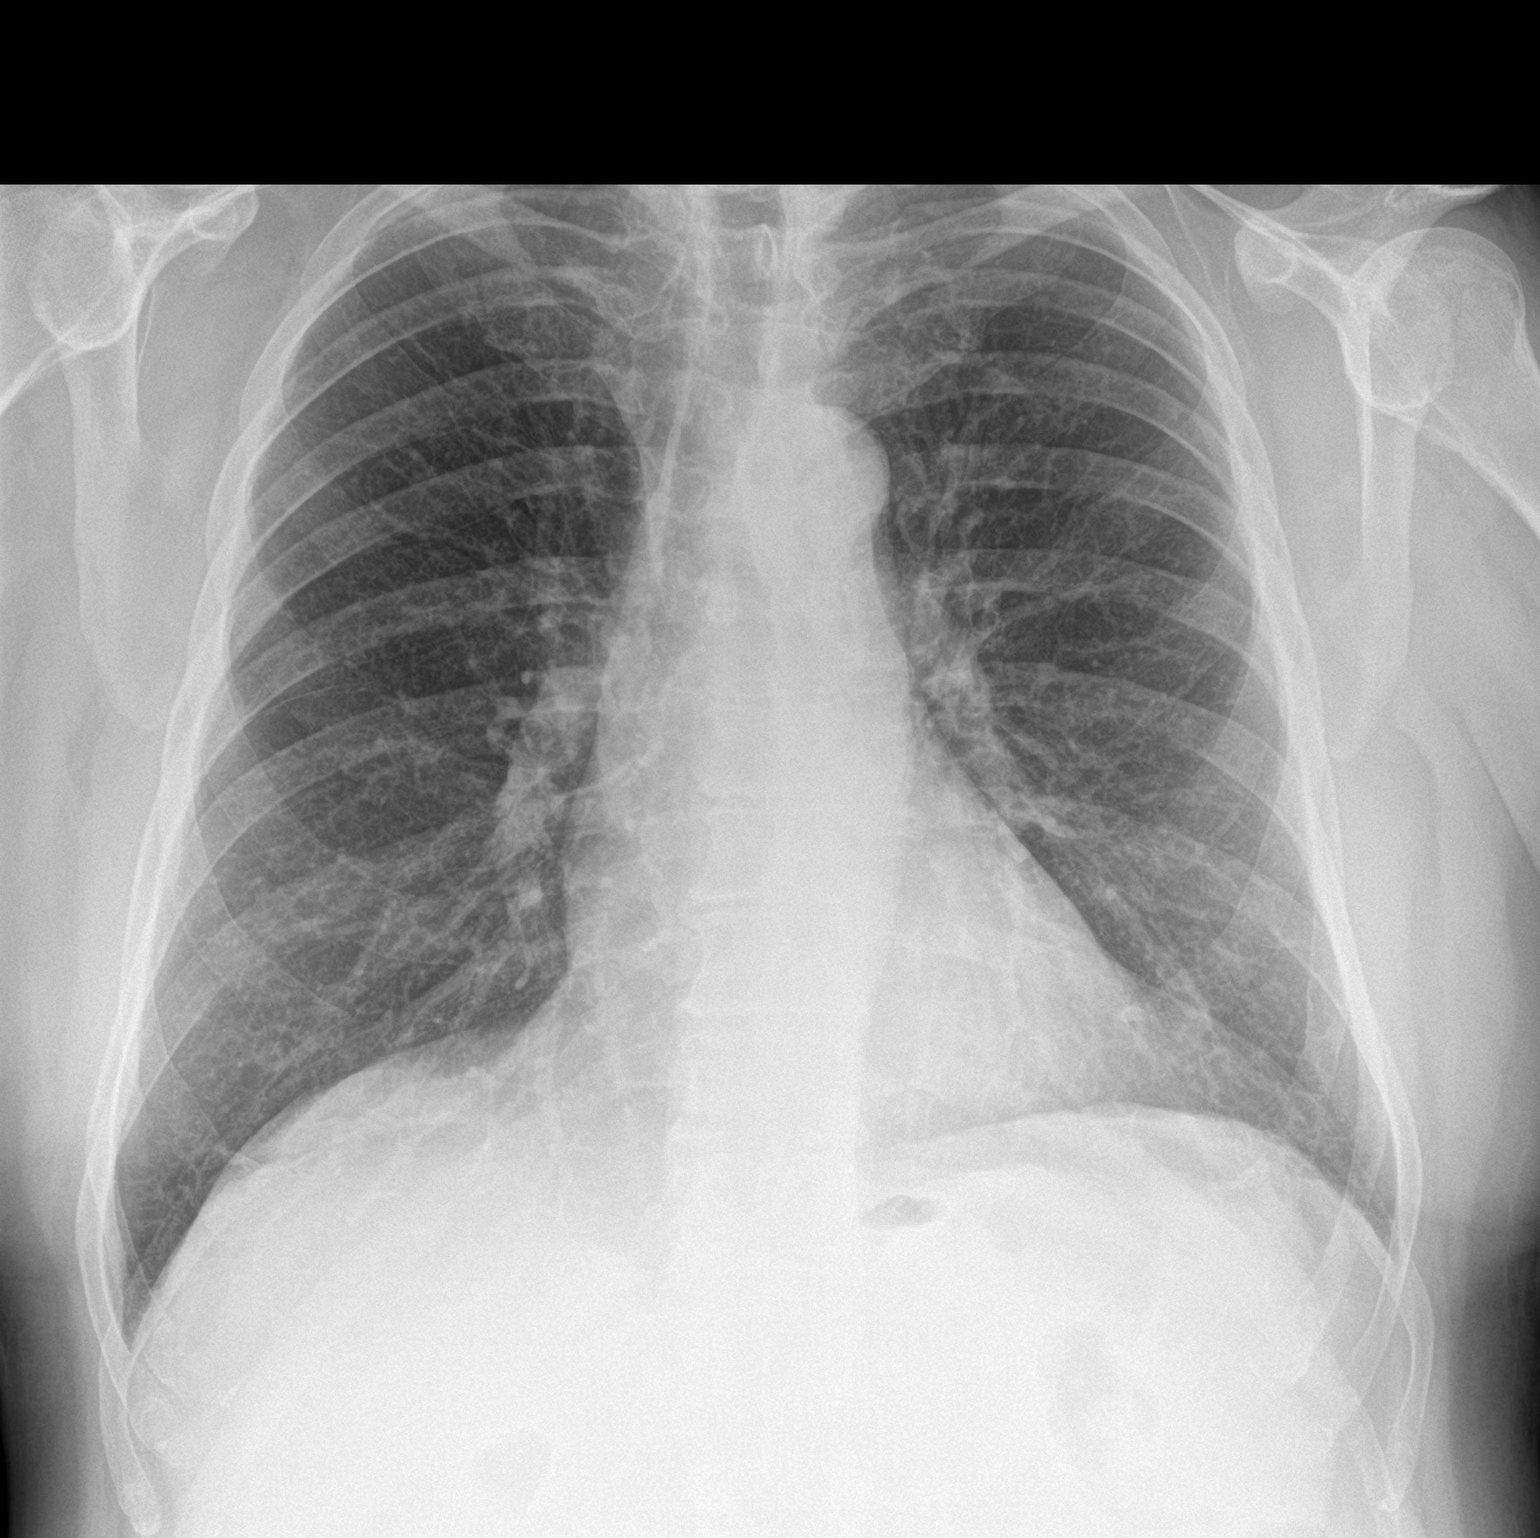

[chest lat]
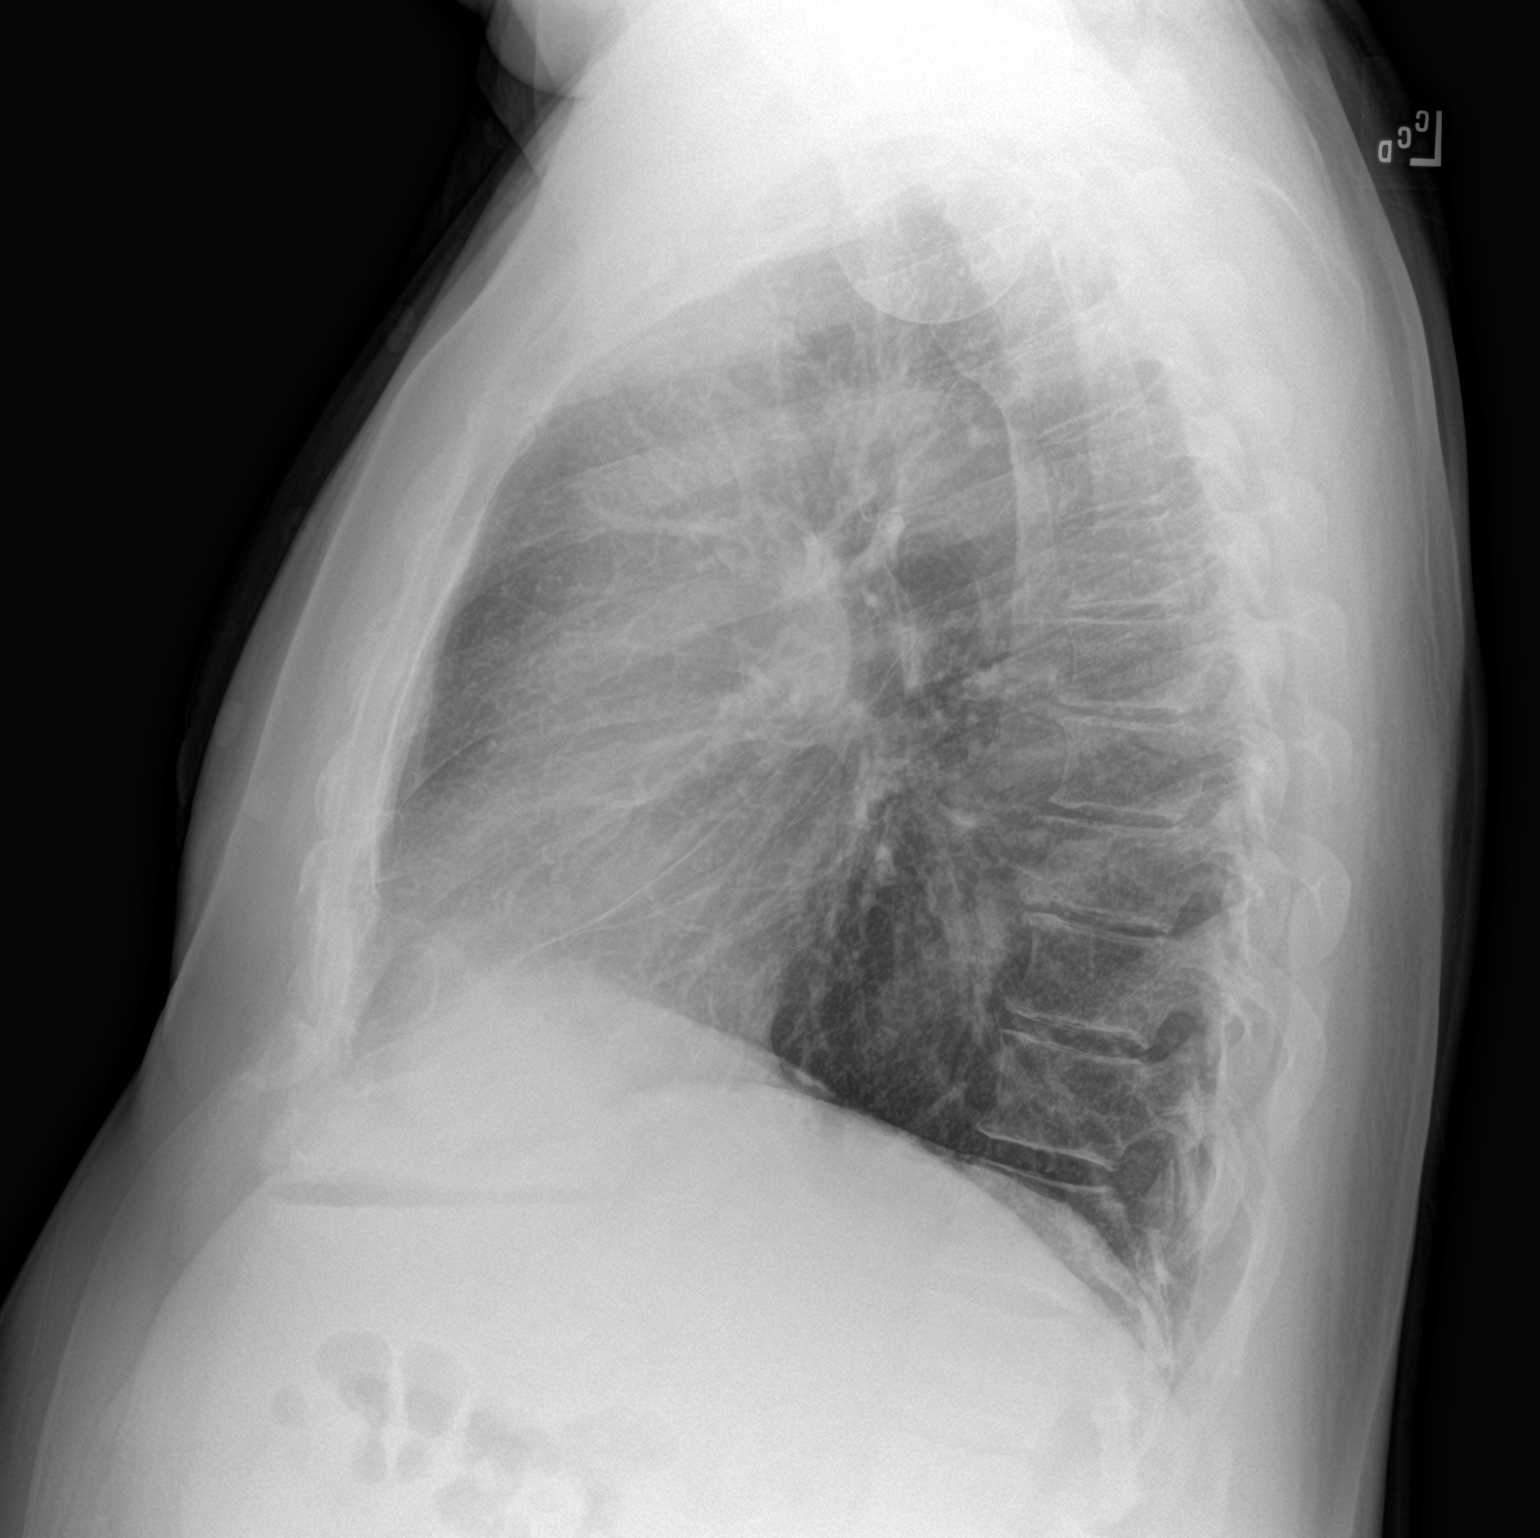

[2 of 2 positions shown; findings below may reference images not displayed]

FINDINGS: The heart size and mediastinal contours are within normal limits.
Both lungs are clear. A radiopaque fusion plate and screws are seen
overlying the lower cervical spine. The visualized skeletal
structures are unremarkable.
IMPRESSION: No active cardiopulmonary disease.

## 2023-02-10 ENCOUNTER — Other Ambulatory Visit: Payer: Self-pay | Admitting: Cardiology

## 2023-03-19 ENCOUNTER — Other Ambulatory Visit: Payer: Self-pay | Admitting: Cardiology

## 2023-03-21 ENCOUNTER — Other Ambulatory Visit: Payer: Self-pay | Admitting: Cardiology

## 2023-03-22 NOTE — Telephone Encounter (Signed)
Medication icosapent ethyl (Vascepa) is not covered by pt's insurance. A prior Josem Kaufmann is needed or a alternative. Please address

## 2023-03-23 MED ORDER — VASCEPA 1 G PO CAPS: 2.0000 g | ORAL_CAPSULE | Freq: Two times a day (BID) | ORAL | 0 refills | Status: DC

## 2023-03-23 NOTE — Telephone Encounter (Signed)
**Note De-Identified [name] Obfuscation** Vascepa PA done and approved as follows: Eugene Moreno (Key: [id number]) PA Case ID #: [id number] Need Help? Call us at [phone] Outcome This medication or product is on your plan's list of covered drugs. Prior authorization is not required at this time. Drug Vascepa 1GM capsules ePA cloud logo Form [employer] Medicare Part D Electronic Prior Authorization  I Have notified [id number] [place] [place] [address] (Ph: [phone]) of this approval and did request that they fill and to notify the pt when ready for pick up. I have also changed generic Icosapent to name brand Vascepa (DAW) 1 gram-TAKE 2 CAPSULES BY MOUTH 2 TIMES DAILY.

## 2023-03-24 ENCOUNTER — Ambulatory Visit: Payer: Medicare Other | Admitting: Cardiology

## 2023-03-27 ENCOUNTER — Encounter: Payer: Self-pay | Admitting: Cardiology

## 2023-03-28 ENCOUNTER — Ambulatory Visit: Payer: Medicare Other | Admitting: Cardiology

## 2023-03-28 NOTE — Progress Notes (Deleted)
Cardiology Office Note:    Date:  03/28/2023   ID:  Eugene Moreno, DOB 1946-05-10, MRN [id number]  PCP:  Gweneth DimitriMcNeill, Wendy, MD  Cardiologist:  Armanda Magicraci Keshawna Dix, MD    Referring MD: Gweneth DimitriMcNeill, Wendy, MD   No chief complaint on file.   History of Present Illness:    Eugene Moreno is a 77 y.o. 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    He is here today for followup and is doing well.  He denies any chest pain or pressure, SOB, DOE, PND, orthopnea, LE edema, dizziness, palpitations or syncope. He is compliant with his meds and is tolerating meds with no SE.    Past Medical History:  Diagnosis Date   Coronary artery disease    cardiologist--- dr t. 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 Maylon Costennent;   PCI and DES x1 to prox LAD   RADIOACTIVE SEED IMPLANT N/A 09/03/2021   Procedure: RADIOACTIVE SEED IMPLANT/BRACHYTHERAPY IMPLANT;  Surgeon: Crist FatHerrick, Benjamin W, MD;  [facility];  Service: Urology;  Laterality: N/A;   REVISION AMPUTATION OF FINGER     1960s---  right index finger traumatic amputation   SPACE OAR INSTILLATION N/A 09/03/2021   Procedure: SPACE OAR INSTILLATION;  Surgeon: Crist FatHerrick, Benjamin W, MD;  [facility];  Service: Urology;  Laterality: N/A;   WISDOM TOOTH EXTRACTION      Current Medications: No outpatient medications have been marked as taking for the 03/28/23 encounter (Appointment) with Quintella Reicherturner, Kandas Oliveto R, MD.     Allergies:   Niacin and related   Social History   Socioeconomic History   Marital status: Married    Spouse name: Not on file   Number of children: Not on file   Years of education: Not on file   Highest education level: Not on file  Occupational History   Occupation: retired    Associate Professormployer: [employer]    Comment: Systems analystsoftware developer  Tobacco Use   Smoking status: Former    Years: 4    Types: Cigarettes    Quit date: 1969    Years since quitting: 55.3   Smokeless tobacco: Never  Vaping Use   Vaping Use: Never used  Substance and Sexual Activity   Alcohol use: Yes    Comment: occasion   Drug use: Never   Sexual activity: Yes    Comment: vasectomy  Other Topics Concern  Not on file  Social History Narrative   Not on file   Social Determinants of Health   Financial Resource Strain: Not on file  Food Insecurity: Not on file  Transportation Needs: Not on file  Physical Activity: Not on file  Stress: Not on file  Social Connections: Not on file     Family History: The patient's family history includes Breast cancer (age of onset: 35) in his mother; Heart attack in his father; Other (age of onset: 87) in his sister.  ROS:   Please see the history of present illness.    ROS  All other systems reviewed and negative.   EKGs/Labs/Other Studies Reviewed:    The following studies were reviewed today: none  EKG:  EKG is  ordered today.  The ekg ordered today demonstrates ***  Recent Labs: 04/19/2022: ALT 25   Recent Lipid Panel    Component Value Date/Time    CHOL 124 08/02/2022 0748   TRIG 139 08/02/2022 0748   HDL 35 (L) 08/02/2022 0748   CHOLHDL 3.5 08/02/2022 0748   CHOLHDL 3.2 04/26/2016 0942   VLDL 23 04/26/2016 0942   LDLCALC 64 08/02/2022 0748    Physical Exam:    VS:  There were no vitals taken for this visit.    Wt Readings from Last 3 Encounters:  02/24/22 203 lb 6.4 oz (92.3 kg)  10/02/21 203 lb 6.4 oz (92.3 kg)  09/03/21 199 lb 12.8 oz (90.6 kg)    GEN: Well nourished, well developed in no acute distress HEENT: Normal NECK: No JVD; No carotid bruits LYMPHATICS: No lymphadenopathy CARDIAC:RRR, no murmurs, rubs, gallops RESPIRATORY:  Clear to auscultation without rales, wheezing or rhonchi  ABDOMEN: Soft, non-tender, non-distended MUSCULOSKELETAL:  No edema; No deformity  SKIN: Warm and dry NEUROLOGIC:  Alert and oriented x 3 PSYCHIATRIC:  Normal affect  ASSESSMENT:    No diagnosis found.  PLAN:    In order of problems listed above:  1.  ASCAD -s/p AWMI 2002 s/p PCI of the LAD with last nuclear stress test 04/2016 with anterior scar but no ischemia. -He has not had any anginal symptoms since I saw him last -Continue aspirin 81 mg daily and prescription drug management with Crestor 10 mg daily with as needed refills  2.  HLD -LDL goal < 70 -I have personally reviewed and interpreted outside labs performed by patient's PCP which showed LDL 64 and HDL 35 on 08/02/2022 -Continue prescription drug with Crestor 10 mg daily with as needed refills  3.  HTN -BP is controlled on exam today -Continue drug management with ramipril 2.5 mg daily with as needed refills -I have personally reviewed and interpreted outside labs performed by patient's PCP which showed serum creatinine 0.89 and potassium 4.6 on 08/02/2022   Medication Adjustments/Labs and Tests Ordered: Current medicines are reviewed at length with the patient today.  Concerns regarding medicines are outlined above.  No orders of the defined types were placed in  this encounter.  No orders of the defined types were placed in this encounter.   Signed, Armanda Magic, MD  03/28/2023 8:14 AM    [facility]

## 2023-04-26 ENCOUNTER — Encounter: Payer: Self-pay | Admitting: Physician Assistant

## 2023-04-26 ENCOUNTER — Ambulatory Visit: Payer: Medicare Other | Attending: Cardiology | Admitting: Physician Assistant

## 2023-04-26 VITALS — BP 118/76 | HR 58 | Ht 71.0 in | Wt 193.0 lb

## 2023-04-26 DIAGNOSIS — I10 Essential (primary) hypertension: Secondary | ICD-10-CM | POA: Diagnosis not present

## 2023-04-26 DIAGNOSIS — I2510 Atherosclerotic heart disease of native coronary artery without angina pectoris: Secondary | ICD-10-CM | POA: Diagnosis not present

## 2023-04-26 DIAGNOSIS — E785 Hyperlipidemia, unspecified: Secondary | ICD-10-CM

## 2023-04-26 MED ORDER — ICOSAPENT ETHYL 1 G PO CAPS: 2.0000 g | ORAL_CAPSULE | Freq: Two times a day (BID) | ORAL | 8 refills | Status: DC

## 2023-04-26 NOTE — Patient Instructions (Addendum)
Medication Instructions:  Your physician recommends that you continue on your current medications as directed. Please refer to the Current Medication list given to you today.  *If you need a refill on your cardiac medications before your next appointment, please call your pharmacy*  Lab Work: Fasting lipids and lft's on 05/03/2023, you can come anytime 7:15 AM-4:30 PM. If you have labs (blood work) drawn today and your tests are completely normal, you will receive your results only by: MyChart Message (if you have MyChart) OR A paper copy in the mail If you have any lab test that is abnormal or we need to change your treatment, we will call you to review the results.  Follow-Up: At [facility], you and your health needs are our priority.  As part of our continuing mission to provide you with exceptional heart care, we have created designated Provider Care Teams.  These Care Teams include your primary Cardiologist (physician) and Advanced Practice Providers (APPs -  Physician Assistants and Nurse Practitioners) who all work together to provide you with the care you need, when you need it.  Your next appointment:   1 year(s)  Provider:   Armanda Magic, MD    Other Instructions Low-Sodium Eating Plan Sodium, which is an element that makes up salt, helps you maintain a healthy balance of fluids in your body. Too much sodium can increase your blood pressure and cause fluid and waste to be held in your body. Your health care provider or dietitian may recommend following this plan if you have high blood pressure (hypertension), kidney disease, liver disease, or heart failure. Eating less sodium can help lower your blood pressure, reduce swelling, and protect your heart, liver, and kidneys. What are tips for following this plan? Reading food labels The Nutrition Facts label lists the amount of sodium in one serving of the food. If you eat more than one serving, you must multiply the listed  amount of sodium by the number of servings. Choose foods with less than 140 mg of sodium per serving. Avoid foods with 300 mg of sodium or more per serving. Shopping  Look for lower-sodium products, often labeled as "low-sodium" or "no salt added." Always check the sodium content, even if foods are labeled as "unsalted" or "no salt added." Buy fresh foods. Avoid canned foods and pre-made or frozen meals. Avoid canned, cured, or processed meats. Buy breads that have less than 80 mg of sodium per slice. Cooking  Eat more home-cooked food and less restaurant, buffet, and fast food. Avoid adding salt when cooking. Use salt-free seasonings or herbs instead of table salt or sea salt. Check with your health care provider or pharmacist before using salt substitutes. Cook with plant-based oils, such as canola, sunflower, or olive oil. Meal planning When eating at a restaurant, ask that your food be prepared with less salt or no salt, if possible. Avoid dishes labeled as brined, pickled, cured, smoked, or made with soy sauce, miso, or teriyaki sauce. Avoid foods that contain MSG (monosodium glutamate). MSG is sometimes added to [place] food, bouillon, and some canned foods. Make meals that can be grilled, baked, poached, roasted, or steamed. These are generally made with less sodium. General information Most people on this plan should limit their sodium intake to 1,500-2,000 mg (milligrams) of sodium each day. What foods should I eat? Fruits Fresh, frozen, or canned fruit. Fruit juice. Vegetables Fresh or frozen vegetables. "No salt added" canned vegetables. "No salt added" tomato sauce and paste.  Low-sodium or reduced-sodium tomato and vegetable juice. Grains Low-sodium cereals, including oats, puffed wheat and rice, and shredded wheat. Low-sodium crackers. Unsalted rice. Unsalted pasta. Low-sodium bread. Whole-grain breads and whole-grain pasta. Meats and other proteins Fresh or frozen (no salt  added) meat, poultry, seafood, and fish. Low-sodium canned tuna and salmon. Unsalted nuts. Dried peas, beans, and lentils without added salt. Unsalted canned beans. Eggs. Unsalted nut butters. Dairy Milk. Soy milk. Cheese that is naturally low in sodium, such as ricotta cheese, fresh mozzarella, or Swiss cheese. Low-sodium or reduced-sodium cheese. Cream cheese. Yogurt. Seasonings and condiments Fresh and dried herbs and spices. Salt-free seasonings. Low-sodium mustard and ketchup. Sodium-free salad dressing. Sodium-free light mayonnaise. Fresh or refrigerated horseradish. Lemon juice. Vinegar. Other foods Homemade, reduced-sodium, or low-sodium soups. Unsalted popcorn and pretzels. Low-salt or salt-free chips. The items listed above may not be a complete list of foods and beverages you can eat. Contact a dietitian for more information. What foods should I avoid? Vegetables Sauerkraut, pickled vegetables, and relishes. Olives. [place] fries. Onion rings. Regular canned vegetables (not low-sodium or reduced-sodium). Regular canned tomato sauce and paste (not low-sodium or reduced-sodium). Regular tomato and vegetable juice (not low-sodium or reduced-sodium). Frozen vegetables in sauces. Grains Instant hot cereals. Bread stuffing, pancake, and biscuit mixes. Croutons. Seasoned rice or pasta mixes. Noodle soup cups. Boxed or frozen macaroni and cheese. Regular salted crackers. Self-rising flour. Meats and other proteins Meat or fish that is salted, canned, smoked, spiced, or pickled. Precooked or cured meat, such as sausages or meat loaves. Eugene Moreno. Ham. Pepperoni. Hot dogs. Corned beef. Chipped beef. Salt pork. Jerky. Pickled herring. Anchovies and sardines. Regular canned tuna. Salted nuts. Dairy Processed cheese and cheese spreads. Hard cheeses. Cheese curds. Blue cheese. Feta cheese. String cheese. Regular cottage cheese. Buttermilk. Canned milk. Fats and oils Salted butter. Regular margarine. Ghee.  Bacon fat. Seasonings and condiments Onion salt, garlic salt, seasoned salt, table salt, and sea salt. Canned and packaged gravies. Worcestershire sauce. Tartar sauce. Barbecue sauce. Teriyaki sauce. Soy sauce, including reduced-sodium. Steak sauce. Fish sauce. Oyster sauce. Cocktail sauce. Horseradish that you find on the shelf. Regular ketchup and mustard. Meat flavorings and tenderizers. Bouillon cubes. Hot sauce. Pre-made or packaged marinades. Pre-made or packaged taco seasonings. Relishes. Regular salad dressings. Salsa. Other foods Salted popcorn and pretzels. Corn chips and puffs. Potato and tortilla chips. Canned or dried soups. Pizza. Frozen entrees and pot pies. The items listed above may not be a complete list of foods and beverages you should avoid. Contact a dietitian for more information. Summary Eating less sodium can help lower your blood pressure, reduce swelling, and protect your heart, liver, and kidneys. Most people on this plan should limit their sodium intake to 1,500-2,000 mg (milligrams) of sodium each day. Canned, boxed, and frozen foods are high in sodium. Restaurant foods, fast foods, and pizza are also very high in sodium. You also get sodium by adding salt to food. Try to cook at home, eat more fresh fruits and vegetables, and eat less fast food and canned, processed, or prepared foods. This information is not intended to replace advice given to you by your health care provider. Make sure you discuss any questions you have with your health care provider. Document Revised: 11/12/2019 Document Reviewed: 11/07/2019 Elsevier Patient Education  2023 Else[employer] Inc.   Heart-Healthy Eating Plan Many factors influence your heart health, including eating and exercise habits. Heart health is also called coronary health. Coronary risk increases with abnormal blood fat (lipid) levels.  A heart-healthy eating plan includes limiting unhealthy fats, increasing healthy fats, limiting salt  (sodium) intake, and making other diet and lifestyle changes. What is my plan? Your health care provider may recommend that: You limit your fat intake to _________% or less of your total calories each day. You limit your saturated fat intake to _________% or less of your total calories each day. You limit the amount of cholesterol in your diet to less than _________ mg per day. You limit the amount of sodium in your diet to less than _________ mg per day. What are tips for following this plan? Cooking Cook foods using methods other than frying. Baking, boiling, grilling, and broiling are all good options. Other ways to reduce fat include: Removing the skin from poultry. Removing all visible fats from meats. Steaming vegetables in water or broth. Meal planning  At meals, imagine dividing your plate into fourths: Fill one-half of your plate with vegetables and green salads. Fill one-fourth of your plate with whole grains. Fill one-fourth of your plate with lean protein foods. Eat 2-4 cups of vegetables per day. One cup of vegetables equals 1 cup (91 g) broccoli or cauliflower florets, 2 medium carrots, 1 large bell pepper, 1 large sweet potato, 1 large tomato, 1 medium white potato, 2 cups (150 g) raw leafy greens. Eat 1-2 cups of fruit per day. One cup of fruit equals 1 small apple, 1 large banana, 1 cup (237 g) mixed fruit, 1 large orange,  cup (82 g) dried fruit, 1 cup (240 mL) 100% fruit juice. Eat more foods that contain soluble fiber. Examples include apples, broccoli, carrots, beans, peas, and barley. Aim to get 25-30 g of fiber per day. Increase your consumption of legumes, nuts, and seeds to 4-5 servings per week. One serving of dried beans or legumes equals  cup (90 g) cooked, 1 serving of nuts is  oz (12 almonds, 24 pistachios, or 7 walnut halves), and 1 serving of seeds equals  oz (8 g). Fats Choose healthy fats more often. Choose monounsaturated and polyunsaturated fats,  such as olive and canola oils, avocado oil, flaxseeds, walnuts, almonds, and seeds. Eat more omega-3 fats. Choose salmon, mackerel, sardines, tuna, flaxseed oil, and ground flaxseeds. Aim to eat fish at least 2 times each week. Check food labels carefully to identify foods with trans fats or high amounts of saturated fat. Limit saturated fats. These are found in animal products, such as meats, butter, and cream. Plant sources of saturated fats include palm oil, palm kernel oil, and coconut oil. Avoid foods with partially hydrogenated oils in them. These contain trans fats. Examples are stick margarine, some tub margarines, cookies, crackers, and other baked goods. Avoid fried foods. General information Eat more home-cooked food and less restaurant, buffet, and fast food. Limit or avoid alcohol. Limit foods that are high in added sugar and simple starches such as foods made using white refined flour (white breads, pastries, sweets). Lose weight if you are overweight. Losing just 5-10% of your body weight can help your overall health and prevent diseases such as diabetes and heart disease. Monitor your sodium intake, especially if you have high blood pressure. Talk with your health care provider about your sodium intake. Try to incorporate more vegetarian meals weekly. What foods should I eat? Fruits All fresh, canned (in natural juice), or frozen fruits. Vegetables Fresh or frozen vegetables (raw, steamed, roasted, or grilled). Green salads. Grains Most grains. Choose whole wheat and whole grains most of the time.  Rice and pasta, including brown rice and pastas made with whole wheat. Meats and other proteins Lean, well-trimmed beef, veal, pork, and lamb. Chicken and [place] without skin. All fish and shellfish. Wild duck, rabbit, pheasant, and venison. Egg whites or low-cholesterol egg substitutes. Dried beans, peas, lentils, and tofu. Seeds and most nuts. Dairy Low-fat or nonfat cheeses,  including ricotta and mozzarella. Skim or 1% milk (liquid, powdered, or evaporated). Buttermilk made with low-fat milk. Nonfat or low-fat yogurt. Fats and oils Non-hydrogenated (trans-free) margarines. Vegetable oils, including soybean, sesame, sunflower, olive, avocado, peanut, safflower, corn, canola, and cottonseed. Salad dressings or mayonnaise made with a vegetable oil. Beverages Water (mineral or sparkling). Coffee and tea. Unsweetened ice tea. Diet beverages. Sweets and desserts Sherbet, gelatin, and fruit ice. Small amounts of dark chocolate. Limit all sweets and desserts. Seasonings and condiments All seasonings and condiments. The items listed above may not be a complete list of foods and beverages you can eat. Contact a dietitian for more options. What foods should I avoid? Fruits Canned fruit in heavy syrup. Fruit in cream or butter sauce. Fried fruit. Limit coconut. Vegetables Vegetables cooked in cheese, cream, or butter sauce. Fried vegetables. Grains Breads made with saturated or trans fats, oils, or whole milk. Croissants. Sweet rolls. Donuts. High-fat crackers, such as cheese crackers and chips. Meats and other proteins Fatty meats, such as hot dogs, ribs, sausage, bacon, rib-eye roast or steak. High-fat deli meats, such as salami and bologna. Caviar. Domestic duck and goose. Organ meats, such as liver. Dairy Cream, sour cream, cream cheese, and creamed cottage cheese. Whole-milk cheeses. Whole or 2% milk (liquid, evaporated, or condensed). Whole buttermilk. Cream sauce or high-fat cheese sauce. Whole-milk yogurt. Fats and oils Meat fat, or shortening. Cocoa butter, hydrogenated oils, palm oil, coconut oil, palm kernel oil. Solid fats and shortenings, including bacon fat, salt pork, lard, and butter. Nondairy cream substitutes. Salad dressings with cheese or sour cream. Beverages Regular sodas and any drinks with added sugar. Sweets and desserts Frosting. Pudding.  Cookies. Cakes. Pies. Milk chocolate or white chocolate. Buttered syrups. Full-fat ice cream or ice cream drinks. The items listed above may not be a complete list of foods and beverages to avoid. Contact a dietitian for more information. Summary Heart-healthy meal planning includes limiting unhealthy fats, increasing healthy fats, limiting salt (sodium) intake and making other diet and lifestyle changes. Lose weight if you are overweight. Losing just 5-10% of your body weight can help your overall health and prevent diseases such as diabetes and heart disease. Focus on eating a balance of foods, including fruits and vegetables, low-fat or nonfat dairy, lean protein, nuts and legumes, whole grains, and heart-healthy oils and fats. This information is not intended to replace advice given to you by your health care provider. Make sure you discuss any questions you have with your health care provider. Document Revised: 01/11/2022 Document Reviewed: 01/11/2022 Elsevier Patient Education  2023 [employer].

## 2023-04-26 NOTE — Progress Notes (Signed)
Office Visit    Patient Name: Eugene Moreno Date of Encounter: 04/26/2023  PCP:  Gweneth Dimitri, MD   [facility]  Cardiologist:  Armanda Magic, MD  Advanced Practice Provider:  No care team member to display Electrophysiologist:  None   HPI    Eugene Moreno is a 77 y.o. male with a past medical history of AAS CAD status post AWMI 2002 status post PCI of the LAD with last nuclear stress test 5/17 with anterior scar but no ischemia presents today for follow-up appointment.  He was last seen by Dr. Mayford Knife 02/24/2022 for follow-up.  no symptoms.  Tolerating medications.  Today, he is doing well with no new symptoms.  No chest pain or shortness of breath.  He remains active by walking about 4 miles a day on his treadmill.  He has not had an updated lipid panel/LFTs in a while.  We will order this today.  EKG reviewed with the patient today.  He is having some issues getting his Vascepa covered so we have ordered the generic today which is more cost effective for him.  Otherwise, doing well.  Reports no shortness of breath nor dyspnea on exertion. Reports no chest pain, pressure, or tightness. No edema, orthopnea, PND. Reports no palpitations.   Past Medical History    Past Medical History:  Diagnosis Date   Coronary artery disease    cardiologist--- dr t. 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    urologist-- dr Marlou Porch--- dx 05/ 2022, Gleason 4+3, PSA 8.04   S/P drug eluting coronary stent placement 09/06/2001   x1 to prox LAD   Wears glasses     Wears hearing aid in both ears    Past Surgical History:  Procedure Laterality Date   ANTERIOR CERVICAL DECOMP/DISCECTOMY FUSION  01/26/2002   @[facility]  by dr Jeral Fruit;   C6-C7   COLONOSCOPY  2020   CORONARY ANGIOPLASTY WITH STENT PLACEMENT  09/06/2001   @[facility]  by dr s. 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  There was no ST segment deviation noted during stress.  Defect 1: There is a medium defect of moderate severity present in the basal anteroseptal, mid  anteroseptal, apical anterior, apical septal and apex location.  Findings consistent with prior myocardial infarction.  This is an intermediate risk study.  The left ventricular ejection fraction is mildly decreased (45-54%).  scar in the anteroseptal, anterior and apical distributions.  Compared to report from 2010 calculated LVEF is less  Excellent exercise tolerance               EKG:  EKG is  ordered today.  The ekg ordered today demonstrates sinus bradycardia, rate 58 bpm, incomplete right bundle branch block which is consistent with last year's EKG  Recent Labs: No results found for requested labs within last 365 days.  Recent  Lipid Panel    Component Value Date/Time   CHOL 124 08/02/2022 0748   TRIG 139 08/02/2022 0748   HDL 35 (L) 08/02/2022 0748   CHOLHDL 3.5 08/02/2022 0748   CHOLHDL 3.2 04/26/2016 0942   VLDL 23 04/26/2016 0942   LDLCALC 64 08/02/2022 0748     Home Medications   Current Meds  Medication Sig   aspirin EC 81 MG tablet Take 2 tablets (162 mg total) by mouth daily.   Cetirizine HCl (ZYRTEC ALLERGY) 10 MG CAPS Take 1 capsule by mouth daily.   Cod Liver Oil CAPS Take 2 capsules by mouth daily.   ferrous fumarate (HEMOCYTE - 106 MG FE) 325 (106 FE) MG TABS Take 1 tablet by mouth daily.   folic acid (FOLVITE) 800 MCG tablet Take 800 mcg by mouth daily.    icosapent Ethyl (VASCEPA) 1 g capsule Take 2 capsules (2 g total) by mouth 2 (two) times daily.   ramipril (ALTACE) 2.5 MG capsule TAKE 1 CAPSULE BY MOUTH EVERY DAY   rosuvastatin (CRESTOR) 10 MG tablet TAKE 1 TABLET BY MOUTH EVERY DAY   vitamin C (ASCORBIC ACID) 500 MG tablet Take 1,000 mg by mouth daily.    vitamin E 400 UNIT capsule Take 400 Units by mouth daily.   [DISCONTINUED] VASCEPA 1 g capsule Take 2 capsules (2 g total) by mouth 2 (two) times daily.     Review of Systems      All other systems reviewed and are otherwise negative except as noted above.  Physical Exam    VS:  BP 118/76 (BP Location: Left Arm, Patient Position: Sitting, Cuff Size: Normal)   Pulse (!) 58   Ht 5\' 11"  (1.803 m)   Wt 193 lb (87.5 kg)   BMI 26.92 kg/m  , BMI Body mass index is 26.92 kg/m.  Wt Readings from Last 3 Encounters:  04/26/23 193 lb (87.5 kg)  02/24/22 203 lb 6.4 oz (92.3 kg)  10/02/21 203 lb 6.4 oz (92.3 kg)     GEN: Well nourished, well developed, in no acute distress. HEENT: normal. Neck: Supple, no JVD, carotid bruits, or masses. Cardiac: RRR, no murmurs, rubs, or gallops. No clubbing, cyanosis, edema.  Radials/PT 2+ and equal bilaterally.  Respiratory:  Respirations regular and unlabored, clear to auscultation  bilaterally. GI: Soft, nontender, nondistended. MS: No deformity or atrophy. Skin: Warm and dry, no rash. Neuro:  Strength and sensation are intact. Psych: Normal affect.  Assessment & Plan    CAD status post PCI of the LAD 2002 -no chest pains or SOB -Continue with GDMT: Aspirin 81 mg daily, cod liver oil 2 capsules daily, Vascepa 2 g twice a day, ramipril 2.5 mg daily, Crestor 10 mg daily -continue daily physical activity  HLD -Continue current medication regimen -Will update a lipid panel  HTN -  Bp well controlled  -Continue current medications -Continue to track blood pressure at home       Disposition: Follow up 1 year with Armanda Magic, MD or APP.  Signed, Sharlene Dory, PA-C 04/26/2023, 3:50 PM [facility]

## 2023-05-03 ENCOUNTER — Ambulatory Visit: Payer: Medicare Other | Attending: Cardiology

## 2023-05-03 DIAGNOSIS — E785 Hyperlipidemia, unspecified: Secondary | ICD-10-CM

## 2023-05-03 DIAGNOSIS — I10 Essential (primary) hypertension: Secondary | ICD-10-CM

## 2023-05-03 DIAGNOSIS — I2510 Atherosclerotic heart disease of native coronary artery without angina pectoris: Secondary | ICD-10-CM

## 2023-05-04 LAB — HEPATIC FUNCTION PANEL
ALT: 37 IU/L (ref 0–44)
AST: 28 IU/L (ref 0–40)
Albumin: 4.6 g/dL (ref 3.8–4.8)
Alkaline Phosphatase: 90 IU/L (ref 44–121)
Bilirubin Total: 0.7 mg/dL (ref 0.0–1.2)
Bilirubin, Direct: 0.17 mg/dL (ref 0.00–0.40)
Total Protein: 7.7 g/dL (ref 6.0–8.5)

## 2023-05-04 LAB — LIPID PANEL
Chol/HDL Ratio: 3.2 ratio (ref 0.0–5.0)
Cholesterol, Total: 133 mg/dL (ref 100–199)
HDL: 42 mg/dL (ref >39)
LDL Chol Calc (NIH): 69 mg/dL (ref 0–99)
Triglycerides: 120 mg/dL (ref 0–149)
VLDL Cholesterol Cal: 22 mg/dL (ref 5–40)

## 2023-05-07 ENCOUNTER — Other Ambulatory Visit: Payer: Self-pay | Admitting: Cardiology

## 2023-06-13 ENCOUNTER — Ambulatory Visit (HOSPITAL_COMMUNITY)
Admission: RE | Admit: 2023-06-13 | Discharge: 2023-06-13 | Disposition: A | Discharge status: Home / Self Care | Payer: Medicare Other | Source: Ambulatory Visit | Attending: Cardiology | Admitting: Cardiology

## 2023-06-13 ENCOUNTER — Ambulatory Visit (HOSPITAL_BASED_OUTPATIENT_CLINIC_OR_DEPARTMENT_OTHER)
Admission: RE | Admit: 2023-06-13 | Discharge: 2023-06-13 | Disposition: A | Discharge status: Home / Self Care | Payer: Medicare Other | Source: Ambulatory Visit | Attending: Vascular Surgery | Admitting: Vascular Surgery

## 2023-06-13 ENCOUNTER — Encounter (HOSPITAL_COMMUNITY): Payer: Self-pay | Admitting: Vascular Surgery

## 2023-06-13 ENCOUNTER — Other Ambulatory Visit (HOSPITAL_COMMUNITY): Payer: Self-pay | Admitting: Family Medicine

## 2023-06-13 ENCOUNTER — Other Ambulatory Visit (HOSPITAL_COMMUNITY): Payer: Self-pay

## 2023-06-13 VITALS — BP 105/70 | HR 64

## 2023-06-13 DIAGNOSIS — M79661 Pain in right lower leg: Secondary | ICD-10-CM | POA: Insufficient documentation

## 2023-06-13 DIAGNOSIS — I824Y1 Acute embolism and thrombosis of unspecified deep veins of right proximal lower extremity: Secondary | ICD-10-CM | POA: Diagnosis present

## 2023-06-13 DIAGNOSIS — I82421 Acute embolism and thrombosis of right iliac vein: Secondary | ICD-10-CM | POA: Insufficient documentation

## 2023-06-13 DIAGNOSIS — M7989 Other specified soft tissue disorders: Secondary | ICD-10-CM

## 2023-06-13 MED ORDER — APIXABAN (ELIQUIS) VTE STARTER PACK (10MG AND 5MG)
ORAL_TABLET | ORAL | 0 refills | Status: DC
  Filled 2023-06-13: qty 74, 30d supply, fill #0

## 2023-06-13 MED ORDER — APIXABAN 5 MG PO TABS: 5.0000 mg | ORAL_TABLET | Freq: Two times a day (BID) | ORAL | 5 refills | Status: DC

## 2023-06-13 NOTE — Progress Notes (Cosign Needed)
DVT Clinic Note  Name: Eugene Moreno     MRN: [id number]     DOB: [birth year]     Sex: male  PCP: Orpha Bur, MD  Today's Visit: Visit Information: Initial Visit  Referred to DVT Clinic by: Dr. Rubye Oaks (PCP at [facility]) Referred to CPP by: Dr. Karin Lieu Reason for referral:  Chief Complaint  Patient presents with   DVT   HISTORY OF PRESENT ILLNESS: Eugene Moreno is a 77 y.o. male with PMH CAD, HLD, prostate cancer,who presents after diagnosis of DVT for medication management. Patient was seen by his PCP Dr. Rubye Oaks today for worsening swelling and redness in his right leg which began about 3 weeks ago. He denies pain and is able to walk without complaint. His PCP ordered a doppler which showed acute occlusive DVT extending into his external iliac and common femoral veins. A 11.8 x 9.6 x 12.6 cm lesion was also found anterior to the prostate which appears to be causing extrinsic compression on the external iliac vein. Patient has a history of prostate cancer s/p radioactive seed implant 08/2021 and is now just monitored periodically. He denies any recent injury, travel, or immobility.   Positive Thrombotic Risk Factors: Active cancer, Older Age Bleeding Risk Factors: Age >65 years, Antiplatelet therapy, Cancer  Negative Thrombotic Risk Factors: Previous VTE, Recent surgery (within 3 months), Recent trauma (within 3 months), Recent admission to hospital with acute illness (within 3 months), Paralysis, paresis, or recent plaster cast immobilization of lower extremity, Testosterone therapy, Pregnancy, Central venous catheterization, Bed rest >72 hours within 3 months, Recent COVID diagnosis (within 3 months), Erythropoiesis-stimulating agent, Within 6 weeks postpartum, Recent cesarean section (within 3 months), Estrogen therapy, Non-malignant, chronic inflammatory condition, Smoking, Obesity, Known thrombophilic condition, Sedentary journey lasting >8 hours within 4 weeks  Rx Insurance Coverage:  Medicare Rx Affordability: Eliquis is $26/month. Filled today for $0 using one time free savings card.  Preferred Pharmacy: Filled during visit today at [facility]. [facility]   Past Medical History:  Diagnosis Date   Coronary artery disease    cardiologist--- dr t. 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  SPACE OAR INSTILLATION N/A 09/03/2021   Procedure: SPACE OAR INSTILLATION;  Surgeon: Crist Fat, MD;  Location: [facility];  Service: Urology;  Laterality: N/A;   WISDOM TOOTH EXTRACTION      Social History   Socioeconomic History   Marital status: Married    Spouse name: Not on file   Number of children: Not on file   Years of education: Not on file    Highest education level: Not on file  Occupational History   Occupation: retired    Associate Professor: [employer]    Comment: Systems analyst  Tobacco Use   Smoking status: Former    Years: 4    Types: Cigarettes    Quit date: 1969    Years since quitting: 55.5   Smokeless tobacco: Never  Vaping Use   Vaping Use: Never used  Substance and Sexual Activity   Alcohol use: Yes    Comment: occasion   Drug use: Never   Sexual activity: Yes    Comment: vasectomy  Other Topics Concern   Not on file  Social History Narrative   Not on file   Social Determinants of Health   Financial Resource Strain: Not on file  Food Insecurity: Not on file  Transportation Needs: Not on file  Physical Activity: Not on file  Stress: Not on file  Social Connections: Not on file  Intimate Partner Violence: Not on file    Family History  Problem Relation Age of Onset   Breast cancer Mother 49   Heart attack Father        x3 between ages 54-64   Other Sister 29       angioplasty    Allergies as of 06/13/2023 - Review Complete 06/13/2023  Allergen Reaction Noted   Niacin and related  09/20/2017    Current Outpatient Medications on File Prior to Encounter  Medication Sig Dispense Refill   aspirin EC 81 MG tablet Take 2 tablets (162 mg total) by mouth daily.     Cetirizine HCl (ZYRTEC ALLERGY) 10 MG CAPS Take 1 capsule by mouth daily.     Cod Liver Oil CAPS Take 2 capsules by mouth daily.     ferrous fumarate (HEMOCYTE - 106 MG FE) 325 (106 FE) MG TABS Take 1 tablet by mouth daily.     fluticasone (FLONASE) 50 MCG/ACT nasal spray Place 1 spray into both nostrils 2 (two) times daily.     folic acid (FOLVITE) 800 MCG tablet Take 800 mcg by mouth daily.      icosapent Ethyl (VASCEPA) 1 g capsule Take 2 capsules (2 g total) by mouth 2 (two) times daily. 120 capsule 8   ramipril (ALTACE) 2.5 MG capsule TAKE 1 CAPSULE BY MOUTH EVERY DAY 90 capsule 0   rosuvastatin (CRESTOR) 10 MG tablet TAKE 1  TABLET BY MOUTH EVERY DAY 90 tablet 3   vitamin C (ASCORBIC ACID) 500 MG tablet Take 1,000 mg by mouth daily.      vitamin E 400 UNIT capsule Take 400 Units by mouth daily.     No current facility-administered medications on file prior to encounter.   REVIEW OF SYSTEMS:  Review of Systems  Respiratory:  Negative for shortness of breath.   Cardiovascular:  Positive for leg swelling. Negative for chest pain and palpitations.  Musculoskeletal:  Negative for myalgias.  Neurological:  Negative for dizziness and tingling.   PHYSICAL EXAMINATION:  Vitals:   06/13/23 1640  BP: 105/70  Pulse: 64  SpO2: 97%   Physical Exam Vitals reviewed.  Cardiovascular:     Rate and Rhythm: Normal rate.  Pulmonary:     Effort: Pulmonary effort is normal.  Musculoskeletal:     Right lower leg: Edema present.     Left lower leg: No edema.  Skin:    Findings: Erythema present. No bruising.  Psychiatric:        Mood and Affect: Mood normal.        Behavior: Behavior normal.        Thought Content: Thought content normal.   Villalta Score for Post-Thrombotic Syndrome: Pain: Absent Cramps: Absent Heaviness: Mild Paresthesia: Absent Pruritus: Absent Pretibial Edema: Moderate Skin Induration: Absent Hyperpigmentation: Absent Redness: Mild Venous Ectasia: Absent Pain on calf compression: Mild Villalta Preliminary Score: 5 Is venous ulcer present?: No If venous ulcer is present and score is <15, then 15 points total are assigned: Absent Villalta Total Score: 5  LABS:  CBC     Component Value Date/Time   WBC 3.7 (L) 08/31/2021 1028   RBC 4.76 08/31/2021 1028   HGB 15.6 08/31/2021 1028   HGB 14.9 11/05/2016 0940   HCT 44.6 08/31/2021 1028   HCT 43.4 11/05/2016 0940   PLT 175 08/31/2021 1028   PLT 149 11/05/2016 0940   MCV 93.7 08/31/2021 1028   MCV 91.8 11/05/2016 0940   MCH 32.8 08/31/2021 1028   MCHC 35.0 08/31/2021 1028   RDW 12.4 08/31/2021 1028   RDW 12.6 11/05/2016 0940    LYMPHSABS 0.7 (L) 11/05/2016 0940   MONOABS 0.6 11/05/2016 0940   EOSABS 0.1 11/05/2016 0940   EOSABS 0.1 09/03/2010 1109   BASOSABS 0.0 11/05/2016 0940    Hepatic Function      Component Value Date/Time   PROT 7.7 05/03/2023 0830   PROT 8.1 11/05/2016 0940   ALBUMIN 4.6 05/03/2023 0830   ALBUMIN 4.1 11/05/2016 0940   AST 28 05/03/2023 0830   AST 27 11/05/2016 0940   ALT 37 05/03/2023 0830   ALT 33 11/05/2016 0940   ALKPHOS 90 05/03/2023 0830   ALKPHOS 80 11/05/2016 0940   BILITOT 0.7 05/03/2023 0830   BILITOT 0.70 11/05/2016 0940   BILIDIR 0.17 05/03/2023 0830    Renal Function   Lab Results  Component Value Date   CREATININE 0.83 08/31/2021   CREATININE 1.06 10/08/2020   CREATININE 0.92 11/29/2019    CrCl cannot be calculated (Patient's most recent lab result is older than the maximum 21 days allowed.).   No concerns on labs in [facility] from 05/25/23:  -Scr 0.76, eGFR 93, Hgb 14.2, ALT 30, AST 25  [facility] Vascular Lab Studies:  06/13/23  Summary:  RIGHT:  - Findings consistent with acute deep vein thrombosis involving the right  common femoral vein, SF junction, right femoral vein, right proximal  profunda vein, and external iliac vein.  - No cystic structure found in the popliteal fossa.    LEFT:  - No evidence of common femoral vein obstruction.   Right Technical Findings:  Acute occlusive thrombus in the mid and distal external iliac vein CFV and  SFJ. Retrograde flow in the GSV at proximal thigh level. Acute  non-occlusive thrombus in the proximal femoral vein and deep femoral vein.  Rouleau flow noted in the popliteal vein.   There is an inhomogeneous lesion with minimum vascularity anterior to the  prostate, measuring 11.8 x 9.6 x 12.6 cm, with a volume of 749.3 cubic  centimeters. Prior MR on 04/11/2021 reports 1.7 x .  07 x 1.1 cm category 4  lesion. This lesion appears to be causing extrinsic compression on the external iliac vein (proximal external iliac vein not  visualized due to extrinsic compression).   ASSESSMENT: Location of DVT: Right iliac vein, Right common femoral vein, Right femoral vein. Need to determine what the lesion seen on ultrasound is to help determine cause of the DVT and next steps. Patient does have a history of prostate cancer which is s/p seed implant 08/2021.   Given involvement of the common femoral and external iliac veins, I consulted with Dr. Karin Lieu who came to evaluate the patient - please see his additional notes. He plans to follow up with the patient in his office this week. We started Eliquis in the office today and provided him with refills. Continue aspirin per Dr. Karin Lieu.   PLAN: -Start apixaban (Eliquis) 10 mg twice daily for 7 days followed by 5 mg twice daily. -Expected duration of therapy: Pending further work up. Therapy started on 06/13/23. -Patient educated on purpose, proper use and potential adverse effects of apixaban (Eliquis). -Discussed importance of taking medication around the same time every day. -Advised patient of medications to avoid (NSAIDs, aspirin doses >100 mg daily). -Educated that Tylenol (acetaminophen) is the preferred analgesic to lower the risk of bleeding. -Advised patient to alert all providers of anticoagulation therapy prior to starting a new medication or having a procedure. -Emphasized importance of monitoring for signs and symptoms of bleeding (abnormal bruising, prolonged bleeding, nose bleeds, bleeding from gums, discolored urine, black tarry stools). -Educated patient to present to the ED if emergent signs and symptoms of new thrombosis occur. -Counseled patient to wear compression stockings daily, removing at night. Encouraged elevation as well to help improve swelling.   Follow up: with Dr. Lesle Chris, PharmD, BCACP, CPP Deep Vein Thrombosis Clinic Clinical Pharmacist Practitioner Office: [phone]  VASCULAR STAFF ADDENDUM: I have independently interviewed and  examined the patient. I agree with the above.  In short, Eugene Moreno is a 77 year old male with previous history of prostate cancer, and 3-week history of right lower extremity swelling.  DVT ultrasound demonstrated diffuse thrombosis extending from the external iliac vein into the tibial veins.  Fortunately, Eugene Moreno is relatively asymptomatic.  He notes swelling, but is able to perform all activities of daily living.  Ultrasound also demonstrated some concern that there is an intra-abdominal mass which may be compressing the external iliac vein, which may have led to this compression.  In an effort to elucidate the etiology of his thrombosis, I will order a CT scan, and plan to see him later this week.  No plan for immediate percutaneous mechanical thrombectomy, however with the extent of his DVT, we will be discussing this at his next visit.  Specifically, we will discuss the risks and benefits of medical management versus intervention.  If he remains asymptomatic, I will be less inclined to offer intervention.  Fara Olden, MD [facility] Phone Number: [phone] 06/14/2023 9:18 AM

## 2023-06-13 NOTE — Patient Instructions (Signed)
-  Start apixaban (Eliquis) 10 mg twice daily for 7 days followed by 5 mg twice daily. -[facility]. You may need to call the pharmacy to ask them to fill this when you start to run low on your current supply.  -Dr. Karin Lieu' office will be giving you a call to set up a visit with him. [phone] -It is important to take your medication around the same time every day.  -Avoid NSAIDs like ibuprofen (Advil, Motrin) and naproxen (Aleve) as well as aspirin doses over 100 mg daily. -Tylenol (acetaminophen) is the preferred over the counter pain medication to lower the risk of bleeding. -Be sure to alert all of your health care providers that you are taking an anticoagulant prior to starting a new medication or having a procedure. -Monitor for signs and symptoms of bleeding (abnormal bruising, prolonged bleeding, nose bleeds, bleeding from gums, discolored urine, black tarry stools). If you have fallen and hit your head OR if your bleeding is severe or not stopping, seek emergency care.  -Go to the emergency room if emergent signs and symptoms of new clot occur (new or worse swelling and pain in an arm or leg, shortness of breath, chest pain, fast or irregular heartbeats, lightheadedness, dizziness, fainting, coughing up blood) or if you experience a significant color change (pale or blue) in the extremity that has the DVT.  -We recommend you wear compression stockings (20-30 mmHg) as long as you are having swelling or pain. Be sure to purchase the correct size and take them off at night.   If you have any questions or need to reschedule an appointment, please call [phone] [facility].  If you are having an emergency, call 911 or present to the nearest emergency room.   What is a DVT?  -Deep vein thrombosis (DVT) is a condition in which a blood clot forms in a vein of the deep venous system which can occur in the lower leg, thigh, pelvis, arm, or neck. This condition is serious  and can be life-threatening if the clot travels to the arteries of the lungs and causing a blockage (pulmonary embolism, PE). A DVT can also damage veins in the leg, which can lead to long-term venous disease, leg pain, swelling, discoloration, and ulcers or sores (post-thrombotic syndrome).  -Treatment may include taking an anticoagulant medication to prevent more clots from forming and the current clot from growing, wearing compression stockings, and/or surgical procedures to remove or dissolve the clot.

## 2023-06-14 ENCOUNTER — Telehealth: Payer: Self-pay | Admitting: Cardiology

## 2023-06-14 NOTE — Telephone Encounter (Signed)
Pt c/o medication issue:  1. Name of Medication:   icosapent Ethyl (VASCEPA) 1 g capsule    2. How are you currently taking this medication (dosage and times per day)?  Take 2 capsules (2 g total) by mouth 2 (two) times daily.       3. Are you having a reaction (difficulty breathing--STAT)? No  4. What is your medication issue? Pt stated he'd like a callback regarding insurance not covering this medication due to it being other less expensive medication options. Please advise

## 2023-06-14 NOTE — Telephone Encounter (Signed)
Spoke to the pt, he was advised by his health insurance company they will no longer cover Vascepa. He did contact they pharmacy, they resubmitted the medication under the brand name and it was approved.

## 2023-06-15 ENCOUNTER — Other Ambulatory Visit: Payer: Self-pay

## 2023-06-15 DIAGNOSIS — I82421 Acute embolism and thrombosis of right iliac vein: Secondary | ICD-10-CM

## 2023-06-16 ENCOUNTER — Ambulatory Visit (HOSPITAL_COMMUNITY)
Admission: RE | Admit: 2023-06-16 | Discharge: 2023-06-16 | Disposition: A | Discharge status: Home / Self Care | Payer: Medicare Other | Source: Ambulatory Visit | Attending: Vascular Surgery | Admitting: Vascular Surgery

## 2023-06-16 DIAGNOSIS — I82421 Acute embolism and thrombosis of right iliac vein: Secondary | ICD-10-CM | POA: Diagnosis present

## 2023-06-16 MED ORDER — IOHEXOL 350 MG/ML SOLN
100.0000 mL | Freq: Once | INTRAVENOUS | Status: AC | PRN
  Administered 2023-06-16: 100 mL via INTRAVENOUS

## 2023-06-16 MED ORDER — SODIUM CHLORIDE (PF) 0.9 % IJ SOLN
INTRAMUSCULAR | Status: AC
  Filled 2023-06-16: qty 50

## 2023-06-16 NOTE — Progress Notes (Signed)
Office Note     CC: Right lower extremity deep venous thrombosis Requesting Provider:  Orpha Bur, MD  HPI: Eugene Moreno is a 77 y.o. (1946/07/23) male presenting in follow-up after being seen at DVT clinic.  At that time, imaging demonstrated diffuse right lower extremity DVT extending from the iliac vessels into the tibial vessels.  Fortunately, Eugene Moreno was relatively asymptomatic.  Imaging also demonstrated a mass in the right pelvis.  He presents today to discuss findings of recent CT scan  On exam, Eugene Moreno was doing well, accompanied by his wife.  He had read the results in the chart.  He continues to have swelling in the right lower extremity, otherwise asymptomatic.  Denies issues with urination or defecation.  Normal diet.  Normal energy level.   Past Medical History:  Diagnosis Date   Coronary artery disease    cardiologist--- dr t. 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   SPACE OAR INSTILLATION N/A 09/03/2021   Procedure: SPACE OAR INSTILLATION;  Surgeon: Crist Fat, MD;  Location: [facility];  Service: Urology;  Laterality: N/A;   WISDOM TOOTH EXTRACTION      Social History   Socioeconomic History   Marital status: Married    Spouse name: Not on file   Number of children: Not on file   Years of education: Not on file   Highest education level: Not on file  Occupational History   Occupation: retired    Associate Professor: [employer]    Comment: Systems analyst  Tobacco Use   Smoking status: Former    Years: 4    Types: Cigarettes    Quit date: 1969    Years since quitting: 55.5   Smokeless tobacco: Never  Vaping Use   Vaping Use: Never used  Substance and Sexual Activity   Alcohol use: Yes    Comment: occasion   Drug use: Never   Sexual activity: Yes    Comment: vasectomy  Other Topics Concern   Not on file  Social History Narrative   Not on file   Social Determinants of Health   Financial Resource Strain: Not on file  Food Insecurity: Not on  file  Transportation Needs: Not on file  Physical Activity: Not on file  Stress: Not on file  Social Connections: Not on file  Intimate Partner Violence: Not on file    Family History  Problem Relation Age of Onset   Breast cancer Mother 27   Heart attack Father        x3 between ages 26-64   Other Sister 79       angioplasty    Current Outpatient Medications  Medication Sig Dispense Refill   apixaban (ELIQUIS) 5 MG TABS tablet Take 1 tablet (5 mg total) by mouth 2 (two) times daily. Start taking after completion of starter pack. 60 tablet 5   APIXABAN (ELIQUIS) VTE STARTER PACK (10MG  AND 5MG ) Take as directed on package: start with two-5mg  tablets twice daily for 7 days. On day 8, switch to one-5mg  tablet twice  daily. 74 each 0   aspirin EC 81 MG tablet Take 2 tablets (162 mg total) by mouth daily.     Cetirizine HCl (ZYRTEC ALLERGY) 10 MG CAPS Take 1 capsule by mouth daily.     Cod Liver Oil CAPS Take 2 capsules by mouth daily.     ferrous fumarate (HEMOCYTE - 106 MG FE) 325 (106 FE) MG TABS Take 1 tablet by mouth daily.     fluticasone (FLONASE) 50 MCG/ACT nasal spray Place 1 spray into both nostrils 2 (two) times daily.     folic acid (FOLVITE) 800 MCG tablet Take 800 mcg by mouth daily.      icosapent Ethyl (VASCEPA) 1 g capsule Take 2 capsules (2 g total) by mouth 2 (two) times daily. 120 capsule 8   ramipril (ALTACE) 2.5 MG capsule TAKE 1 CAPSULE BY MOUTH EVERY DAY 90 capsule 0   rosuvastatin (CRESTOR) 10 MG tablet TAKE 1 TABLET BY MOUTH EVERY DAY 90 tablet 3   vitamin C (ASCORBIC ACID) 500 MG tablet Take 1,000 mg by mouth daily.      vitamin E 400 UNIT capsule Take 400 Units by mouth daily.     No current facility-administered medications for this visit.    Allergies  Allergen Reactions   Niacin And Related     Flushing      REVIEW OF SYSTEMS:  [X]  denotes positive finding, [ ]  denotes negative finding Cardiac  Comments:  Chest pain or chest pressure:    Shortness of breath upon exertion:    Short of breath when lying flat:    Irregular heart rhythm:        Vascular    Pain in calf, thigh, or hip brought on by ambulation:    Pain in feet at night that wakes you up from your sleep:     Blood clot in your veins:    Leg swelling:         Pulmonary    Oxygen at home:    Productive cough:     Wheezing:         Neurologic    Sudden weakness in arms or legs:     Sudden numbness in arms or legs:     Sudden onset of difficulty speaking or slurred speech:    Temporary loss of vision in one eye:     Problems with dizziness:         Gastrointestinal    Blood in stool:     Vomited blood:         Genitourinary    Burning when urinating:  Blood in urine:         Psychiatric    Major depression:         Hematologic    Bleeding problems:    Problems with blood clotting too easily:        Skin    Rashes or ulcers:        Constitutional    Fever or chills:      PHYSICAL EXAMINATION:  There were no vitals filed for this visit.  General:  WDWN in NAD; vital signs documented above Gait: Not observed HENT: WNL, normocephalic Pulmonary: normal non-labored breathing , without wheezing Cardiac: regular HR Abdomen: soft, NT, no masses Skin: without rashes Vascular Exam/Pulses:  Right Left  Radial 2+ (normal) 2+ (normal)  Ulnar            DP 2+ (normal) 2+ (normal)       Extremities: without ischemic changes, without Gangrene , without cellulitis; without open wounds;  Musculoskeletal: no muscle wasting or atrophy  Neurologic: A&O X 3;  No focal weakness or paresthesias are detected Psychiatric:  The pt has Normal affect.   Non-Invasive Vascular Imaging:   IMPRESSION: 1. No acute vascular pathology. 2. Large lobulated mass in the right pelvis encasing the right iliac vessels and abutting the anterior pelvic wall as well as right iliacus muscle and right bladder dome. This mass consistent with a neoplastic process and features most suggestive of lymphoma. Clinical correlation recommended. 3. Multiple splenic lesions most consistent with metastatic disease. 4. Short segment thick wall small bowel in the mid abdomen to the right of midline most consistent with infiltrative malignancy/lymphoma. 5. Right pelvic sidewall mass/adenopathy. 6. Cholelithiasis. 7. Colonic diverticulosis. 8.  Aortic Atherosclerosis (ICD10-I70.0)    ASSESSMENT/PLAN: ZYAIR KONCZAL is a 77 y.o. male presenting with a 3-week history of right lower extremity swelling with imaging demonstrating deep venous thrombosis.  Ultrasound noted mass effect prompting CTA.  There is some concern for lymphoma with splenic lesions appreciated, as well as possible small  bowel invasion.  I have been on the phone with medical oncology (Dr. Truett Perna), surgical oncology, interventional radiology.  The plan will be for IR core biopsy with my colleague Dr. Elby Showers, followed by lymphoma clinic referral with plans to see Dr. Mariane Baumgarten Tuesday.  Should the core biopsy sample be insufficient, Dr. Sophronia Simas has offered to perform a surgical biopsy.  Being that he is asymptomatic other than swelling in the right lower extremity, I do not plan on treating the deep venous thrombosis with percutaneous thrombectomy.  He is currently being anticoagulated, and will be for the foreseeable future due to his malignancy.  Plan is to see him in 3 months time with repeat imaging.  Asked that he call if any questions or concerns arise.  I am very appreciative of the teamwork and interdisciplinary care to expedite his diagnosis and malignancy treatment.   Victorino Sparrow, MD [facility] [phone]

## 2023-06-17 ENCOUNTER — Encounter: Payer: Self-pay | Admitting: Vascular Surgery

## 2023-06-17 ENCOUNTER — Ambulatory Visit (INDEPENDENT_AMBULATORY_CARE_PROVIDER_SITE_OTHER): Payer: Medicare Other | Admitting: Vascular Surgery

## 2023-06-17 VITALS — BP 110/72 | HR 79 | Temp 98.3°F | Resp 20 | Ht 71.0 in | Wt 198.0 lb

## 2023-06-17 DIAGNOSIS — I82421 Acute embolism and thrombosis of right iliac vein: Secondary | ICD-10-CM | POA: Diagnosis not present

## 2023-06-17 DIAGNOSIS — R1900 Intra-abdominal and pelvic swelling, mass and lump, unspecified site: Secondary | ICD-10-CM

## 2023-06-17 DIAGNOSIS — M7989 Other specified soft tissue disorders: Secondary | ICD-10-CM

## 2023-06-17 NOTE — Progress Notes (Signed)
Oley Balm, MD  Leodis Rains D PROCEDURE / BIOPSY REVIEW Date: 06/17/23  Requested Biopsy site: R pelvis Reason for request: mass r/o lymphoma Imaging review: Best seen on CT 06/16/23  Decision: Approved Imaging modality to perform: Ultrasound Schedule with: Patient preference (Local vs Mod Sed) Schedule for: Any VIR  Additional comments: @VIR : in saline for lymphoma w/u  Please contact me with questions, concerns, or if issue pertaining to this request arise.  Dayne Oley Balm, MD [facility]

## 2023-06-20 ENCOUNTER — Telehealth: Payer: Self-pay | Admitting: Physician Assistant

## 2023-06-20 NOTE — Telephone Encounter (Signed)
Patient is aware of upcoming appointment time/dates 

## 2023-06-20 NOTE — Progress Notes (Signed)
[facility] Telephone:([phone]) [phone]   Fax:(336) [phone]  INITIAL CONSULTATION:  Patient Care Team: Orpha Bur, MD as PCP - General (Family Medicine) Quintella Reichert, MD as PCP - Cardiology (Cardiology) Crist Fat, MD as Attending Physician (Urology) Margaretmary Dys, MD as Consulting Physician (Radiation Oncology) Axel Filler, Larna Daughters, NP as Nurse Practitioner (Hematology and Oncology) Maryclare Labrador, RN as Registered Nurse  CHIEF COMPLAINTS/PURPOSE OF CONSULTATION:  Right pelvic mass/adenopathy, splenic lesions  ONCOLOGIC HISTORY: Presented to PCP with swelling and redness in his right leg x 3 weeks. 06/13/2023: Doppler US showed acute DVT involving right  common femoral vein, SF junction, right femoral vein, right proximal profunda vein, and external iliac vein. Incident finding included pelvic lesion with minimum vascularity anterior to the prostate, measuring 11.8 x 9.6 12.6 cm, with a volume of 749.3 cubic centimeters. Prior MR on 04/11/2021 reports 1.7 x .07 x 1.1 cm category 4 lesion. This lesion appears to be causing extrinsic compression on the external iliac vein (proximal external iliac vein not visualized due to extrinsic compression).  06/16/2023: CT angio abdomen/pelvis: Large lobulated mass in the right pelvis encasing the right iliac vessels and abutting the anterior pelvic wall as well as right iliacus muscle and right bladder dome. This mass consistent with a neoplastic process and features most suggestive of lymphoma.Multiple splenic lesions most consistent with metastatic disease.Short segment thick wall small bowel in the mid abdomen to the right of midline most consistent with infiltrative malignancy/lymphoma. Right pelvic sidewall mass/adenopathy.Cholelithiasis. Colonic diverticulosis. Aortic Atherosclerosis. 06/21/2023: Establish care with [facility].    HISTORY OF PRESENTING ILLNESS:  Eugene Moreno 77 y.o. male with medical history significant for prostate cancer s/p seed implantation in 2022 presents to the diagnostic clinic for evaluation for abnormal CT imaging concerning for large pelvic mass. He is accompanied by his wife for this visit.   On exam today, Eugene Moreno reports that that he has some pain in his right groin but the swelling his right lower extremity has improved since starting Eliquis therapy. He denies any changes to his energy and he continues to complete his ADLs on his own. He denies any nausea, vomiting or abdominal pain. His bowel habits are unchanged without recurrent episodes of diarrhea or constipation. He denies easy bruising or signs of bleeding. He denies any fevers, chills,sweats, shortness of breath, chest pain or cough. He has no other complaints. Rest of the ROS is below.   MEDICAL HISTORY:  Past Medical History:  Diagnosis Date   Coronary artery disease    cardiologist--- dr t. 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)    urologist-- dr Marlou Porch--- dx 05/ 2022, Gleason 4+3, PSA 8.04   S/P drug eluting coronary stent placement 09/06/2001   x1 to prox LAD   Wears glasses    Wears hearing aid in both ears     SURGICAL HISTORY: Past Surgical History:  Procedure Laterality Date   ANTERIOR CERVICAL DECOMP/DISCECTOMY FUSION  01/26/2002   @[facility]  by dr Jeral Fruit;  C6-C7   COLONOSCOPY  2020   CORONARY ANGIOPLASTY WITH STENT PLACEMENT  09/06/2001   @[facility]  by dr s. 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;  Service: Urology;  Laterality: N/A;   WISDOM TOOTH EXTRACTION      SOCIAL HISTORY: Social History   Socioeconomic History   Marital status: Married    Spouse name: Not on file   Number of children: Not on file   Years of education: Not on file   Highest education level: Not on file  Occupational History   Occupation: retired    Associate Professor: [employer]    Comment: Systems analyst  Tobacco Use   Smoking status: Former    Years: 4    Types: Cigarettes    Quit date: 1969    Years since quitting: 55.5   Smokeless tobacco: Never  Vaping Use   Vaping Use: Never used  Substance and Sexual Activity   Alcohol use: Yes    Comment: occasion   Drug use: Never   Sexual activity: Yes    Comment: vasectomy  Other Topics Concern   Not on file  Social History Narrative   Not on file   Social Determinants of Health   Financial Resource Strain: Not on file  Food Insecurity: Not on file  Transportation Needs: Not on file  Physical Activity: Not on file  Stress: Not on file  Social Connections: Not on file  Intimate Partner Violence: Not on file    FAMILY HISTORY: Family History  Problem Relation Age of Onset   Breast cancer Mother 91   Heart attack Father        x3 between ages 71-64   Other Sister 29       angioplasty    ALLERGIES:  is allergic to niacin and related.  MEDICATIONS:  Current Outpatient Medications  Medication Sig Dispense Refill   apixaban (ELIQUIS) 5 MG TABS tablet Take 1 tablet (5 mg total) by mouth 2 (two) times daily. Start taking after completion of starter  pack. 60 tablet 5   APIXABAN (ELIQUIS) VTE STARTER PACK (10MG  AND 5MG ) Take as directed on package: start with two-5mg  tablets twice daily for 7 days. On day 8, switch to one-5mg  tablet twice daily. 74 each 0   aspirin EC 81 MG tablet Take 2 tablets (162 mg total) by mouth daily.     Cetirizine HCl (ZYRTEC ALLERGY) 10 MG CAPS Take 1 capsule by mouth daily.     Cod Liver Oil CAPS Take 2 capsules by mouth daily.     ferrous fumarate (HEMOCYTE - 106 MG FE) 325 (106 FE) MG TABS Take 1 tablet by mouth daily.     fluticasone (FLONASE) 50 MCG/ACT nasal spray Place 1 spray into both nostrils 2 (two) times daily.     folic acid (FOLVITE) 800 MCG tablet Take 800 mcg by mouth daily.      icosapent Ethyl (VASCEPA) 1 g capsule Take 2 capsules (2 g total) by mouth 2 (two) times daily. 120 capsule 8   ramipril (ALTACE) 2.5  MG capsule TAKE 1 CAPSULE BY MOUTH EVERY DAY 90 capsule 0   rosuvastatin (CRESTOR) 10 MG tablet TAKE 1 TABLET BY MOUTH EVERY DAY 90 tablet 3   vitamin C (ASCORBIC ACID) 500 MG tablet Take 1,000 mg by mouth daily.      vitamin E 400 UNIT capsule Take 400 Units by mouth daily.     No current facility-administered medications for this visit.    REVIEW OF SYSTEMS:   Constitutional: ( - ) fevers, ( - )  chills , ( - ) night sweats Eyes: ( - ) blurriness of vision, ( - ) double vision, ( - ) watery eyes Ears, nose, mouth, throat, and face: ( - ) mucositis, ( - ) sore throat Respiratory: ( - ) cough, ( - ) dyspnea, ( - ) wheezes Cardiovascular: ( - ) palpitation, ( - ) chest discomfort, ( + ) lower extremity swelling Gastrointestinal:  ( - ) nausea, ( - ) heartburn, ( - ) change in bowel habits Skin: ( - ) abnormal skin rashes Lymphatics: ( - ) new lymphadenopathy, ( - ) easy bruising Neurological: ( - ) numbness, ( - ) tingling, ( - ) new weaknesses Behavioral/Psych: ( - ) mood change, ( - ) new changes  All other systems were reviewed with the patient and are negative.  PHYSICAL  EXAMINATION: ECOG PERFORMANCE STATUS: 1 - Symptomatic but completely ambulatory  There were no vitals filed for this visit. There were no vitals filed for this visit.  GENERAL: well appearing male in NAD  SKIN: skin color, texture, turgor are normal, no rashes or significant lesions EYES: conjunctiva are pink and non-injected, sclera clear OROPHARYNX: no exudate, no erythema; lips, buccal mucosa, and tongue normal  NECK: supple, non-tender LYMPH:  no palpable lymphadenopathy in the cervical or supraclavicular lymph nodes.  LUNGS: clear to auscultation and percussion with normal breathing effort HEART: regular rate & rhythm and no murmurs. Right lower extremity edema, wearing compression stocking ABDOMEN: soft, non-tender, non-distended, normal bowel sounds Musculoskeletal: no cyanosis of digits and no clubbing  PSYCH: alert & oriented x 3, fluent speech NEURO: no focal motor/sensory deficits  LABORATORY DATA:  I have reviewed the data as listed    Latest Ref Rng & Units 08/31/2021   10:28 AM 11/05/2016    9:40 AM 11/03/2015   11:41 AM  CBC  WBC 4.0 - 10.5 K/uL 3.7  3.9  3.0   Hemoglobin 13.0 - 17.0 g/dL 69.6  29.5  28.4   Hematocrit 39.0 - 52.0 % 44.6  43.4  44.0   Platelets 150 - 400 K/uL 175  149  160        Latest Ref Rng & Units 05/03/2023    8:30 AM 04/19/2022    9:43 AM 08/31/2021   10:28 AM  CMP  Glucose 70 - 99 mg/dL   132   BUN 8 - 23 mg/dL   16   Creatinine 4.40 - 1.24 mg/dL   1.02   Sodium 725 - 366 mmol/L   139   Potassium 3.5 - 5.1 mmol/L   4.2   Chloride 98 - 111 mmol/L   106   CO2 22 - 32 mmol/L   25   Calcium 8.9 - 10.3 mg/dL   9.3   Total Protein 6.0 - 8.5 g/dL 7.7   7.9   Total Bilirubin 0.0 - 1.2 mg/dL 0.7   0.9   Alkaline Phos 44 - 121 IU/L 90   63  AST 0 - 40 IU/L 28   29   ALT 0 - 44 IU/L 37  25  37      RADIOGRAPHIC STUDIES: I have personally reviewed the radiological images as listed and agreed with the findings in the report. CT ANGIO  ABDOMEN PELVIS  W & WO CONTRAST  Result Date: 06/16/2023 CLINICAL DATA:  Iliac artery dissection. EXAM: CTA ABDOMEN AND PELVIS WITHOUT AND WITH CONTRAST TECHNIQUE: Multidetector CT imaging of the abdomen and pelvis was performed using the standard protocol during bolus administration of intravenous contrast. Multiplanar reconstructed images and MIPs were obtained and reviewed to evaluate the vascular anatomy. RADIATION DOSE REDUCTION: This exam was performed according to the departmental dose-optimization program which includes automated exposure control, adjustment of the mA and/or kV according to patient size and/or use of iterative reconstruction technique. CONTRAST:  OMNIPAQUE IOHEXOL 350 MG/ML SOLN COMPARISON:  None Available. FINDINGS: VASCULAR Aorta: Mild atherosclerotic calcification of the abdominal aorta. No aneurysmal dilatation or dissection. No periaortic fluid collection. Celiac: Patent without evidence of aneurysm, dissection, vasculitis or significant stenosis. SMA: Patent without evidence of aneurysm, dissection, vasculitis or significant stenosis. Renals: Both renal arteries are patent without evidence of aneurysm, dissection, vasculitis, fibromuscular dysplasia or significant stenosis. IMA: Patent without evidence of aneurysm, dissection, vasculitis or significant stenosis. Inflow: Mild atherosclerotic calcification. No aneurysmal dilatation or dissection. The iliac arteries are patent. Proximal Outflow: The visualized proximal outflow is patent. Veins: The IVC is unremarkable. The SMV, splenic vein, and main portal vein are patent. No portal venous gas. Review of the MIP images confirms the above findings. NON-VASCULAR Lower chest: The visualized lung bases are clear. There is coronary vascular calcification. No intra-abdominal free air or free fluid. Hepatobiliary: The liver is unremarkable. No biliary dilatation. The gallbladder is partially contracted. Several small stones noted in the  gallbladder. No pericholecystic fluid or evidence of acute cholecystitis by CT. Pancreas: Unremarkable. No pancreatic ductal dilatation or surrounding inflammatory changes. Spleen: Multiple hypoenhancing splenic lesions most consistent with metastatic disease. These measure up to approximately 3.2 cm. Adrenals/Urinary Tract: The adrenal glands are unremarkable. There is no hydronephrosis on either side. Small left renal cysts. The visualized ureters appear unremarkable. The urinary bladder is minimally distended. There is compression of the right bladder dome by the pelvic mass. Stomach/Bowel: There is sigmoid diverticulosis and scattered colonic diverticula without active inflammatory changes. There is moderate stool throughout the colon. There is dilatation of a short segment thick wall small bowel in the mid abdomen to the right of midline (axial 35/11 and coronal 54/13) most consistent with infiltrative malignancy/lymphoma. There is no bowel obstruction or active inflammation. The appendix is normal. Lymphatic: Right pelvic sidewall mass/adenopathy measures 3.3 x 2.9 cm (68/11). Reproductive: Prostate brachytherapy seeds. Other: There is a 10 x 13 cm lobulated soft tissue mass in the right pelvis abutting the right iliacus muscle and encasing the right iliac vessels. This mass demonstrates areas of decreased enhancement centrally suggestive of necrotic tissue. The mass abuts the right bladder dome with loss of fat plane concerning for infiltration into the bladder wall. Additionally this mass abuts the anterior peritoneal wall and right rectus sheath with loss of fat plane. There is also extension of the mass into the right inguinal canal. Musculoskeletal: No acute osseous pathology. IMPRESSION: 1. No acute vascular pathology. 2. Large lobulated mass in the right pelvis encasing the right iliac vessels and abutting the anterior pelvic wall as well as right iliacus muscle and right bladder dome. This mass  consistent with a neoplastic process and features most suggestive of lymphoma. Clinical correlation recommended. 3. Multiple splenic lesions most consistent with metastatic disease. 4. Short segment thick wall small bowel in the mid abdomen to the right of midline most consistent with infiltrative malignancy/lymphoma. 5. Right pelvic sidewall mass/adenopathy. 6. Cholelithiasis. 7. Colonic diverticulosis. 8.  Aortic Atherosclerosis (ICD10-I70.0). Electronically Signed   By: Elgie Collard M.D.   On: 06/16/2023 16:06   VAS [place] LOWER EXTREMITY VENOUS (DVT)  Result Date: 06/13/2023  Lower Venous DVT Study Patient Name:  Eugene Moreno  Date of Exam:   06/13/2023 Medical Rec #: [id number]          Accession #:    [id number] Date of Birth: [birth year]          Patient Gender: M Patient Age:   61 years Exam Location:  [facility] Procedure:      VAS [place] LOWER EXTREMITY VENOUS (DVT) Referring Phys: Clayburn Pert MADDEN --------------------------------------------------------------------------------  Indications: Patient reports increase right lower extremity swelling after feeling a "pop" about 3 weeks ago. He denies SOB.  Risk Factors: None identified. Comparison Study: NA Performing Technologist: Tyna Jaksch RVT  Examination Guidelines: A complete evaluation includes B-mode imaging, spectral Doppler, color Doppler, and power Doppler as needed of all accessible portions of each vessel. Bilateral testing is considered an integral part of a complete examination. Limited examinations for reoccurring indications may be performed as noted. The reflux portion of the exam is performed with the patient in reverse Trendelenburg.  +---------+---------------+---------+-----------+----------------+-------------+ RIGHT    CompressibilityPhasicitySpontaneityProperties      Thrombus                                                                  Aging          +---------+---------------+---------+-----------+----------------+-------------+ CFV      Full           No       No         spongy          Acute                                                     w/compression                 +---------+---------------+---------+-----------+----------------+-------------+ SFJ      Full           No       No         spongy          Acute                                                     w/compression                 +---------+---------------+---------+-----------+----------------+-------------+ FV Prox  Full           No       No  spongy          Acute                                                     w/compression                 +---------+---------------+---------+-----------+----------------+-------------+ FV Mid   Full           Yes      Yes                                      +---------+---------------+---------+-----------+----------------+-------------+ FV DistalFull           Yes      Yes                                      +---------+---------------+---------+-----------+----------------+-------------+ PFV      Full           No       No         spongy          Acute                                                     w/compression                 +---------+---------------+---------+-----------+----------------+-------------+ POP      Full           Yes      Yes                                      +---------+---------------+---------+-----------+----------------+-------------+ PTV      Full                                                             +---------+---------------+---------+-----------+----------------+-------------+ PERO     Full                                                             +---------+---------------+---------+-----------+----------------+-------------+ Gastroc  Full                                                              +---------+---------------+---------+-----------+----------------+-------------+ GSV      Full           Yes      No  spongy          Acute                                                     w/compression                 +---------+---------------+---------+-----------+----------------+-------------+ EIV                     No       No         softly echogenicAcute         +---------+---------------+---------+-----------+----------------+-------------+   Right Technical Findings: Acute occlusive thrombus in the mid and distal external iliac vein CFV and SFJ. Retrograde flow in the GSV at proximal thigh level. Acute non-occlusive thrombus in the proximal femoral vein and deep femoral vein. Rouleau flow noted in the popliteal vein. There is an inhomogeneous lesion with minimum vascularity anterior to the prostate, measuring 11.8 x 9.6 12.6 cm, with a volume of 749.3 cubic centimeters. Prior MR on 04/11/2021 reports 1.7 x .07 x 1.1 cm category 4 lesion. This lesion appears to be causing extrinsic compression on the external iliac vein (proximal external iliac vein not visualized due to extrinsic compression).  +----+---------------+---------+-----------+----------+--------------+ LEFTCompressibilityPhasicitySpontaneityPropertiesThrombus Aging +----+---------------+---------+-----------+----------+--------------+ CFV Full           Yes      Yes                                 +----+---------------+---------+-----------+----------+--------------+    Findings reported to Dr. Rubye Oaks, and Tamela Oddi at [facility] at 12:10 pm.  Summary: RIGHT: - Findings consistent with acute deep vein thrombosis involving the right common femoral vein, SF junction, right femoral vein, right proximal profunda vein, and external iliac vein. - No cystic structure found in the popliteal fossa.  LEFT: - No evidence of common femoral vein obstruction.  *See table(s) above for measurements and  observations. Electronically signed by Coral Else MD on 06/13/2023 at 3:50:16 PM.    Final     ASSESSMENT & PLAN JAGDISH SONN is a 77 y.o. male who presents to the diagnostic clinic for evaluation of pelvic mass seen on CT imaging from 06/16/2023.   #Pelvic Mass #Splenic lesions --Seen on CTA abdomen/pelvis from 06/16/2023 --Scheduled for US guided biopsy of pelvic mass on 06/24/2023.  --Plan for further imaging based on biopsy results.  --Labs today to check CBC, CMP, PSA, LDH, flow cytometry, ESR and CRP levels --RTC on 06/30/2023 to finalize biopsy results and discuss treatment recommendations.   #Small bowel thickening: --Etiology unknown but could be secondary to infiltratrive malignancy --Will determine if further evaluation is needed based on upcoming pelvic biopsy results.  ---Consider referral to GI for endoscopic evaluation  #Right lower extremity DVT: --Likely secondary to underlying malignancy --Currently on Eliquis therapy. Recommend to hold 48 hours before upcoming biopsy and bridge with Lovenox therapy. Hold Lovenox 12 hours before biopsy. Sent prescription for Lovenox injections.   Prostate cancer: --Treated with brachytherapy/seed implantation in 2022 with Dr. Kathrynn Running  No orders of the defined types were placed in this encounter.   All questions were answered. The patient knows to call the clinic with any problems, questions or concerns.  I have spent a total of 60 minutes minutes  of face-to-face and non-face-to-face time, preparing to see the patient, obtaining and/or reviewing separately obtained history, performing a medically appropriate examination, counseling and educating the patient, ordering tests/procedures,  documenting clinical information in the electronic health record, and care coordination.   Georga Kaufmann, PA-C Department of Hematology/Oncology [facility] at [facility] Phone: [phone]  Patient was seen with Dr.  Leonides Schanz.  I have read the above note and personally examined the patient. I agree with the assessment and plan as noted above.  Briefly Mr. Pucillo is a 77 year old male who presents for evaluation of large pelvic mass as well as splenic lesions.  At this time findings are most concerning for lymphoma, though other malignancies remain on the differential.  Today we will start our workup with a PSA, LDH, ESR, and CRP.  Additionally we will order a flow cytometry.  In the event our blood work is negative would recommend pursuing a biopsy of the pelvic mass.  A core biopsy should be adequate.  Once a tissue diagnosis is made we can determine if any further imaging is required (PET CT scan).  The patient voiced understanding of our findings and the plan moving forward.   Ulysees Barns, MD Department of Hematology/Oncology [facility] at [facility] Phone: [phone] Pager: [phone] Email: [email or web address]

## 2023-06-21 ENCOUNTER — Inpatient Hospital Stay: Payer: Medicare Other

## 2023-06-21 ENCOUNTER — Other Ambulatory Visit: Payer: Self-pay

## 2023-06-21 ENCOUNTER — Other Ambulatory Visit: Payer: Self-pay | Admitting: Cardiology

## 2023-06-21 ENCOUNTER — Encounter: Payer: Self-pay | Admitting: Physician Assistant

## 2023-06-21 ENCOUNTER — Inpatient Hospital Stay: Payer: Medicare Other | Attending: Physician Assistant | Admitting: Physician Assistant

## 2023-06-21 ENCOUNTER — Inpatient Hospital Stay: Payer: Medicare Other | Admitting: Physician Assistant

## 2023-06-21 VITALS — BP 116/71 | HR 74 | Temp 98.0°F | Resp 20 | Wt 197.0 lb

## 2023-06-21 DIAGNOSIS — N281 Cyst of kidney, acquired: Secondary | ICD-10-CM | POA: Insufficient documentation

## 2023-06-21 DIAGNOSIS — Z8546 Personal history of malignant neoplasm of prostate: Secondary | ICD-10-CM

## 2023-06-21 DIAGNOSIS — I82419 Acute embolism and thrombosis of unspecified femoral vein: Secondary | ICD-10-CM | POA: Diagnosis not present

## 2023-06-21 DIAGNOSIS — I700 Atherosclerosis of aorta: Secondary | ICD-10-CM | POA: Diagnosis not present

## 2023-06-21 DIAGNOSIS — C8333 Diffuse large B-cell lymphoma, intra-abdominal lymph nodes: Secondary | ICD-10-CM | POA: Diagnosis present

## 2023-06-21 DIAGNOSIS — Z9221 Personal history of antineoplastic chemotherapy: Secondary | ICD-10-CM | POA: Insufficient documentation

## 2023-06-21 DIAGNOSIS — I2510 Atherosclerotic heart disease of native coronary artery without angina pectoris: Secondary | ICD-10-CM | POA: Diagnosis not present

## 2023-06-21 DIAGNOSIS — M7989 Other specified soft tissue disorders: Secondary | ICD-10-CM | POA: Diagnosis not present

## 2023-06-21 DIAGNOSIS — Z79633 Long term (current) use of mitotic inhibitor: Secondary | ICD-10-CM | POA: Diagnosis not present

## 2023-06-21 DIAGNOSIS — Z79630 Long term (current) use of alkylating agent: Secondary | ICD-10-CM | POA: Insufficient documentation

## 2023-06-21 DIAGNOSIS — Z7901 Long term (current) use of anticoagulants: Secondary | ICD-10-CM | POA: Diagnosis not present

## 2023-06-21 DIAGNOSIS — R599 Enlarged lymph nodes, unspecified: Secondary | ICD-10-CM | POA: Diagnosis not present

## 2023-06-21 DIAGNOSIS — Z79899 Other long term (current) drug therapy: Secondary | ICD-10-CM

## 2023-06-21 DIAGNOSIS — I119 Hypertensive heart disease without heart failure: Secondary | ICD-10-CM | POA: Diagnosis not present

## 2023-06-21 DIAGNOSIS — Z79632 Long term (current) use of antitumor antibiotic: Secondary | ICD-10-CM | POA: Insufficient documentation

## 2023-06-21 DIAGNOSIS — R1900 Intra-abdominal and pelvic swelling, mass and lump, unspecified site: Secondary | ICD-10-CM

## 2023-06-21 DIAGNOSIS — K219 Gastro-esophageal reflux disease without esophagitis: Secondary | ICD-10-CM | POA: Insufficient documentation

## 2023-06-21 DIAGNOSIS — M79604 Pain in right leg: Secondary | ICD-10-CM

## 2023-06-21 DIAGNOSIS — I371 Nonrheumatic pulmonary valve insufficiency: Secondary | ICD-10-CM | POA: Insufficient documentation

## 2023-06-21 DIAGNOSIS — I252 Old myocardial infarction: Secondary | ICD-10-CM

## 2023-06-21 DIAGNOSIS — Z79620 Long term (current) use of immunosuppressive biologic: Secondary | ICD-10-CM | POA: Insufficient documentation

## 2023-06-21 DIAGNOSIS — Z87891 Personal history of nicotine dependence: Secondary | ICD-10-CM

## 2023-06-21 DIAGNOSIS — Z803 Family history of malignant neoplasm of breast: Secondary | ICD-10-CM

## 2023-06-21 DIAGNOSIS — K5730 Diverticulosis of large intestine without perforation or abscess without bleeding: Secondary | ICD-10-CM | POA: Diagnosis not present

## 2023-06-21 DIAGNOSIS — I82491 Acute embolism and thrombosis of other specified deep vein of right lower extremity: Secondary | ICD-10-CM

## 2023-06-21 DIAGNOSIS — K8020 Calculus of gallbladder without cholecystitis without obstruction: Secondary | ICD-10-CM | POA: Insufficient documentation

## 2023-06-21 DIAGNOSIS — E785 Hyperlipidemia, unspecified: Secondary | ICD-10-CM | POA: Diagnosis not present

## 2023-06-21 DIAGNOSIS — D7389 Other diseases of spleen: Secondary | ICD-10-CM

## 2023-06-21 DIAGNOSIS — Z8249 Family history of ischemic heart disease and other diseases of the circulatory system: Secondary | ICD-10-CM

## 2023-06-21 LAB — CBC WITH DIFFERENTIAL (CANCER CENTER ONLY)
Abs Immature Granulocytes: 0.01 10*3/uL (ref 0.00–0.07)
Basophils Absolute: 0 10*3/uL (ref 0.0–0.1)
Basophils Relative: 1 %
Eosinophils Absolute: 0.1 10*3/uL (ref 0.0–0.5)
Eosinophils Relative: 3 %
HCT: 42.4 % (ref 39.0–52.0)
Hemoglobin: 14 g/dL (ref 13.0–17.0)
Immature Granulocytes: 0 %
Lymphocytes Relative: 9 %
Lymphs Abs: 0.4 10*3/uL — ABNORMAL LOW (ref 0.7–4.0)
MCH: 31.5 pg (ref 26.0–34.0)
MCHC: 33 g/dL (ref 30.0–36.0)
MCV: 95.5 fL (ref 80.0–100.0)
Monocytes Absolute: 0.5 10*3/uL (ref 0.1–1.0)
Monocytes Relative: 13 %
Neutro Abs: 3.2 10*3/uL (ref 1.7–7.7)
Neutrophils Relative %: 74 %
Platelet Count: 213 10*3/uL (ref 150–400)
RBC: 4.44 MIL/uL (ref 4.22–5.81)
RDW: 13.1 % (ref 11.5–15.5)
WBC Count: 4.3 10*3/uL (ref 4.0–10.5)
nRBC: 0 % (ref 0.0–0.2)

## 2023-06-21 LAB — CMP (CANCER CENTER ONLY)
ALT: 16 U/L (ref 0–44)
AST: 18 U/L (ref 15–41)
Albumin: 4.1 g/dL (ref 3.5–5.0)
Alkaline Phosphatase: 66 U/L (ref 38–126)
Anion gap: 7 (ref 5–15)
BUN: 15 mg/dL (ref 8–23)
CO2: 30 mmol/L (ref 22–32)
Calcium: 10.2 mg/dL (ref 8.9–10.3)
Chloride: 102 mmol/L (ref 98–111)
Creatinine: 0.85 mg/dL (ref 0.61–1.24)
GFR, Estimated: >60 mL/min (ref >60)
Glucose, Bld: 90 mg/dL (ref 70–99)
Potassium: 4.5 mmol/L (ref 3.5–5.1)
Sodium: 139 mmol/L (ref 135–145)
Total Bilirubin: 0.5 mg/dL (ref 0.3–1.2)
Total Protein: 6.9 g/dL (ref 6.5–8.1)

## 2023-06-21 LAB — LACTATE DEHYDROGENASE: LDH: 152 U/L (ref 98–192)

## 2023-06-21 LAB — SEDIMENTATION RATE: Sed Rate: 27 mm/hr — ABNORMAL HIGH (ref 0–16)

## 2023-06-21 LAB — C-REACTIVE PROTEIN: CRP: 1.1 mg/dL — ABNORMAL HIGH (ref <1.0)

## 2023-06-21 MED ORDER — ENOXAPARIN SODIUM 100 MG/ML IJ SOSY: 90.0000 mg | PREFILLED_SYRINGE | Freq: Two times a day (BID) | INTRAMUSCULAR | 0 refills | Status: DC

## 2023-06-22 ENCOUNTER — Telehealth: Payer: Self-pay | Admitting: Physician Assistant

## 2023-06-22 ENCOUNTER — Other Ambulatory Visit: Payer: Self-pay | Admitting: Radiology

## 2023-06-22 DIAGNOSIS — R1900 Intra-abdominal and pelvic swelling, mass and lump, unspecified site: Secondary | ICD-10-CM

## 2023-06-22 LAB — PROSTATE-SPECIFIC AG, SERUM (LABCORP): Prostate Specific Ag, Serum: <0.1 ng/mL (ref 0.0–4.0)

## 2023-06-22 LAB — SURGICAL PATHOLOGY

## 2023-06-22 NOTE — Telephone Encounter (Signed)
Left patient a message regarding upcoming appointment dates/times.

## 2023-06-23 NOTE — Progress Notes (Signed)
Chief Complaint: Patient was seen in consultation today for CT guided right pelvic mass biopsy  Referring Physician(s): Robins,Joshua E  Supervising Physician: Gilmer Mor  Patient Status: [facility] - Out-pt  History of Present Illness: Eugene Moreno is a 77 y.o. male with PMH sig for CAD/MI/stenting, HLD,HTN, chronic leukopenia, prostate cancer 2022 who presents now with recently diagnosed RLE DVT(on eliquis) and CT angio abdomen/pelvis on 06/16/23 revealing: Large lobulated mass in the right pelvis encasing the right iliac vessels and abutting the anterior pelvic wall as well as right iliacus muscle and right bladder dome. This mass consistent with a neoplastic process and features most suggestive of lymphoma.Multiple splenic lesions most consistent with metastatic disease.Short segment thick wall small bowel in the mid abdomen to the right of midline most consistent with infiltrative malignancy/lymphoma. Right pelvic sidewall mass/adenopathy.Cholelithiasis. Colonic diverticulosis. Aortic Atherosclerosis. He is scheduled today for image guided biopsy  of the right pelvic mass.   Past Medical History:  Diagnosis Date   Coronary artery disease    cardiologist--- dr t. turner;  hx AWMI 09-06-2001  s/p cath with PCI and DES x1 to prox LAD;  last nuclear study 05-10-2016 in epic, anterior scar without ishcemia, nuclear ef 44%)   History of MI (myocardial infarction) 09/06/2001   anterior wall   Hyperlipemia    Hypertension    Leukopenia    chronic mild   Prostate cancer [facility])    urologist-- dr Marlou Porch--- dx 05/ 2022, Gleason 4+3, PSA 8.04   S/P drug eluting coronary stent placement 09/06/2001   x1 to prox LAD   Wears glasses    Wears hearing aid in both ears     Past Surgical History:  Procedure Laterality Date   ANTERIOR CERVICAL DECOMP/DISCECTOMY FUSION  01/26/2002   @[facility]  by dr Jeral Fruit;   C6-C7   COLONOSCOPY  2020   CORONARY ANGIOPLASTY WITH STENT PLACEMENT  09/06/2001   @[facility]   by dr s. 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 Authorizing Provider  apixaban (ELIQUIS) 5 MG TABS tablet Take 1 tablet (5 mg total) by mouth 2 (two) times daily. Start taking after completion of starter pack. Patient not taking: Reported on 06/21/2023 06/13/23   Pervis Hocking B, RPH-CPP  APIXABAN Everlene Balls) VTE STARTER PACK (10MG  AND 5MG ) Take as directed on package: start with two-5mg  tablets twice daily for 7 days. On day 8, switch to one-5mg  tablet twice daily. 06/13/23   Pervis Hocking B, RPH-CPP  aspirin EC 81 MG tablet Take 2 tablets (162 mg total) by mouth daily. 10/18/11   Laurey Morale, MD  Cetirizine HCl (ZYRTEC ALLERGY) 10 MG CAPS Take 1 capsule by mouth daily.    [provider]  [facility] Liver Oil CAPS Take 2 capsules by mouth daily.    [provider]  enoxaparin (LOVENOX) 100 MG/ML injection Inject 0.9 mLs (90  mg total) into the skin every 12 (twelve) hours for 2 days. Hold injection 12 hours before biopsy. 06/21/23 06/23/23  Georga Kaufmann T, PA-C  ferrous fumarate (HEMOCYTE - 106 MG FE) 325 (106 FE) MG TABS Take 1 tablet by mouth daily.    [provider]  fluticasone (FLONASE) 50 MCG/ACT nasal spray Place 1 spray into both nostrils 2 (two) times daily. 04/07/23   [provider]  folic acid (FOLVITE) 800 MCG tablet Take 800  mcg by mouth daily.     [provider]  icosapent Ethyl (VASCEPA) 1 g capsule Take 2 capsules (2 g total) by mouth 2 (two) times daily. 04/26/23   Sharlene Dory, PA-C  ramipril (ALTACE) 2.5 MG capsule TAKE 1 CAPSULE BY MOUTH EVERY DAY 06/21/23   Quintella Reichert, MD  rosuvastatin (CRESTOR) 10 MG tablet TAKE 1 TABLET BY MOUTH EVERY DAY 05/09/23   Quintella Reichert, MD  vitamin C (ASCORBIC ACID) 500 MG tablet Take 1,000 mg by mouth daily.     [provider]  vitamin E 400 UNIT capsule Take 400 Units by mouth daily.    [provider]     Family History  Problem Relation Age of Onset   Breast cancer Mother 13   Heart attack Father        x3 between ages 48-64   Other Sister 18       angioplasty    Social History   Socioeconomic History   Marital status: Married    Spouse name: Not on file   Number of children: Not on file   Years of education: Not on file   Highest education level: Not on file  Occupational History   Occupation: retired    Associate Professor: [employer]    Comment: Systems analyst  Tobacco Use   Smoking status: Former    Years: 4    Types: Cigarettes    Quit date: 1969    Years since quitting: 55.5   Smokeless tobacco: Never  Vaping Use   Vaping Use: Never used  Substance and Sexual Activity   Alcohol use: Yes    Comment: occasional   Drug use: Never   Sexual activity: Yes    Comment: vasectomy  Other Topics Concern   Not on file  Social History Narrative   Not on file   Social Determinants of Health   Financial Resource Strain: Not on file  Food Insecurity: No Food Insecurity (06/21/2023)   Hunger Vital Sign    Worried About Running Out of Food in the Last Year: Never true    Ran Out of Food in the Last Year: Never true  Transportation Needs: No Transportation Needs (06/21/2023)   PRAPARE - Administrator, Civil Service (Medical): No    Lack of Transportation (Non-Medical): No  Physical Activity: Not on file   Stress: Not on file  Social Connections: Not on file    Code Status: FULL CODE  Review of Systems denies fever,HA,CP,dyspnea, cough, back pain,N/V or bleeding; he does have some RLQ soreness, rt leg edema  Vital Signs: Vitals:   06/24/23 1242  BP: 111/79  Pulse: 70  Resp: 18  Temp: (!) 97.5 F (36.4 C)  SpO2: 95%      Advance Care Plan: no documents on file    Physical Exam; awake/alert; chest- CTA bilat; heart- RRR; abd- soft,+BS, mild RLQ tenderness to palpation; RLE edema noted , compression stocking in place; no LLE edema  Imaging:  CT ANGIO ABDOMEN PELVIS  W & WO CONTRAST  Result Date: 06/16/2023 CLINICAL DATA:  Iliac artery dissection. EXAM: CTA ABDOMEN AND PELVIS WITHOUT AND WITH CONTRAST TECHNIQUE: Multidetector CT imaging of the abdomen and pelvis was performed using the standard protocol during bolus administration of intravenous contrast. Multiplanar reconstructed images and MIPs were obtained and reviewed to evaluate the vascular anatomy. RADIATION DOSE REDUCTION: This exam was performed according to the departmental dose-optimization program which includes automated exposure control, adjustment of the mA and/or kV according to patient size and/or use of iterative reconstruction technique. CONTRAST:  OMNIPAQUE IOHEXOL 350 MG/ML SOLN COMPARISON:  None Available. FINDINGS: VASCULAR Aorta: Mild atherosclerotic calcification of the abdominal aorta. No aneurysmal dilatation or dissection. No periaortic fluid collection. Celiac: Patent without evidence of aneurysm, dissection, vasculitis or significant stenosis. SMA: Patent without evidence of aneurysm, dissection, vasculitis or significant stenosis. Renals: Both renal arteries are patent without evidence of aneurysm, dissection, vasculitis, fibromuscular dysplasia or significant stenosis. IMA: Patent without evidence of aneurysm, dissection, vasculitis or significant stenosis. Inflow: Mild atherosclerotic calcification. No  aneurysmal dilatation or dissection. The iliac arteries are patent. Proximal Outflow: The visualized proximal outflow is patent. Veins: The IVC is unremarkable. The SMV, splenic vein, and main portal vein are patent. No portal venous gas. Review of the MIP images confirms the above findings. NON-VASCULAR Lower chest: The visualized lung bases are clear. There is coronary vascular calcification. No intra-abdominal free air or free fluid. Hepatobiliary: The liver is unremarkable. No biliary dilatation. The gallbladder is partially contracted. Several small stones noted in the gallbladder. No pericholecystic fluid or evidence of acute cholecystitis by CT. Pancreas: Unremarkable. No pancreatic ductal dilatation or surrounding inflammatory changes. Spleen: Multiple hypoenhancing splenic lesions most consistent with metastatic disease. These measure up to approximately 3.2 cm. Adrenals/Urinary Tract: The adrenal glands are unremarkable. There is no hydronephrosis on either side. Small left renal cysts. The visualized ureters appear unremarkable. The urinary bladder is minimally distended. There is compression of the right bladder dome by the pelvic mass. Stomach/Bowel: There is sigmoid diverticulosis and scattered colonic diverticula without active inflammatory changes. There is moderate stool throughout the colon. There is dilatation of a short segment thick wall small bowel in the mid abdomen to the right of midline (axial 35/11 and coronal 54/13) most consistent with infiltrative malignancy/lymphoma. There is no bowel obstruction or active inflammation. The appendix is normal. Lymphatic: Right pelvic sidewall mass/adenopathy measures 3.3 x 2.9 cm (68/11). Reproductive: Prostate brachytherapy seeds. Other: There is a 10 x 13 cm lobulated soft tissue mass in the right pelvis abutting the right iliacus muscle and encasing the right iliac vessels. This mass demonstrates areas of decreased enhancement centrally suggestive  of necrotic tissue. The mass abuts the right bladder dome with loss of fat plane concerning for infiltration into the bladder wall. Additionally this mass abuts the anterior peritoneal wall and right rectus sheath with loss of fat plane. There is also extension of the mass into the right inguinal canal. Musculoskeletal: No acute osseous pathology. IMPRESSION: 1. No acute vascular pathology. 2. Large lobulated mass in the right pelvis encasing the right iliac vessels and abutting the anterior pelvic wall as well as right iliacus muscle and right bladder dome. This mass consistent with a neoplastic process and features most suggestive of lymphoma. Clinical correlation recommended. 3. Multiple splenic lesions most consistent with metastatic disease. 4. Short segment thick wall small bowel in the mid abdomen to the right of midline most consistent with infiltrative malignancy/lymphoma. 5.  Right pelvic sidewall mass/adenopathy. 6. Cholelithiasis. 7. Colonic diverticulosis. 8.  Aortic Atherosclerosis (ICD10-I70.0). Electronically Signed   By: Elgie Collard M.D.   On: 06/16/2023 16:06   VAS [place] LOWER EXTREMITY VENOUS (DVT)  Result Date: 06/13/2023  Lower Venous DVT Study Patient Name:  Eugene Moreno  Date of Exam:   06/13/2023 Medical Rec #: [id number]          Accession #:    [id number] Date of Birth: 1946/10/14          Patient Gender: M Patient Age:   34 years Exam Location:  [facility] Procedure:      VAS [place] LOWER EXTREMITY VENOUS (DVT) Referring Phys: Clayburn Pert MADDEN --------------------------------------------------------------------------------  Indications: Patient reports increase right lower extremity swelling after feeling a "pop" about 3 weeks ago. He denies SOB.  Risk Factors: None identified. Comparison Study: NA Performing Technologist: Tyna Jaksch RVT  Examination Guidelines: A complete evaluation includes B-mode imaging, spectral Doppler, color Doppler, and power Doppler as needed of all accessible  portions of each vessel. Bilateral testing is considered an integral part of a complete examination. Limited examinations for reoccurring indications may be performed as noted. The reflux portion of the exam is performed with the patient in reverse Trendelenburg.  +---------+---------------+---------+-----------+----------------+-------------+ RIGHT    CompressibilityPhasicitySpontaneityProperties      Thrombus                                                                  Aging         +---------+---------------+---------+-----------+----------------+-------------+ CFV      Full           No       No         spongy          Acute                                                     w/compression                 +---------+---------------+---------+-----------+----------------+-------------+ SFJ      Full           No       No         spongy          Acute                                                     w/compression                 +---------+---------------+---------+-----------+----------------+-------------+ FV Prox  Full           No       No         spongy          Acute  w/compression                 +---------+---------------+---------+-----------+----------------+-------------+ FV Mid   Full           Yes      Yes                                      +---------+---------------+---------+-----------+----------------+-------------+ FV DistalFull           Yes      Yes                                      +---------+---------------+---------+-----------+----------------+-------------+ PFV      Full           No       No         spongy          Acute                                                     w/compression                 +---------+---------------+---------+-----------+----------------+-------------+ POP      Full           Yes      Yes                                       +---------+---------------+---------+-----------+----------------+-------------+ PTV      Full                                                             +---------+---------------+---------+-----------+----------------+-------------+ PERO     Full                                                             +---------+---------------+---------+-----------+----------------+-------------+ Gastroc  Full                                                             +---------+---------------+---------+-----------+----------------+-------------+ GSV      Full           Yes      No         spongy          Acute  w/compression                 +---------+---------------+---------+-----------+----------------+-------------+ EIV                     No       No         softly echogenicAcute         +---------+---------------+---------+-----------+----------------+-------------+   Right Technical Findings: Acute occlusive thrombus in the mid and distal external iliac vein CFV and SFJ. Retrograde flow in the GSV at proximal thigh level. Acute non-occlusive thrombus in the proximal femoral vein and deep femoral vein. Rouleau flow noted in the popliteal vein. There is an inhomogeneous lesion with minimum vascularity anterior to the prostate, measuring 11.8 x 9.6 12.6 cm, with a volume of 749.3 cubic centimeters. Prior MR on 04/11/2021 reports 1.7 x .07 x 1.1 cm category 4 lesion. This lesion appears to be causing extrinsic compression on the external iliac vein (proximal external iliac vein not visualized due to extrinsic compression).  +----+---------------+---------+-----------+----------+--------------+ LEFTCompressibilityPhasicitySpontaneityPropertiesThrombus Aging +----+---------------+---------+-----------+----------+--------------+ CFV Full           Yes      Yes                                  +----+---------------+---------+-----------+----------+--------------+    Findings reported to Dr. Rubye Oaks, and Tamela Oddi at [facility] at 12:10 pm.  Summary: RIGHT: - Findings consistent with acute deep vein thrombosis involving the right common femoral vein, SF junction, right femoral vein, right proximal profunda vein, and external iliac vein. - No cystic structure found in the popliteal fossa.  LEFT: - No evidence of common femoral vein obstruction.  *See table(s) above for measurements and observations. Electronically signed by Coral Else MD on 06/13/2023 at 3:50:16 PM.    Final     Labs:  CBC: Recent Labs    06/21/23 1207  WBC 4.3  HGB 14.0  HCT 42.4  PLT 213    COAGS: No results for input(s): "INR", "APTT" in the last 8760 hours.  BMP: Recent Labs    06/21/23 1207  NA 139  K 4.5  CL 102  CO2 30  GLUCOSE 90  BUN 15  CALCIUM 10.2  CREATININE 0.85  GFRNONAA >60    LIVER FUNCTION TESTS: Recent Labs    05/03/23 0830 06/21/23 1207  BILITOT 0.7 0.5  AST 28 18  ALT 37 16  ALKPHOS 90 66  PROT 7.7 6.9  ALBUMIN 4.6 4.1    TUMOR MARKERS: No results for input(s): "AFPTM", "CEA", "CA199", "CHROMGRNA" in the last 8760 hours.  Assessment and Plan: 77 y.o. male with PMH sig for CAD/MI/stenting, HLD,HTN, chronic leukopenia, prostate cancer 2022 who presents now with recently diagnosed RLE DVT(on eliquis) and CT angio abdomen/pelvis on 06/16/23 revealing: Large lobulated mass in the right pelvis encasing the right iliac vessels and abutting the anterior pelvic wall as well as right iliacus muscle and right bladder dome. This mass consistent with a neoplastic process and features most suggestive of lymphoma.Multiple splenic lesions most consistent with metastatic disease.Short segment thick wall small bowel in the mid abdomen to the right of midline most consistent with infiltrative malignancy/lymphoma. Right pelvic sidewall mass/adenopathy.Cholelithiasis. Colonic  diverticulosis. Aortic Atherosclerosis. He is scheduled today for image guided biopsy  of the right pelvic mass. Risks and benefits of procedure was discussed with the patient  including, but not limited to bleeding, infection, damage to adjacent  structures or low yield requiring additional tests.  All of the questions were answered and there is agreement to proceed.  Consent signed and in chart.    Thank you for this interesting consult.  I greatly enjoyed meeting Eugene Moreno and look forward to participating in their care.  A copy of this report was sent to the requesting provider on this date.  Electronically Signed: D. Jeananne Rama, PA-C 06/23/2023, 4:58 PM   I spent a total of  25 minutes   in face to face in clinical consultation, greater than 50% of which was counseling/coordinating care for image guided right pelvic mass biopsy

## 2023-06-24 ENCOUNTER — Ambulatory Visit (HOSPITAL_COMMUNITY)
Admission: RE | Admit: 2023-06-24 | Discharge: 2023-06-24 | Disposition: A | Discharge status: Home / Self Care | Payer: Medicare Other | Source: Ambulatory Visit | Attending: Vascular Surgery | Admitting: Vascular Surgery

## 2023-06-24 ENCOUNTER — Encounter (HOSPITAL_COMMUNITY): Payer: Self-pay

## 2023-06-24 ENCOUNTER — Other Ambulatory Visit: Payer: Self-pay

## 2023-06-24 DIAGNOSIS — K8020 Calculus of gallbladder without cholecystitis without obstruction: Secondary | ICD-10-CM | POA: Diagnosis not present

## 2023-06-24 DIAGNOSIS — Z86718 Personal history of other venous thrombosis and embolism: Secondary | ICD-10-CM | POA: Insufficient documentation

## 2023-06-24 DIAGNOSIS — Z8249 Family history of ischemic heart disease and other diseases of the circulatory system: Secondary | ICD-10-CM | POA: Insufficient documentation

## 2023-06-24 DIAGNOSIS — I700 Atherosclerosis of aorta: Secondary | ICD-10-CM | POA: Diagnosis not present

## 2023-06-24 DIAGNOSIS — Z7901 Long term (current) use of anticoagulants: Secondary | ICD-10-CM | POA: Diagnosis not present

## 2023-06-24 DIAGNOSIS — R1900 Intra-abdominal and pelvic swelling, mass and lump, unspecified site: Secondary | ICD-10-CM

## 2023-06-24 DIAGNOSIS — I252 Old myocardial infarction: Secondary | ICD-10-CM | POA: Insufficient documentation

## 2023-06-24 DIAGNOSIS — I10 Essential (primary) hypertension: Secondary | ICD-10-CM | POA: Diagnosis not present

## 2023-06-24 DIAGNOSIS — Z8546 Personal history of malignant neoplasm of prostate: Secondary | ICD-10-CM | POA: Diagnosis not present

## 2023-06-24 DIAGNOSIS — R599 Enlarged lymph nodes, unspecified: Secondary | ICD-10-CM | POA: Diagnosis not present

## 2023-06-24 DIAGNOSIS — R1903 Right lower quadrant abdominal swelling, mass and lump: Secondary | ICD-10-CM | POA: Diagnosis present

## 2023-06-24 DIAGNOSIS — K5730 Diverticulosis of large intestine without perforation or abscess without bleeding: Secondary | ICD-10-CM | POA: Insufficient documentation

## 2023-06-24 DIAGNOSIS — I2510 Atherosclerotic heart disease of native coronary artery without angina pectoris: Secondary | ICD-10-CM | POA: Insufficient documentation

## 2023-06-24 DIAGNOSIS — E785 Hyperlipidemia, unspecified: Secondary | ICD-10-CM | POA: Diagnosis not present

## 2023-06-24 LAB — BASIC METABOLIC PANEL
Anion gap: 9 (ref 5–15)
BUN: 19 mg/dL (ref 8–23)
CO2: 24 mmol/L (ref 22–32)
Calcium: 9.2 mg/dL (ref 8.9–10.3)
Chloride: 103 mmol/L (ref 98–111)
Creatinine, Ser: 0.82 mg/dL (ref 0.61–1.24)
GFR, Estimated: >60 mL/min (ref >60)
Glucose, Bld: 94 mg/dL (ref 70–99)
Potassium: 4 mmol/L (ref 3.5–5.1)
Sodium: 136 mmol/L (ref 135–145)

## 2023-06-24 LAB — CBC WITH DIFFERENTIAL/PLATELET
Abs Immature Granulocytes: 0.02 10*3/uL (ref 0.00–0.07)
Basophils Absolute: 0 10*3/uL (ref 0.0–0.1)
Basophils Relative: 1 %
Eosinophils Absolute: 0.2 10*3/uL (ref 0.0–0.5)
Eosinophils Relative: 4 %
HCT: 41.7 % (ref 39.0–52.0)
Hemoglobin: 14 g/dL (ref 13.0–17.0)
Immature Granulocytes: 1 %
Lymphocytes Relative: 10 %
Lymphs Abs: 0.4 10*3/uL — ABNORMAL LOW (ref 0.7–4.0)
MCH: 31.8 pg (ref 26.0–34.0)
MCHC: 33.6 g/dL (ref 30.0–36.0)
MCV: 94.8 fL (ref 80.0–100.0)
Monocytes Absolute: 0.5 10*3/uL (ref 0.1–1.0)
Monocytes Relative: 13 %
Neutro Abs: 2.8 10*3/uL (ref 1.7–7.7)
Neutrophils Relative %: 71 %
Platelets: 191 10*3/uL (ref 150–400)
RBC: 4.4 MIL/uL (ref 4.22–5.81)
RDW: 13 % (ref 11.5–15.5)
WBC: 3.9 10*3/uL — ABNORMAL LOW (ref 4.0–10.5)
nRBC: 0 % (ref 0.0–0.2)

## 2023-06-24 LAB — PROTIME-INR
INR: 1 (ref 0.8–1.2)
Prothrombin Time: 13.7 seconds (ref 11.4–15.2)

## 2023-06-24 LAB — FLOW CYTOMETRY

## 2023-06-24 MED ORDER — FENTANYL CITRATE (PF) 100 MCG/2ML IJ SOLN
INTRAMUSCULAR | Status: AC | PRN
  Administered 2023-06-24 (×2): 50 ug via INTRAVENOUS

## 2023-06-24 MED ORDER — MIDAZOLAM HCL 2 MG/2ML IJ SOLN
INTRAMUSCULAR | Status: AC
  Filled 2023-06-24: qty 2

## 2023-06-24 MED ORDER — FENTANYL CITRATE (PF) 100 MCG/2ML IJ SOLN
INTRAMUSCULAR | Status: AC
  Filled 2023-06-24: qty 2

## 2023-06-24 MED ORDER — LIDOCAINE HCL 1 % IJ SOLN
INTRAMUSCULAR | Status: AC
  Filled 2023-06-24: qty 20

## 2023-06-24 MED ORDER — MIDAZOLAM HCL 2 MG/2ML IJ SOLN
INTRAMUSCULAR | Status: AC | PRN
  Administered 2023-06-24 (×2): 1 mg via INTRAVENOUS

## 2023-06-24 MED ORDER — SODIUM CHLORIDE 0.9 % IV SOLN: INTRAVENOUS | Status: DC

## 2023-06-24 NOTE — Procedures (Signed)
Interventional Radiology Procedure Note  Procedure: Ultrasound guided abdominal mass biopsy  Findings: Please refer to procedural dictation for full description. RLQ mass 18 ga core x 4, samples split between formalin and saline.    Complications: None immediate  Estimated Blood Loss: < 5 mL  Recommendations: Follow up Pathology results.   Marliss Coots, MD

## 2023-06-24 NOTE — Discharge Instructions (Signed)
Discharge Instructions:   Please call Interventional Radiology clinic [phone] with any questions or concerns.  You may remove your bandaid and shower tomorrow.  Moderate Conscious Sedation, Adult, Care After This sheet gives you information about how to care for yourself after your procedure. Your health care provider may also give you more specific instructions. If you have problems or questions, contact your health care provider. What can I expect after the procedure? After the procedure, it is common to have: Sleepiness for several hours. Impaired judgment for several hours. Difficulty with balance. Vomiting if you eat too soon. Follow these instructions at home: For the time period you were told by your health care provider: Rest. Do not participate in activities where you could fall or become injured. Do not drive or use machinery. Do not drink alcohol. Do not take sleeping pills or medicines that cause drowsiness. Do not make important decisions or sign legal documents. Do not take care of children on your own. Eating and drinking  Follow the diet recommended by your health care provider. Drink enough fluid to keep your urine pale yellow. If you vomit: Drink water, juice, or soup when you can drink without vomiting. Make sure you have little or no nausea before eating solid foods. General instructions Take over-the-counter and prescription medicines only as told by your health care provider. Have a responsible adult stay with you for the time you are told. It is important to have someone help care for you until you are awake and alert. Do not smoke. Keep all follow-up visits as told by your health care provider. This is important. Contact a health care provider if: You are still sleepy or having trouble with balance after 24 hours. You feel light-headed. You keep feeling nauseous or you keep vomiting. You develop a rash. You have a fever. You have redness or  swelling around the IV site. Get help right away if: You have trouble breathing. You have new-onset confusion at home. Summary After the procedure, it is common to feel sleepy, have impaired judgment, or feel nauseous if you eat too soon. Rest after you get home. Know the things you should not do after the procedure. Follow the diet recommended by your health care provider and drink enough fluid to keep your urine pale yellow. Get help right away if you have trouble breathing or new-onset confusion at home. This information is not intended to replace advice given to you by your health care provider. Make sure you discuss any questions you have with your health care provider. Document Revised: 04/04/2020 Document Reviewed: 11/01/2019 [employer] Patient Education  2023 [employer]   Needle Biopsy, Care After The following information offers guidance on how to care for yourself after your procedure. Your health care provider may also give you more specific instructions. If you have problems or questions, contact your health care provider. What can I expect after the procedure? After the procedure, it is common to have: Soreness, pain, and tenderness where a tissue sample was taken (biopsy site). Bruising or mild pain at the biopsy site. These symptoms should go away after a few days. Follow these instructions at home: Biopsy site care  Follow instructions from your health care provider about how to take care of your biopsy site. Make sure you: Wash your hands with soap and water for at least 20 seconds before and after you change your bandage (dressing). If soap and water are not available, use hand sanitizer. Know when and how to change   your dressing. Know when to remove your dressing. Check your puncture site every day for signs of infection. Check for: More redness, swelling, or pain. More drainage of fluid or blood. More warmth. Pus or a bad smell. General instructions Rest as  told by your health care provider. Do not take baths, swim, or use a hot tub until your health care provider approves. Ask your health care provider if you may take showers. You may only be allowed to take sponge baths. Take over-the-counter and prescription medicines only as told by your health care provider. Return to your normal activities as told by your health care provider. Ask your health care provider what activities are safe for you. If you have airplane travel scheduled, talk with your health care provider about when it is safe for you to travel by airplane. This is specific to certain biopsy procedures. It is up to you to get the results of your procedure. Ask your health care provider, or the department that is doing the procedure, when your results will be ready. Keep all follow-up visits. You may need to make an appointment to get your biopsy results. Contact a health care provider if: You have a fever. You have more redness, swelling, or pain at the puncture site that lasts longer than a few days. You have more fluid or blood coming from your puncture site. You have pus or a bad smell coming from your puncture site. Your puncture site feels warm to the touch. You have pain that does not get better with medicine. Get help right away if: You have severe bleeding from the puncture site. You have chest pain. You have problems breathing. You cough up blood. You faint. You have a very fast heart rate. These symptoms may be an emergency. Get help right away. Call 911. Do not wait to see if the symptoms will go away. Do not drive yourself to the hospital. Summary After the procedure, it is common to have soreness, bruising, tenderness, or mild pain at the biopsy site. These symptoms should go away in a few days. Check your biopsy site every day for signs of infection, such as more redness, swelling, or pain. Do not take baths, swim, or use a hot tub until your health care provider  approves. Ask your health care provider if you may take showers. Contact a heath care provider if you have more redness, swelling, or pain at the puncture site that lasts longer than a few days. This information is not intended to replace advice given to you by your health care provider. Make sure you discuss any questions you have with your health care provider. Document Revised: 12/02/2021 Document Reviewed: 12/02/2021 [employer] Patient Education  2023 [employer] 

## 2023-06-29 LAB — SURGICAL PATHOLOGY

## 2023-06-30 ENCOUNTER — Inpatient Hospital Stay: Payer: Medicare Other

## 2023-06-30 ENCOUNTER — Inpatient Hospital Stay (HOSPITAL_BASED_OUTPATIENT_CLINIC_OR_DEPARTMENT_OTHER): Payer: Medicare Other | Admitting: Physician Assistant

## 2023-06-30 ENCOUNTER — Other Ambulatory Visit: Payer: Self-pay

## 2023-06-30 VITALS — BP 123/62 | HR 79 | Temp 98.1°F | Resp 18 | Ht 71.0 in | Wt 198.3 lb

## 2023-06-30 DIAGNOSIS — C8513 Unspecified B-cell lymphoma, intra-abdominal lymph nodes: Secondary | ICD-10-CM | POA: Diagnosis not present

## 2023-06-30 DIAGNOSIS — C8333 Diffuse large B-cell lymphoma, intra-abdominal lymph nodes: Secondary | ICD-10-CM | POA: Diagnosis not present

## 2023-06-30 LAB — CMP (CANCER CENTER ONLY)
ALT: 23 U/L (ref 0–44)
AST: 19 U/L (ref 15–41)
Albumin: 3.8 g/dL (ref 3.5–5.0)
Alkaline Phosphatase: 68 U/L (ref 38–126)
Anion gap: 9 (ref 5–15)
BUN: 18 mg/dL (ref 8–23)
CO2: 28 mmol/L (ref 22–32)
Calcium: 10 mg/dL (ref 8.9–10.3)
Chloride: 101 mmol/L (ref 98–111)
Creatinine: 0.86 mg/dL (ref 0.61–1.24)
GFR, Estimated: >60 mL/min (ref >60)
Glucose, Bld: 110 mg/dL — ABNORMAL HIGH (ref 70–99)
Potassium: 4.5 mmol/L (ref 3.5–5.1)
Sodium: 138 mmol/L (ref 135–145)
Total Bilirubin: 0.4 mg/dL (ref 0.3–1.2)
Total Protein: 7 g/dL (ref 6.5–8.1)

## 2023-06-30 LAB — CBC WITH DIFFERENTIAL (CANCER CENTER ONLY)
Abs Immature Granulocytes: 0.01 10*3/uL (ref 0.00–0.07)
Basophils Absolute: 0 10*3/uL (ref 0.0–0.1)
Basophils Relative: 1 %
Eosinophils Absolute: 0.2 10*3/uL (ref 0.0–0.5)
Eosinophils Relative: 5 %
HCT: 39 % (ref 39.0–52.0)
Hemoglobin: 13.3 g/dL (ref 13.0–17.0)
Immature Granulocytes: 0 %
Lymphocytes Relative: 9 %
Lymphs Abs: 0.4 10*3/uL — ABNORMAL LOW (ref 0.7–4.0)
MCH: 32.4 pg (ref 26.0–34.0)
MCHC: 34.1 g/dL (ref 30.0–36.0)
MCV: 94.9 fL (ref 80.0–100.0)
Monocytes Absolute: 0.7 10*3/uL (ref 0.1–1.0)
Monocytes Relative: 14 %
Neutro Abs: 3.4 10*3/uL (ref 1.7–7.7)
Neutrophils Relative %: 71 %
Platelet Count: 191 10*3/uL (ref 150–400)
RBC: 4.11 MIL/uL — ABNORMAL LOW (ref 4.22–5.81)
RDW: 13.2 % (ref 11.5–15.5)
WBC Count: 4.7 10*3/uL (ref 4.0–10.5)
nRBC: 0 % (ref 0.0–0.2)

## 2023-06-30 LAB — PHOSPHORUS: Phosphorus: 3.8 mg/dL (ref 2.5–4.6)

## 2023-06-30 LAB — LACTATE DEHYDROGENASE: LDH: 156 U/L (ref 98–192)

## 2023-06-30 LAB — URIC ACID: Uric Acid, Serum: 6.9 mg/dL (ref 3.7–8.6)

## 2023-06-30 MED ORDER — ENOXAPARIN SODIUM 100 MG/ML IJ SOSY: 90.0000 mg | PREFILLED_SYRINGE | Freq: Two times a day (BID) | INTRAMUSCULAR | 0 refills | Status: DC

## 2023-06-30 NOTE — Progress Notes (Signed)
[facility] [phone]   [phone]  PROGRESS NOTE:  Patient Care Team: Orpha Bur, MD as PCP - General (Family Medicine) Quintella Reichert, MD as PCP - Cardiology (Cardiology) Crist Fat, MD as Attending Physician (Urology) Margaretmary Dys, MD as Consulting Physician (Radiation Oncology) Maryclare Labrador, RN as Registered Nurse  CHIEF COMPLAINTS/PURPOSE OF CONSULTATION:  High grade B-cell lymphoma  ONCOLOGIC HISTORY: Presented to PCP with swelling and redness in his right leg x 3 weeks. 06/13/2023: Doppler US showed acute DVT involving right  common femoral vein, SF junction, right femoral vein, right proximal profunda vein, and external iliac vein. Incident finding included pelvic lesion with minimum vascularity anterior to the prostate, measuring 11.8 x 9.6 12.6 cm, with a volume of 749.3 cubic centimeters. Prior MR on 04/11/2021 reports 1.7 x .07 x 1.1 cm category 4 lesion. This lesion appears to be causing extrinsic compression on the external iliac vein (proximal external iliac vein not visualized due to extrinsic compression).  06/16/2023: CT angio abdomen/pelvis: Large lobulated mass in the right pelvis encasing the right iliac vessels and abutting the anterior pelvic wall as well as right iliacus muscle and right bladder dome. This mass consistent with a neoplastic process and features most suggestive of lymphoma.Multiple splenic lesions most consistent with metastatic disease.Short segment thick wall small bowel in the mid abdomen to the right of midline most consistent with infiltrative malignancy/lymphoma. Right pelvic sidewall mass/adenopathy.Cholelithiasis. Colonic diverticulosis. Aortic Atherosclerosis. 06/21/2023: Establish care with [facility].  06/24/2023: US guided biopsy of abdominal mass. Pathology is consistent with B-cell lymphoma with a high proliferation rate of 80-90%. 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   HISTORY OF PRESENTING ILLNESS:  Eugene Moreno 77 y.o. male returns for a follow up for newly diagnosed high grade B-cell lymphoma. He is accompanied by his wife for this visit.   On exam today, Eugene Moreno reports some improvement of the swelling in his right leg. He still has some discomfort in his right groin area. He is compliant with taking Eliquis without any bleeding episodes. His appetite and energy levels are stable. He denies any nausea, vomiting or abdominal pain. His bowel habits are unchanged without recurrent episodes of diarrhea or constipation. He denies easy bruising or signs of bleeding. He denies any fevers, chills,sweats, shortness of breath, chest pain or cough. He has no other complaints. Rest of the ROS is below.   MEDICAL HISTORY:  Past Medical History:  Diagnosis Date   Coronary artery disease    cardiologist--- dr t. turner;  hx AWMI 09-06-2001  s/p cath with PCI and DES x1 to prox LAD;  last nuclear study 05-10-2016 in epic, anterior scar without ishcemia, nuclear ef 44%)   History of MI (myocardial infarction) 09/06/2001   anterior wall   Hyperlipemia    Hypertension    Leukopenia    chronic mild   Prostate cancer [facility])    urologist-- dr Marlou Porch--- dx 05/ 2022, Gleason 4+3, PSA 8.04   S/P drug eluting coronary stent placement 09/06/2001   x1 to prox LAD   Wears glasses    Wears hearing aid in both ears     SURGICAL HISTORY: Past Surgical History:  Procedure Laterality Date   ANTERIOR CERVICAL DECOMP/DISCECTOMY FUSION  01/26/2002   @[facility]  by dr Jeral Moreno;   C6-C7   COLONOSCOPY  2020  CORONARY ANGIOPLASTY WITH STENT PLACEMENT  09/06/2001   @[facility]  by dr s. 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   SPACE OAR INSTILLATION N/A 09/03/2021   Procedure: SPACE OAR INSTILLATION;  Surgeon: Crist Fat, MD;  Location: [facility];  Service: Urology;  Laterality: N/A;   WISDOM TOOTH EXTRACTION      SOCIAL HISTORY: Social History   Socioeconomic History   Marital status: Married    Spouse name: Not on file   Number of children: Not on file   Years of education: Not on file   Highest education level: Not on file  Occupational History   Occupation: retired    Associate Professor: [employer]    Comment: Systems analyst  Tobacco Use   Smoking status: Former    Current packs/day: 0.00    Types: Cigarettes    Start date: 1965    Quit date: 1969    Years since quitting: 55.5   Smokeless tobacco: Never  Vaping Use   Vaping status: Never Used  Substance and Sexual Activity   Alcohol use: Yes    Comment: occasional   Drug use: Never   Sexual activity: Yes    Comment: vasectomy  Other Topics Concern   Not on file  Social History Narrative   Not on file   Social Determinants of Health   Financial Resource Strain: Not on file  Food Insecurity: No Food Insecurity (06/21/2023)   Hunger Vital Sign    Worried About Running Out of Food in the Last Year: Never true    Ran Out of Food in the Last Year: Never true  Transportation Needs: No Transportation Needs (06/21/2023)   PRAPARE - Administrator, Civil Service (Medical): No    Lack of Transportation (Non-Medical): No  Physical Activity: Not on file  Stress: Not on file  Social Connections: Not on file  Intimate Partner Violence: Not At Risk (06/21/2023)   Humiliation, Afraid, Rape, and Kick questionnaire    Fear of Current or Ex-Partner: No    Emotionally Abused: No    Physically Abused: No    Sexually Abused: No    FAMILY HISTORY: Family  History  Problem Relation Age of Onset   Breast cancer Mother 26   Heart attack Father        x3 between ages 12-64   Other Sister 6       angioplasty    ALLERGIES:  is allergic to niacin and related.  MEDICATIONS:  Current Outpatient Medications  Medication Sig Dispense Refill   apixaban (ELIQUIS) 5 MG TABS tablet Take 1 tablet (5 mg total) by mouth 2 (two) times daily. Start taking after completion of starter pack. 60 tablet 5   APIXABAN (ELIQUIS) VTE STARTER PACK (10MG  AND 5MG ) Take as directed on package: start with two-5mg  tablets twice daily for 7 days. On day 8, switch to one-5mg  tablet twice daily. 74 each 0   aspirin EC 81 MG tablet Take 2 tablets (162 mg total) by mouth daily.     Cetirizine HCl (ZYRTEC ALLERGY) 10 MG CAPS Take 1 capsule by mouth daily.     Cod Liver Oil CAPS Take 2 capsules by  mouth daily.     ferrous fumarate (HEMOCYTE - 106 MG FE) 325 (106 FE) MG TABS Take 1 tablet by mouth daily.     fluticasone (FLONASE) 50 MCG/ACT nasal spray Place 1 spray into both nostrils 2 (two) times daily.     folic acid (FOLVITE) 800 MCG tablet Take 800 mcg by mouth daily.      icosapent Ethyl (VASCEPA) 1 g capsule Take 2 capsules (2 g total) by mouth 2 (two) times daily. 120 capsule 8   ramipril (ALTACE) 2.5 MG capsule TAKE 1 CAPSULE BY MOUTH EVERY DAY 90 capsule 3   rosuvastatin (CRESTOR) 10 MG tablet TAKE 1 TABLET BY MOUTH EVERY DAY 90 tablet 3   vitamin C (ASCORBIC ACID) 500 MG tablet Take 1,000 mg by mouth daily.      vitamin E 400 UNIT capsule Take 400 Units by mouth daily.     enoxaparin (LOVENOX) 100 MG/ML injection Inject 0.9 mLs (90 mg total) into the skin every 12 (twelve) hours for 2 days. Hold injection 12 hours before port placement 3.6 mL 0   No current facility-administered medications for this visit.    REVIEW OF SYSTEMS:   Constitutional: ( - ) fevers, ( - )  chills , ( - ) night sweats Eyes: ( - ) blurriness of vision, ( - ) double vision, ( - ) watery  eyes Ears, nose, mouth, throat, and face: ( - ) mucositis, ( - ) sore throat Respiratory: ( - ) cough, ( - ) dyspnea, ( - ) wheezes Cardiovascular: ( - ) palpitation, ( - ) chest discomfort, ( + ) lower extremity swelling Gastrointestinal:  ( - ) nausea, ( - ) heartburn, ( - ) change in bowel habits Skin: ( - ) abnormal skin rashes Lymphatics: ( - ) new lymphadenopathy, ( - ) easy bruising Neurological: ( - ) numbness, ( - ) tingling, ( - ) new weaknesses Behavioral/Psych: ( - ) mood change, ( - ) new changes  All other systems were reviewed with the patient and are negative.  PHYSICAL EXAMINATION: ECOG PERFORMANCE STATUS: 1 - Symptomatic but completely ambulatory  Vitals:   06/30/23 1132  BP: 123/62  Pulse: 79  Resp: 18  Temp: 98.1 F (36.7 C)  SpO2: 97%   Filed Weights   06/30/23 1132  Weight: 198 lb 4.8 oz (89.9 kg)    GENERAL: well appearing male in NAD  SKIN: skin color, texture, turgor are normal, no rashes or significant lesions EYES: conjunctiva are pink and non-injected, sclera clear LUNGS: clear to auscultation and percussion with normal breathing effort HEART: regular rate & rhythm and no murmurs. Right lower extremity edema, wearing compression stocking Musculoskeletal: no cyanosis of digits and no clubbing  PSYCH: alert & oriented x 3, fluent speech NEURO: no focal motor/sensory deficits  LABORATORY DATA:  I have reviewed the data as listed    Latest Ref Rng & Units 06/24/2023   12:30 PM 06/21/2023   12:07 PM 08/31/2021   10:28 AM  CBC  WBC 4.0 - 10.5 K/uL 3.9  4.3  3.7   Hemoglobin 13.0 - 17.0 g/dL 16.1  09.6  04.5   Hematocrit 39.0 - 52.0 % 41.7  42.4  44.6   Platelets 150 - 400 K/uL 191  213  175        Latest Ref Rng & Units 06/24/2023   12:30 PM 06/21/2023   12:07 PM 05/03/2023    8:30 AM  CMP  Glucose 70 - 99  mg/dL 94  90    BUN 8 - 23 mg/dL 19  15    Creatinine 1.61 - 1.24 mg/dL 0.96  0.45    Sodium 409 - 145 mmol/L 136  139    Potassium 3.5 -  5.1 mmol/L 4.0  4.5    Chloride 98 - 111 mmol/L 103  102    CO2 22 - 32 mmol/L 24  30    Calcium 8.9 - 10.3 mg/dL 9.2  81.1    Total Protein 6.5 - 8.1 g/dL  6.9  7.7   Total Bilirubin 0.3 - 1.2 mg/dL  0.5  0.7   Alkaline Phos 38 - 126 U/L  66  90   AST 15 - 41 U/L  18  28   ALT 0 - 44 U/L  16  37      RADIOGRAPHIC STUDIES: I have personally reviewed the radiological images as listed and agreed with the findings in the report. US BIOPSY (ABDOMINAL RETROPERTIONEAL MASS)  Result Date: 06/24/2023 INDICATION: 77 year old male with large right lower quadrant abdominal mass. EXAM: Ultrasound-guided abdominal mass biopsy MEDICATIONS: None. ANESTHESIA/SEDATION: Moderate (conscious) sedation was employed during this procedure. A total of Versed 2 mg and Fentanyl 100 mcg was administered intravenously by the radiology nurse. Total intra-service moderate Sedation Time: 10 minutes. The patient's level of consciousness and vital signs were monitored continuously by radiology nursing throughout the procedure under my direct supervision. COMPLICATIONS: None immediate. PROCEDURE: Informed written consent was obtained from the patient after a thorough discussion of the procedural risks, benefits and alternatives. All questions were addressed. Maximal Sterile Barrier Technique was utilized including caps, mask, sterile gowns, sterile gloves, sterile drape, hand hygiene and skin antiseptic. A timeout was performed prior to the initiation of the procedure. Preprocedure ultrasound evaluation demonstrated large, heterogeneously hypoechoic mass in the right inguinal and iliac region. The procedure was planned. Subdermal Local anesthesia was provided at the planned needle entry site with 1% lidocaine. Deeper local anesthetic was administered to the periphery of the mass under ultrasound visualization. A small skin nick was made. Under direct ultrasound visualization, a 17 gauge coaxial introducer needle was inserted to the  periphery of the mass. A total of 4, 18 gauge core biopsies were obtained in the samples were split between formalin and saline. The needle was removed. Hemostasis was achieved with brief manual compression. Postprocedure ultrasound evaluation demonstrated no evidence of surrounding hematoma or other complicating features. The patient tolerated the procedure well and was transferred to the recovery area in good condition. IMPRESSION: Technically successful core biopsy of right lower quadrant abdominal mass. Marliss Coots, MD [facility] Radiology Electronically Signed   By: Marliss Coots M.D.   On: 06/24/2023 16:23   CT ANGIO ABDOMEN PELVIS  W & WO CONTRAST  Result Date: 06/16/2023 CLINICAL DATA:  Iliac artery dissection. EXAM: CTA ABDOMEN AND PELVIS WITHOUT AND WITH CONTRAST TECHNIQUE: Multidetector CT imaging of the abdomen and pelvis was performed using the standard protocol during bolus administration of intravenous contrast. Multiplanar reconstructed images and MIPs were obtained and reviewed to evaluate the vascular anatomy. RADIATION DOSE REDUCTION: This exam was performed according to the departmental dose-optimization program which includes automated exposure control, adjustment of the mA and/or kV according to patient size and/or use of iterative reconstruction technique. CONTRAST:  OMNIPAQUE IOHEXOL 350 MG/ML SOLN COMPARISON:  None Available. FINDINGS: VASCULAR Aorta: Mild atherosclerotic calcification of the abdominal aorta. No aneurysmal dilatation or dissection. No periaortic fluid collection. Celiac:  Patent without evidence of aneurysm, dissection, vasculitis or significant stenosis. SMA: Patent without evidence of aneurysm, dissection, vasculitis or significant stenosis. Renals: Both renal arteries are patent without evidence of aneurysm, dissection, vasculitis, fibromuscular dysplasia or significant stenosis. IMA: Patent without evidence of  aneurysm, dissection, vasculitis or significant stenosis. Inflow: Mild atherosclerotic calcification. No aneurysmal dilatation or dissection. The iliac arteries are patent. Proximal Outflow: The visualized proximal outflow is patent. Veins: The IVC is unremarkable. The SMV, splenic vein, and main portal vein are patent. No portal venous gas. Review of the MIP images confirms the above findings. NON-VASCULAR Lower chest: The visualized lung bases are clear. There is coronary vascular calcification. No intra-abdominal free air or free fluid. Hepatobiliary: The liver is unremarkable. No biliary dilatation. The gallbladder is partially contracted. Several small stones noted in the gallbladder. No pericholecystic fluid or evidence of acute cholecystitis by CT. Pancreas: Unremarkable. No pancreatic ductal dilatation or surrounding inflammatory changes. Spleen: Multiple hypoenhancing splenic lesions most consistent with metastatic disease. These measure up to approximately 3.2 cm. Adrenals/Urinary Tract: The adrenal glands are unremarkable. There is no hydronephrosis on either side. Small left renal cysts. The visualized ureters appear unremarkable. The urinary bladder is minimally distended. There is compression of the right bladder dome by the pelvic mass. Stomach/Bowel: There is sigmoid diverticulosis and scattered colonic diverticula without active inflammatory changes. There is moderate stool throughout the colon. There is dilatation of a short segment thick wall small bowel in the mid abdomen to the right of midline (axial 35/11 and coronal 54/13) most consistent with infiltrative malignancy/lymphoma. There is no bowel obstruction or active inflammation. The appendix is normal. Lymphatic: Right pelvic sidewall mass/adenopathy measures 3.3 x 2.9 cm (68/11). Reproductive: Prostate brachytherapy seeds. Other: There is a 10 x 13 cm lobulated soft tissue mass in the right pelvis abutting the right iliacus muscle and  encasing the right iliac vessels. This mass demonstrates areas of decreased enhancement centrally suggestive of necrotic tissue. The mass abuts the right bladder dome with loss of fat plane concerning for infiltration into the bladder wall. Additionally this mass abuts the anterior peritoneal wall and right rectus sheath with loss of fat plane. There is also extension of the mass into the right inguinal canal. Musculoskeletal: No acute osseous pathology. IMPRESSION: 1. No acute vascular pathology. 2. Large lobulated mass in the right pelvis encasing the right iliac vessels and abutting the anterior pelvic wall as well as right iliacus muscle and right bladder dome. This mass consistent with a neoplastic process and features most suggestive of lymphoma. Clinical correlation recommended. 3. Multiple splenic lesions most consistent with metastatic disease. 4. Short segment thick wall small bowel in the mid abdomen to the right of midline most consistent with infiltrative malignancy/lymphoma. 5. Right pelvic sidewall mass/adenopathy. 6. Cholelithiasis. 7. Colonic diverticulosis. 8.  Aortic Atherosclerosis (ICD10-I70.0). Electronically Signed   By: Elgie Collard M.D.   On: 06/16/2023 16:06   VAS [place] LOWER EXTREMITY VENOUS (DVT)  Result Date: 06/13/2023  Lower Venous DVT Study Patient Name:  Eugene Moreno  Date of Exam:   06/13/2023 Medical Rec #: [id number]          Accession #:    [id number] Date of Birth: 1946/08/11          Patient Gender: M Patient Age:   89 years Exam Location:  [facility] Procedure:      VAS [place] LOWER EXTREMITY VENOUS (DVT) Referring Phys: Clayburn Pert MADDEN --------------------------------------------------------------------------------  Indications: Patient reports increase right lower extremity swelling  after feeling a "pop" about 3 weeks ago. He denies SOB.  Risk Factors: None identified. Comparison Study: NA Performing Technologist: Tyna Jaksch RVT  Examination Guidelines: A complete  evaluation includes B-mode imaging, spectral Doppler, color Doppler, and power Doppler as needed of all accessible portions of each vessel. Bilateral testing is considered an integral part of a complete examination. Limited examinations for reoccurring indications may be performed as noted. The reflux portion of the exam is performed with the patient in reverse Trendelenburg.  +---------+---------------+---------+-----------+----------------+-------------+ RIGHT    CompressibilityPhasicitySpontaneityProperties      Thrombus                                                                  Aging         +---------+---------------+---------+-----------+----------------+-------------+ CFV      Full           No       No         spongy          Acute                                                     w/compression                 +---------+---------------+---------+-----------+----------------+-------------+ SFJ      Full           No       No         spongy          Acute                                                     w/compression                 +---------+---------------+---------+-----------+----------------+-------------+ FV Prox  Full           No       No         spongy          Acute                                                     w/compression                 +---------+---------------+---------+-----------+----------------+-------------+ FV Mid   Full           Yes      Yes                                      +---------+---------------+---------+-----------+----------------+-------------+ FV DistalFull           Yes      Yes                                      +---------+---------------+---------+-----------+----------------+-------------+  PFV      Full           No       No         spongy          Acute                                                     w/compression                  +---------+---------------+---------+-----------+----------------+-------------+ POP      Full           Yes      Yes                                      +---------+---------------+---------+-----------+----------------+-------------+ PTV      Full                                                             +---------+---------------+---------+-----------+----------------+-------------+ PERO     Full                                                             +---------+---------------+---------+-----------+----------------+-------------+ Gastroc  Full                                                             +---------+---------------+---------+-----------+----------------+-------------+ GSV      Full           Yes      No         spongy          Acute                                                     w/compression                 +---------+---------------+---------+-----------+----------------+-------------+ EIV                     No       No         softly echogenicAcute         +---------+---------------+---------+-----------+----------------+-------------+   Right Technical Findings: Acute occlusive thrombus in the mid and distal external iliac vein CFV and SFJ. Retrograde flow in the GSV at proximal thigh level. Acute non-occlusive thrombus in the proximal femoral vein and deep femoral vein. Rouleau flow noted in the popliteal vein. There is an inhomogeneous lesion with minimum vascularity anterior to the prostate, measuring 11.8 x 9.6  12.6 cm, with a volume of 749.3 cubic centimeters. Prior MR on 04/11/2021 reports 1.7 x .07 x 1.1 cm category 4 lesion. This lesion appears to be causing extrinsic compression on the external iliac vein (proximal external iliac vein not visualized due to extrinsic compression).  +----+---------------+---------+-----------+----------+--------------+ LEFTCompressibilityPhasicitySpontaneityPropertiesThrombus Aging  +----+---------------+---------+-----------+----------+--------------+ CFV Full           Yes      Yes                                 +----+---------------+---------+-----------+----------+--------------+    Findings reported to Dr. Rubye Oaks, and Tamela Oddi at [facility] at 12:10 pm.  Summary: RIGHT: - Findings consistent with acute deep vein thrombosis involving the right common femoral vein, SF junction, right femoral vein, right proximal profunda vein, and external iliac vein. - No cystic structure found in the popliteal fossa.  LEFT: - No evidence of common femoral vein obstruction.  *See table(s) above for measurements and observations. Electronically signed by Coral Else MD on 06/13/2023 at 3:50:16 PM.    Final     ASSESSMENT & PLAN Eugene Moreno is a 77 y.o. male who presents to the diagnostic clinic for evaluation of pelvic mass seen on CT imaging from 06/16/2023.   #High grade B-cell lymphoma --Underwent US guided biopsy of pelvic mass on 06/24/2023.  Pathology is consistent with B-cell lymphoma with a high proliferation rate of 80-90%. Morphologically the findings are most consistent with a diffuse large B-cell lymphoma but awaiting FISH studies to evaluate for Burkitt's lymphoma or double hit lymphoma. --Discussed if final diagnosis is Burkitt's lymphoma or double hit lymphoma, we will recommend referral to [facility] as he will likely need inpatient chemotherapy. If patient has diffuse large B cell lymphoma, we will plan to treat in the outpatient setting with [facility].  --While we are awaiting for molecular testing to finalize diagnosis, recommend staging PET/CT scan, baseline echocardiogram and port placement in anticipation for chemotherapy. --Labs today to repeat CBC, CMP, LDH, phosphorus, uric acid levels.  --RTC once remaining diagnostic studies are finalized.   #Small bowel thickening: --Etiology unknown but could be secondary to lymphoma ---Consider  referral to GI for endoscopic evaluation if patient becomes symptomatic.   #Right lower extremity DVT: --Likely secondary to underlying malignancy --Currently on Eliquis therapy. Recommend to hold 48 hours before upcoming port placement and bridge with Lovenox therapy. Hold Lovenox 12 hours before biopsy. Sent prescription for Lovenox injections.   Prostate cancer: --Treated with brachytherapy/seed implantation in 2022 with Dr. Kathrynn Running  Orders Placed This Encounter  Procedures   NM PET Image Initial (PI) Skull Base To Thigh    Standing Status:   Future    Standing Expiration Date:   06/29/2024    Order Specific Question:   If indicated for the ordered procedure, I authorize the administration of a radiopharmaceutical per Radiology protocol    Answer:   Yes    Order Specific Question:   Preferred imaging location?    Answer:   [place]   IR IMAGING GUIDED PORT INSERTION    Standing Status:   Future    Standing Expiration Date:   06/29/2024    Order Specific Question:   Reason for Exam (SYMPTOM  OR DIAGNOSIS REQUIRED)    Answer:   port for chemo    Order Specific Question:   Preferred Imaging Location?    Answer:   [facility]  CBC with Differential (Cancer Center Only)    Standing Status:   Future    Number of Occurrences:   1    Standing Expiration Date:   06/29/2024   CMP (Cancer Center only)    Standing Status:   Future    Number of Occurrences:   1    Standing Expiration Date:   06/29/2024   Lactate dehydrogenase (LDH)    Standing Status:   Future    Number of Occurrences:   1    Standing Expiration Date:   06/29/2024   Uric acid    Standing Status:   Future    Number of Occurrences:   1    Standing Expiration Date:   06/29/2024   Phosphorus    Standing Status:   Future    Number of Occurrences:   1    Standing Expiration Date:   06/29/2024   ECHOCARDIOGRAM COMPLETE    Standing Status:   Future    Standing Expiration Date:   06/29/2024    Order Specific  Question:   Where should this test be performed    Answer:   [facility]    Order Specific Question:   Perflutren DEFINITY (image enhancing agent) should be administered unless hypersensitivity or allergy exist    Answer:   Administer Perflutren    Order Specific Question:   Reason for exam-Echo    Answer:   Chemo  Z09    All questions were answered. The patient knows to call the clinic with any problems, questions or concerns.  I have spent a total of 30 minutes minutes of face-to-face and non-face-to-face time, preparing to see the patient, , performing a medically appropriate examination, counseling and educating the patient, ordering tests/procedures,  documenting clinical information in the electronic health record, and care coordination.   Georga Kaufmann, PA-C Department of Hematology/Oncology [facility] at [facility] Phone: [phone]  Patient was seen with Dr. Leonides Schanz.  I have read the above note and personally examined the patient. I agree with the assessment and plan as noted above.  Briefly Eugene Moreno is a 77 year old male with newly diagnosed aggressive lymphoma.  Testing still underway to determine if this is a diffuse large B-cell lymphoma, double hit lymphoma, or Burkitt's lymphoma.  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  We are currently undergoing further workup to include PET CT scan, port placement, and echocardiogram.  In the meantime we are also waiting for the final results of his B-cell FISH panel to come back to determine what type of lymphoma this is.  If he requires inpatient treatment we will plan to have the patient transferred to [facility].  If it is a diffuse large B-cell lymphoma without double hit/triple hit we will plan to proceed with outpatient chemotherapy here.   Ulysees Barns, MD Department of Hematology/Oncology [facility] at [facility] Phone: [phone] Pager: [phone] Email: [email or web address]

## 2023-07-02 ENCOUNTER — Other Ambulatory Visit: Payer: Self-pay

## 2023-07-02 DIAGNOSIS — I82421 Acute embolism and thrombosis of right iliac vein: Secondary | ICD-10-CM

## 2023-07-04 ENCOUNTER — Ambulatory Visit (HOSPITAL_COMMUNITY): Payer: Medicare Other | Attending: Cardiology

## 2023-07-04 DIAGNOSIS — Z0189 Encounter for other specified special examinations: Secondary | ICD-10-CM | POA: Insufficient documentation

## 2023-07-04 DIAGNOSIS — I5030 Unspecified diastolic (congestive) heart failure: Secondary | ICD-10-CM

## 2023-07-04 DIAGNOSIS — C8513 Unspecified B-cell lymphoma, intra-abdominal lymph nodes: Secondary | ICD-10-CM | POA: Insufficient documentation

## 2023-07-04 LAB — ECHOCARDIOGRAM COMPLETE
Area-P 1/2: 2.44 cm2
S' Lateral: 2.4 cm

## 2023-07-06 ENCOUNTER — Ambulatory Visit
Admission: RE | Admit: 2023-07-06 | Discharge: 2023-07-06 | Disposition: A | Discharge status: Home / Self Care | Payer: Medicare Other | Source: Ambulatory Visit | Attending: Physician Assistant | Admitting: Physician Assistant

## 2023-07-06 DIAGNOSIS — C8513 Unspecified B-cell lymphoma, intra-abdominal lymph nodes: Secondary | ICD-10-CM | POA: Insufficient documentation

## 2023-07-06 DIAGNOSIS — R600 Localized edema: Secondary | ICD-10-CM | POA: Diagnosis not present

## 2023-07-06 DIAGNOSIS — I700 Atherosclerosis of aorta: Secondary | ICD-10-CM | POA: Insufficient documentation

## 2023-07-06 DIAGNOSIS — I2510 Atherosclerotic heart disease of native coronary artery without angina pectoris: Secondary | ICD-10-CM | POA: Insufficient documentation

## 2023-07-06 LAB — GLUCOSE, CAPILLARY: Glucose-Capillary: 83 mg/dL (ref 70–99)

## 2023-07-06 MED ORDER — FLUDEOXYGLUCOSE F - 18 (FDG) INJECTION
10.3000 | Freq: Once | INTRAVENOUS | Status: AC | PRN
  Administered 2023-07-06: 10.89 via INTRAVENOUS

## 2023-07-07 ENCOUNTER — Other Ambulatory Visit: Payer: Self-pay | Admitting: Radiology

## 2023-07-07 ENCOUNTER — Telehealth: Payer: Self-pay | Admitting: Physician Assistant

## 2023-07-07 ENCOUNTER — Encounter (HOSPITAL_COMMUNITY): Payer: Self-pay | Admitting: Hematology and Oncology

## 2023-07-07 ENCOUNTER — Telehealth: Payer: Self-pay

## 2023-07-07 ENCOUNTER — Other Ambulatory Visit: Payer: Self-pay | Admitting: Hematology and Oncology

## 2023-07-07 ENCOUNTER — Encounter: Payer: Self-pay | Admitting: Hematology and Oncology

## 2023-07-07 DIAGNOSIS — C8330 Diffuse large B-cell lymphoma, unspecified site: Secondary | ICD-10-CM | POA: Insufficient documentation

## 2023-07-07 DIAGNOSIS — C8338 Diffuse large B-cell lymphoma, lymph nodes of multiple sites: Secondary | ICD-10-CM

## 2023-07-07 MED ORDER — PREDNISONE 20 MG PO TABS: 60.0000 mg | ORAL_TABLET | Freq: Every day | ORAL | 5 refills | Status: DC

## 2023-07-07 MED ORDER — ALLOPURINOL 300 MG PO TABS: 300.0000 mg | ORAL_TABLET | Freq: Every day | ORAL | 1 refills | Status: DC

## 2023-07-07 MED ORDER — ONDANSETRON HCL 8 MG PO TABS: 8.0000 mg | ORAL_TABLET | Freq: Three times a day (TID) | ORAL | 0 refills | Status: DC | PRN

## 2023-07-07 MED ORDER — LIDOCAINE-PRILOCAINE 2.5-2.5 % EX CREA: 1.0000 | TOPICAL_CREAM | CUTANEOUS | 0 refills | Status: AC | PRN

## 2023-07-07 MED ORDER — PROCHLORPERAZINE MALEATE 10 MG PO TABS: 10.0000 mg | ORAL_TABLET | Freq: Four times a day (QID) | ORAL | 0 refills | Status: DC | PRN

## 2023-07-07 NOTE — Progress Notes (Signed)
START ON PATHWAY REGIMEN - Lymphoma and CLL     A cycle is every 21 days:     Prednisone      Rituximab-xxxx      Cyclophosphamide      Doxorubicin      Vincristine   **Always confirm dose/schedule in your pharmacy ordering system**  Patient Characteristics: Diffuse Large B-Cell Lymphoma or Follicular Lymphoma, Grade 3B, First Line, Stage III and IV Disease Type: Not Applicable Disease Type: Diffuse Large B-Cell Lymphoma Disease Type: Not Applicable Line of therapy: First Line Intent of Therapy: Curative Intent, Discussed with Patient 

## 2023-07-07 NOTE — Telephone Encounter (Signed)
Instructed Mr. Isola  to pick up medications sent to his pharmacy and he can bring them to class tomorrow. Told him not to begin the Prednisone . Told him that is to start on Day 1 of his chemo and take daily for 5 days.  The Edu. Nurse Lendell Caprice will review this with him. Pt verbalized understanding.

## 2023-07-07 NOTE — Telephone Encounter (Signed)
Patient is aware of upcoming appointment dates/times.  

## 2023-07-08 ENCOUNTER — Inpatient Hospital Stay: Payer: Medicare Other

## 2023-07-08 ENCOUNTER — Other Ambulatory Visit: Payer: Self-pay

## 2023-07-08 NOTE — H&P (Signed)
Referring Physician(s): Dorsey,J  Supervising Physician: Roanna Banning  Patient Status:  [facility] OP  Chief Complaint: "I'm getting a port a cath"   Subjective: Pt known to IR team from RLQ abd mass biopsy on 06/24/23. He is a 77 y.o. male with PMH sig for CAD/MI/stenting, HLD,HTN, chronic leukopenia, prostate cancer 2022 , recently diagnosed RLE DVT(on eliquis) as well as newly diagnosed high grade B cell lymphoma. He is scheduled today for port a cath placement to assist with treatment.    Past Medical History:  Diagnosis Date   Coronary artery disease    cardiologist--- dr t. turner;  hx AWMI 09-06-2001  s/p cath with PCI and DES x1 to prox LAD;  last nuclear study 05-10-2016 in epic, anterior scar without ishcemia, nuclear ef 44%)   History of MI (myocardial infarction) 09/06/2001   anterior wall   Hyperlipemia    Hypertension    Leukopenia    chronic mild   Prostate cancer [facility])    urologist-- dr Marlou Porch--- dx 05/ 2022, Gleason 4+3, PSA 8.04   S/P drug eluting coronary stent placement 09/06/2001   x1 to prox LAD   Wears glasses    Wears hearing aid in both ears    Past Surgical History:  Procedure Laterality Date   ANTERIOR CERVICAL DECOMP/DISCECTOMY FUSION  01/26/2002   @[facility]  by dr Jeral Fruit;   C6-C7   COLONOSCOPY  2020   CORONARY ANGIOPLASTY WITH STENT PLACEMENT  09/06/2001   @[facility]  by dr s. 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 Authorizing Provider  allopurinol (ZYLOPRIM) 300 MG tablet Take 1 tablet (300 mg total) by mouth daily. 07/07/23   Jaci Standard, MD  apixaban (ELIQUIS) 5 MG TABS tablet Take 1 tablet (5 mg total) by mouth 2 (two) times daily. Start taking after completion of starter pack. 06/13/23   Pervis Hocking B, RPH-CPP  APIXABAN Everlene Balls) VTE STARTER PACK (10MG  AND 5MG ) Take as directed on package: start with two-5mg  tablets twice daily for 7 days. On day 8, switch to one-5mg  tablet twice daily. 06/13/23   Pervis Hocking B, RPH-CPP  aspirin EC 81 MG tablet Take 2 tablets (162 mg total) by mouth daily. 10/18/11   Laurey Morale, MD  Cetirizine HCl (ZYRTEC ALLERGY) 10 MG CAPS Take 1 capsule by mouth daily.    [provider]  [facility] Liver Oil CAPS Take 2 capsules by mouth daily.    [provider]  enoxaparin (LOVENOX) 100 MG/ML injection Inject 0.9 mLs (90 mg total) into the skin every 12 (twelve) hours for 2 days. Hold injection 12 hours before port placement 06/30/23 07/02/23  Georga Kaufmann T, PA-C  ferrous fumarate (HEMOCYTE - 106 MG FE) 325 (106 FE) MG TABS Take 1 tablet by mouth daily.    [provider]  fluticasone (FLONASE) 50 MCG/ACT  nasal spray Place 1 spray into both nostrils 2 (two) times daily. 04/07/23   [provider]  folic acid (FOLVITE) 800 MCG tablet Take 800 mcg by mouth daily.     [provider]  icosapent Ethyl (VASCEPA) 1 g capsule Take 2 capsules (2 g total) by mouth 2 (two) times daily. 04/26/23   Sharlene Dory, PA-C  lidocaine-prilocaine (EMLA) cream Apply 1 Application topically as needed. 07/07/23   Jaci Standard, MD  ondansetron (ZOFRAN) 8 MG tablet Take 1 tablet (8 mg total) by mouth every 8 (eight) hours as needed. 07/07/23   Jaci Standard, MD  predniSONE (DELTASONE) 20 MG tablet Take 3 tablets (60 mg total) by mouth daily with  breakfast. 07/07/23   Jaci Standard, MD  prochlorperazine (COMPAZINE) 10 MG tablet Take 1 tablet (10 mg total) by mouth every 6 (six) hours as needed for nausea or vomiting. 07/07/23   Ulysees Barns IV, MD  ramipril (ALTACE) 2.5 MG capsule TAKE 1 CAPSULE BY MOUTH EVERY DAY 06/21/23   Quintella Reichert, MD  rosuvastatin (CRESTOR) 10 MG tablet TAKE 1 TABLET BY MOUTH EVERY DAY 05/09/23   Quintella Reichert, MD  vitamin C (ASCORBIC ACID) 500 MG tablet Take 1,000 mg by mouth daily.     [provider]  vitamin E 400 UNIT capsule Take 400 Units by mouth daily.    [provider]     Vital Signs: Vitals:   07/11/23 1237  BP: 104/66  Pulse: 68  Resp: 18  Temp: (!) 97.4 F (36.3 C)  SpO2: 98%      Code Status: FULL CODE  Physical Exam: awake/alert; chest-distant but clear breath sounds bilaterally.  Heart with regular rate and rhythm.  Abdomen soft, positive bowel sounds, nontender.  No significant lower extremity edema on left, trace pretibial edema on right.  Imaging: NM PET Image Initial (PI) Skull Base To Thigh  Result Date: 07/07/2023 CLINICAL DATA:  Initial treatment strategy for B-cell lymphoma. EXAM: NUCLEAR MEDICINE PET SKULL BASE TO THIGH TECHNIQUE: 10.89 mCi F-18 FDG was injected intravenously. Full-ring PET imaging was performed from the skull base to thigh after the radiotracer. CT data was obtained and used for attenuation correction and anatomic localization. Fasting blood glucose: 83 mg/dl COMPARISON:  None Available. FINDINGS: Mediastinal blood pool activity: SUV max 1.59 Liver activity: SUV max NA NECK: No hypermetabolic lymph nodes in the neck. Incidental CT findings: None. CHEST: High right paratracheal lymph node at the level of the thoracic inlet measures 1.6 cm with SUV max of 32.47, image 45/4. Tiny node measuring 4 mm within the right axilla has an SUV max of 5.15, image 57/4. No tracer avid pulmonary nodules. Incidental CT findings: Aortic atherosclerosis  and coronary artery calcifications. 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 ABDOMEN/PELVIS: Tracer avid peripancreatic lymph node measures 2.1 cm with SUV max of 30.16, image 90/4. Multiple tracer avid mesenteric lymph nodes are identified. The largest lymph node is in the right abdominal mesentery measuring 5.2 x 3.3 cm with SUV max of 35, image 118/4. multiple small left retroperitoneal lymph nodes scratch set several small tracer avid retroperitoneal lymph nodes are also noted including 5 mm left periaortic node with SUV max of 16.93. Multiple tracer avid splenic lesions are identified. The largest measures 3.5 cm with SUV max of 23.99, image 92/4. Short segment tracer avid circumferential wall thickening involving a loop of small bowel within the right hemiabdomen  has an SUV max of 30.42, image 111/4. There is a large tracer avid nodal mass arising from the right external iliac nodal chain measuring 13.9 x 12.1 cm with an SUV max of 34.34, image 144/4. This extends along the right inguinal canal, image 152/4. There is no abnormal uptake identified within the liver, pancreas, or adrenal glands. No abnormal tracer avid kidney lesions identified. Incidental CT findings: Aortic atherosclerosis. Colonic diverticulosis without signs of acute diverticulitis. Seed implants within the prostate gland. There is asymmetric soft tissue edema and enlargement of the right lower extremity which I suspect reflects mass effect upon the right external iliac vasculature by the large nodal mass described above. Spleen measures 13 cm in length. SKELETON: No focal hypermetabolic activity to suggest skeletal metastasis. Incidental CT findings: None. IMPRESSION: 1. There is a large tracer avid nodal mass arising from the right external iliac nodal chain measuring 13.9 x 12.1 cm with an SUV max of 34.34. This extends along the right inguinal canal. 2. Multiple tracer avid lymph  nodes are identified within the chest, abdomen and pelvis compatible with lymphoma. 3. Multiple tracer avid splenic lesions are identified compatible with lymphoma. 4. Short segment tracer avid circumferential wall thickening involving a loop of small bowel within the right hemiabdomen is compatible with lymphoma. 5. Asymmetric soft tissue edema and enlargement of the right lower extremity is identified which I suspect reflects mass effect upon the right external iliac vasculature by the large nodal mass described above. 6. Coronary artery calcifications. 7. 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 8.  Aortic Atherosclerosis (ICD10-I70.0). Electronically Signed   By: Signa Kell M.D.   On: 07/07/2023 10:32    Labs:  CBC: Recent Labs    06/21/23 1207 06/24/23 1230 06/30/23 1200  WBC 4.3 3.9* 4.7  HGB 14.0 14.0 13.3  HCT 42.4 41.7 39.0  PLT 213 191 191    COAGS: Recent Labs    06/24/23 1230  INR 1.0    BMP: Recent Labs    06/21/23 1207 06/24/23 1230 06/30/23 1200  NA 139 136 138  K 4.5 4.0 4.5  CL 102 103 101  CO2 30 24 28   GLUCOSE 90 94 110*  BUN 15 19 18   CALCIUM 10.2 9.2 10.0  CREATININE 0.85 0.82 0.86  GFRNONAA >60 >60 >60    LIVER FUNCTION TESTS: Recent Labs    05/03/23 0830 06/21/23 1207 06/30/23 1200  BILITOT 0.7 0.5 0.4  AST 28 18 19   ALT 37 16 23  ALKPHOS 90 66 68  PROT 7.7 6.9 7.0  ALBUMIN 4.6 4.1 3.8    Assessment and Plan: 77 y.o. male with PMH sig for CAD/MI/stenting, HLD,HTN, chronic leukopenia, prostate cancer 2022 , recently diagnosed RLE DVT(on eliquis) as well as newly diagnosed high grade B cell lymphoma. He is scheduled today for port a cath placement to assist with treatment. Risks and benefits of image guided port-a-catheter placement was discussed with the patient including, but not limited to bleeding, infection, pneumothorax, or fibrin sheath development and need for additional  procedures.  All of the patient's questions were answered, patient is agreeable to proceed. Consent signed and in chart.    Electronically Signed: D. Jeananne Rama, PA-C 07/08/2023, 1:19 PM   I spent a total of 25 minutes at the the patient's bedside AND on the patient's hospital floor or unit, greater than 50% of which was counseling/coordinating care for port a cath placement.

## 2023-07-11 ENCOUNTER — Ambulatory Visit (HOSPITAL_COMMUNITY)
Admission: RE | Admit: 2023-07-11 | Discharge: 2023-07-11 | Disposition: A | Discharge status: Home / Self Care | Payer: Medicare Other | Source: Ambulatory Visit | Attending: Physician Assistant | Admitting: Physician Assistant

## 2023-07-11 ENCOUNTER — Other Ambulatory Visit: Payer: Self-pay

## 2023-07-11 ENCOUNTER — Encounter (HOSPITAL_COMMUNITY): Payer: Self-pay

## 2023-07-11 DIAGNOSIS — I10 Essential (primary) hypertension: Secondary | ICD-10-CM | POA: Insufficient documentation

## 2023-07-11 DIAGNOSIS — Z7901 Long term (current) use of anticoagulants: Secondary | ICD-10-CM | POA: Insufficient documentation

## 2023-07-11 DIAGNOSIS — Z8546 Personal history of malignant neoplasm of prostate: Secondary | ICD-10-CM | POA: Diagnosis not present

## 2023-07-11 DIAGNOSIS — I2510 Atherosclerotic heart disease of native coronary artery without angina pectoris: Secondary | ICD-10-CM | POA: Insufficient documentation

## 2023-07-11 DIAGNOSIS — C8513 Unspecified B-cell lymphoma, intra-abdominal lymph nodes: Secondary | ICD-10-CM | POA: Diagnosis not present

## 2023-07-11 DIAGNOSIS — Z955 Presence of coronary angioplasty implant and graft: Secondary | ICD-10-CM | POA: Diagnosis not present

## 2023-07-11 DIAGNOSIS — Z86718 Personal history of other venous thrombosis and embolism: Secondary | ICD-10-CM | POA: Diagnosis not present

## 2023-07-11 DIAGNOSIS — I252 Old myocardial infarction: Secondary | ICD-10-CM | POA: Insufficient documentation

## 2023-07-11 DIAGNOSIS — D72819 Decreased white blood cell count, unspecified: Secondary | ICD-10-CM | POA: Diagnosis not present

## 2023-07-11 DIAGNOSIS — E785 Hyperlipidemia, unspecified: Secondary | ICD-10-CM | POA: Insufficient documentation

## 2023-07-11 HISTORY — PX: IR IMAGING GUIDED PORT INSERTION: IMG5740

## 2023-07-11 MED ORDER — MIDAZOLAM HCL 2 MG/2ML IJ SOLN
INTRAMUSCULAR | Status: AC
  Filled 2023-07-11: qty 4

## 2023-07-11 MED ORDER — HEPARIN SOD (PORK) LOCK FLUSH 100 UNIT/ML IV SOLN
INTRAVENOUS | Status: AC | PRN
  Administered 2023-07-11: 500 [IU] via INTRAVENOUS

## 2023-07-11 MED ORDER — HEPARIN SOD (PORK) LOCK FLUSH 100 UNIT/ML IV SOLN
500.0000 [IU] | Freq: Once | INTRAVENOUS | Status: AC
  Administered 2023-07-11: 500 [IU] via INTRAVENOUS

## 2023-07-11 MED ORDER — FENTANYL CITRATE (PF) 100 MCG/2ML IJ SOLN
INTRAMUSCULAR | Status: AC | PRN
  Administered 2023-07-11 (×2): 50 ug via INTRAVENOUS

## 2023-07-11 MED ORDER — MIDAZOLAM HCL 2 MG/2ML IJ SOLN
INTRAMUSCULAR | Status: AC
  Filled 2023-07-11: qty 2

## 2023-07-11 MED ORDER — FENTANYL CITRATE (PF) 100 MCG/2ML IJ SOLN
INTRAMUSCULAR | Status: AC
  Filled 2023-07-11: qty 2

## 2023-07-11 MED ORDER — LIDOCAINE-EPINEPHRINE 1 %-1:100000 IJ SOLN
20.0000 mL | Freq: Once | INTRAMUSCULAR | Status: AC
  Administered 2023-07-11: 20 mL via INTRADERMAL

## 2023-07-11 MED ORDER — HEPARIN SOD (PORK) LOCK FLUSH 100 UNIT/ML IV SOLN
INTRAVENOUS | Status: AC
  Filled 2023-07-11: qty 5

## 2023-07-11 MED ORDER — LIDOCAINE-EPINEPHRINE 1 %-1:100000 IJ SOLN
INTRAMUSCULAR | Status: AC
  Filled 2023-07-11: qty 1

## 2023-07-11 MED ORDER — SODIUM CHLORIDE 0.9 % IV SOLN: INTRAVENOUS | Status: DC

## 2023-07-11 MED ORDER — MIDAZOLAM HCL 2 MG/2ML IJ SOLN
INTRAMUSCULAR | Status: AC | PRN
  Administered 2023-07-11 (×4): 1 mg via INTRAVENOUS

## 2023-07-11 NOTE — Procedures (Signed)
Vascular and Interventional Radiology Procedure Note  Patient: Eugene Moreno DOB: 08-Feb-1946 Medical Record Number: [id number] Note Date/Time: 07/11/23 2:20 PM   Performing Physician: Roanna Banning, MD Assistant(s): None  Diagnosis: Lymphoma  Procedure: PORT PLACEMENT  Anesthesia: Conscious Sedation Complications: None Estimated Blood Loss: Minimal  Findings:  Successful right-sided port placement, with the tip of the catheter in the proximal right atrium.  Plan: Catheter ready for use.  See detailed procedure note with images in PACS. The patient tolerated the procedure well without incident or complication and was returned to Recovery in stable condition.    Roanna Banning, MD Vascular and Interventional Radiology [facility] Radiology   Pager. [phone] Clinic. [phone]

## 2023-07-11 NOTE — Discharge Instructions (Signed)
Implanted Port Insertion, Care After  The following information offers guidance on how to care for yourself after your procedure. Your health care provider may also give you more specific instructions. If you have problems or questions, contact your health care provider.  What can I expect after the procedure? After the procedure, it is common to have: Discomfort at the port insertion site. Bruising on the skin over the port. This should improve over 3-4 days.   Urgent needs - Interventional Radiology, clinic [phone] (mon-fri 8-5).   Wound - May remove dressing and shower in 24 to 48 hours.  Keep site clean and dry.  Replace with bandaid as needed.  Do not submerge in tub or water until site healing well. If closed with glue, glue will flake off on its own.   If ordered by your provider, may start Emla cream (or any other creams ointments or lotions) in 2 weeks or after incision is healed. Port is ready for use immediately.   After completion of treatment, your provider should have you set up for monthly port flushes.   Follow these instructions at home: Port care After your port is placed, you will get a manufacturer's information card. The card has information about your port. Keep this card with you at all times. Take care of the port as told by your health care provider. Ask your health care provider if you or a family member can get training for taking care of the port at home. A home health care nurse will be be available to help care for the port. Make sure to remember what type of port you have. Incision care     Follow instructions from your health care provider about how to take care of your port insertion site. Make sure you: Wash your hands with soap and water for at least 20 seconds before and after you change your bandage (dressing). If soap and water are not available, use hand sanitizer. Change your dressing as told by your health care provider. Leave stitches  (sutures), skin glue, or adhesive strips in place. These skin closures may need to stay in place for 2 weeks or longer. If adhesive strip edges start to loosen and curl up, you may trim the loose edges. Do not remove adhesive strips completely unless your health care provider tells you to do that. Check your port insertion site every day for signs of infection. Check for: Redness, swelling, or pain. Fluid or blood. Warmth. Pus or a bad smell. Activity Return to your normal activities as told by your health care provider. Ask your health care provider what activities are safe for you. You may have to avoid lifting. Ask your health care provider how much you can safely lift. General instructions Take over-the-counter and prescription medicines only as told by your health care provider. Do not take baths, swim, or use a hot tub until your health care provider approves. Ask your health care provider if you may take showers. You may only be allowed to take sponge baths. If you were given a sedative during the procedure, it can affect you for several hours. Do not drive or operate machinery until your health care provider says that it is safe. Wear a medical alert bracelet in case of an emergency. This will tell any health care providers that you have a port. Keep all follow-up visits. This is important. Contact a health care provider if: You cannot flush your port with saline as directed, or you cannot   draw blood from the port. You have a fever or chills. You have redness, swelling, or pain around your port insertion site. You have fluid or blood coming from your port insertion site. Your port insertion site feels warm to the touch. You have pus or a bad smell coming from the port insertion site. Get help right away if: You have chest pain or shortness of breath. You have bleeding from your port that you cannot control. These symptoms may be an emergency. Get help right away. Call 911. Do not  wait to see if the symptoms will go away. Do not drive yourself to the hospital. Summary Take care of the port as told by your health care provider. Keep the manufacturer's information card with you at all times. Change your dressing as told by your health care provider. Contact a health care provider if you have a fever or chills or if you have redness, swelling, or pain around your port insertion site. Keep all follow-up visits. This information is not intended to replace advice given to you by your health care provider. Make sure you discuss any questions you have with your health care provider. Document Revised: 06/09/2021 Document Reviewed: 06/09/2021 Elsevier Patient Education  2023 Elsevier Inc.    Moderate Conscious Sedation  Adult  Care After (English)  After the procedure, it is common to have: Sleepiness for a few hours. Impaired judgment for a few hours. Trouble with balance. Nausea or vomiting if you eat too soon. Follow these instructions at home: For the time period you were told by your health care provider:  Rest. Do not participate in activities where you could fall or become injured. Do not drive or use machinery. Do not drink alcohol. Do not take sleeping pills or medicines that cause drowsiness. Do not make important decisions or sign legal documents. Do not take care of children on your own. Eating and drinking Follow instructions from your health care provider about what you may eat and drink. Drink enough fluid to keep your urine pale yellow. If you vomit: Drink clear fluids slowly and in small amounts as you are able. Clear fluids include water, ice chips, low-calorie sports drinks, and fruit juice that has water added to it (diluted fruit juice). Eat light and bland foods in small amounts as you are able. These foods include bananas, applesauce, rice, lean meats, toast, and crackers. General instructions Take over-the-counter and prescription medicines  only as told by your health care provider. Have a responsible adult stay with you for the time you are told. Do not use any products that contain nicotine or tobacco. These products include cigarettes, chewing tobacco, and vaping devices, such as e-cigarettes. If you need help quitting, ask your health care provider. Return to your normal activities as told by your health care provider. Ask your health care provider what activities are safe for you. Your health care provider may give you more instructions. Make sure you know what you can and cannot do. Contact a health care provider if: You are still sleepy or having trouble with balance after 24 hours. You feel light-headed. You vomit every time you eat or drink. You get a rash. You have a fever. You have redness or swelling around the IV site. Get help right away if: You have trouble breathing. You start to feel confused at home. These symptoms may be an emergency. Get help right away. Call 911. Do not wait to see if the symptoms will go away. Do not   drive yourself to the hospital. This information is not intended to replace advice given to you by your health care provider. Make sure you discuss any questions you have with your health care provider. 

## 2023-07-12 ENCOUNTER — Telehealth: Payer: Self-pay | Admitting: Hematology and Oncology

## 2023-07-12 ENCOUNTER — Telehealth: Payer: Self-pay

## 2023-07-12 ENCOUNTER — Other Ambulatory Visit: Payer: Self-pay | Admitting: Hematology and Oncology

## 2023-07-12 ENCOUNTER — Other Ambulatory Visit: Payer: Self-pay

## 2023-07-12 DIAGNOSIS — C8338 Diffuse large B-cell lymphoma, lymph nodes of multiple sites: Secondary | ICD-10-CM

## 2023-07-12 NOTE — Telephone Encounter (Signed)
Pt called about his appts for his chemo treatments.  Pt stated his portacath was placed on 07/11/2023 but he does not have appts for his chemo treatments.  Informed pt that this nurse w/f/u w/Dr. Leonides Schanz and will f/u with the pt.  Pt verbalized understanding.

## 2023-07-12 NOTE — Telephone Encounter (Signed)
Called pt to let him know that Patricia Nettle from Dr[facility] Scheduling Team will be contacting him to get him scheduled for his chemo treatments.  Pt stated that he just spoke w/Vanessa and he's all scheduled.  Pt was very grateful for this nurse's help w/getting this resolved.

## 2023-07-13 ENCOUNTER — Other Ambulatory Visit (HOSPITAL_COMMUNITY): Payer: Medicare Other

## 2023-07-14 MED FILL — Fosaprepitant Dimeglumine For IV Infusion 150 MG (Base Eq): INTRAVENOUS | Qty: 5 | Status: AC

## 2023-07-14 MED FILL — Dexamethasone Sodium Phosphate Inj 100 MG/10ML: INTRAMUSCULAR | Qty: 1 | Status: AC

## 2023-07-14 NOTE — Progress Notes (Signed)
[facility] [phone]) [phone]   [phone]) [phone]  PROGRESS NOTE  Patient Care Team: Orpha Bur, MD as PCP - General (Family Medicine) Quintella Reichert, MD as PCP - Cardiology (Cardiology) Crist Fat, MD as Attending Physician (Urology) Margaretmary Dys, MD as Consulting Physician (Radiation Oncology) Maryclare Labrador, RN as Registered Nurse Jaci Standard, MD as Consulting Physician (Hematology and Oncology)  Hematological/Oncological History # Diffuse Large B Cell Lymphoma, Stage III Presented to PCP with swelling and redness in his right leg x 3 weeks. 06/13/2023: Doppler US showed acute DVT involving right  common femoral vein, SF junction, right femoral vein, right proximal profunda vein, and external iliac vein. Incident finding included pelvic lesion with minimum vascularity anterior to the prostate, measuring 11.8 x 9.6 12.6 cm, with a volume of 749.3 cubic centimeters. Prior MR on 04/11/2021 reports 1.7 x .07 x 1.1 cm category 4 lesion. This lesion appears to be causing extrinsic compression on the external iliac vein (proximal external iliac vein not visualized due to extrinsic compression).  06/16/2023: CT angio abdomen/pelvis: Large lobulated mass in the right pelvis encasing the right iliac vessels and abutting the anterior pelvic wall as well as right iliacus muscle and right bladder dome. This mass consistent with a neoplastic process and features most suggestive of lymphoma.Multiple splenic lesions most consistent with metastatic disease.Short segment thick wall small bowel in the mid abdomen to the right of midline most consistent with infiltrative malignancy/lymphoma. Right pelvic sidewall mass/adenopathy.Cholelithiasis. Colonic diverticulosis. Aortic Atherosclerosis. 06/21/2023: Establish care with [facility].  06/24/2023: US guided biopsy of abdominal mass. Pathology is consistent with B-cell lymphoma with a high proliferation rate of  80-90%. Morphologically the findings are most consistent with  a diffuse large B-cell lymphoma. FISH studies r/o Burkitt's lymphoma or double hit lymphoma. 07/15/2023: Cycle 1 Day 1 of R-CHOP chemotherapy.   Interval History:  Eugene Moreno 77 y.o. male with medical history significant for diffuse large B-cell lymphoma who presents for a follow up visit. The patient's last visit was on 06/30/2023. In the interim since the last visit he has completed PET CT scan, port placement, and echocardiogram.  On exam today Mr. Leaming reports he has had no changes in his health in the interim since her last visit.  He reports the port is settling in it, but he had difficulty with the adhesive tape used to cover the initial bandage.  He reports they were able to access his blood without difficulty today.  He notes he is eating well and has not developed any infectious symptoms such as fevers, chills, sweats, nausea, vomiting or diarrhea.  He has lost about 3 pounds in weight in the interim since our last discussion.  Overall he is willing and able to proceed with chemotherapy today.  A full 10 point ROS is otherwise negative.  The bulk of our discussion focused on assuring he had everything necessary to start his treatment today.  All last-minute questions and concerns were addressed prior to the start of treatment.  MEDICAL HISTORY:  Past Medical History:  Diagnosis Date   Coronary artery disease    cardiologist--- dr t. turner;  hx AWMI 09-06-2001  s/p cath with PCI and DES x1 to prox LAD;  last nuclear study 05-10-2016 in epic, anterior scar without ishcemia, nuclear ef 44%)   History of MI (myocardial infarction) 09/06/2001   anterior wall   Hyperlipemia    Hypertension    Leukopenia    chronic  mild   Prostate cancer [facility])    urologist-- dr Marlou Porch--- dx 05/ 2022, Gleason 4+3, PSA 8.04   S/P drug eluting coronary stent placement 09/06/2001   x1 to prox LAD   Wears glasses    Wears hearing aid  in both ears     SURGICAL HISTORY: Past Surgical History:  Procedure Laterality Date   ANTERIOR CERVICAL DECOMP/DISCECTOMY FUSION  01/26/2002   @[facility]  by dr Jeral Fruit;   C6-C7   COLONOSCOPY  2020   CORONARY ANGIOPLASTY WITH STENT PLACEMENT  09/06/2001   @[facility]  by dr s. 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   SPACE OAR INSTILLATION N/A 09/03/2021   Procedure: SPACE OAR INSTILLATION;  Surgeon: Crist Fat, MD;  Location: [facility];  Service: Urology;  Laterality: N/A;   WISDOM TOOTH EXTRACTION      SOCIAL HISTORY: Social History   Socioeconomic History   Marital status: Married    Spouse name: Not on file   Number of children: Not on file   Years of education: Not on file   Highest education level: Not on file  Occupational History   Occupation: retired    Associate Professor: [employer]    Comment: Systems analyst  Tobacco Use   Smoking status: Former    Current packs/day: 0.00    Types: Cigarettes    Start date: 1965    Quit date: 1969    Years since quitting: 55.6   Smokeless tobacco: Never  Vaping Use   Vaping status: Never Used  Substance and Sexual Activity   Alcohol use: Yes    Comment: occasional   Drug use: Never   Sexual activity: Yes    Comment: vasectomy  Other Topics Concern   Not on file  Social History Narrative   Not on file   Social Determinants of Health   Financial Resource Strain: Not on file  Food Insecurity: No Food Insecurity (06/21/2023)   Hunger Vital Sign    Worried About Running Out of Food in the Last Year: Never true    Ran Out of Food in the Last Year: Never true  Transportation  Needs: No Transportation Needs (06/21/2023)   PRAPARE - Administrator, Civil Service (Medical): No    Lack of Transportation (Non-Medical): No  Physical Activity: Not on file  Stress: Not on file  Social Connections: Not on file  Intimate Partner Violence: Not At Risk (06/21/2023)   Humiliation, Afraid, Rape, and Kick questionnaire    Fear of Current or Ex-Partner: No    Emotionally Abused: No    Physically Abused: No    Sexually Abused: No    FAMILY HISTORY: Family History  Problem Relation Age of Onset   Breast cancer Mother 56   Heart attack Father        x3 between ages 19-64   Other Sister 61       angioplasty    ALLERGIES:  is allergic to niacin and related.  MEDICATIONS:  Current Outpatient Medications  Medication Sig Dispense Refill   allopurinol (ZYLOPRIM) 300 MG tablet Take 1 tablet (300 mg total) by mouth daily. 90 tablet  1   apixaban (ELIQUIS) 5 MG TABS tablet Take 1 tablet (5 mg total) by mouth 2 (two) times daily. Start taking after completion of starter pack. 60 tablet 5   APIXABAN (ELIQUIS) VTE STARTER PACK (10MG  AND 5MG ) Take as directed on package: start with two-5mg  tablets twice daily for 7 days. On day 8, switch to one-5mg  tablet twice daily. 74 each 0   aspirin EC 81 MG tablet Take 2 tablets (162 mg total) by mouth daily.     Cetirizine HCl (ZYRTEC ALLERGY) 10 MG CAPS Take 1 capsule by mouth daily.     Cod Liver Oil CAPS Take 2 capsules by mouth daily.     enoxaparin (LOVENOX) 100 MG/ML injection Inject 0.9 mLs (90 mg total) into the skin every 12 (twelve) hours for 2 days. Hold injection 12 hours before port placement 3.6 mL 0   ferrous fumarate (HEMOCYTE - 106 MG FE) 325 (106 FE) MG TABS Take 1 tablet by mouth daily.     fluticasone (FLONASE) 50 MCG/ACT nasal spray Place 1 spray into both nostrils 2 (two) times daily.     folic acid (FOLVITE) 800 MCG tablet Take 800 mcg by mouth daily.      icosapent Ethyl (VASCEPA) 1 g capsule Take 2 capsules  (2 g total) by mouth 2 (two) times daily. 120 capsule 8   lidocaine-prilocaine (EMLA) cream Apply 1 Application topically as needed. 30 g 0   ondansetron (ZOFRAN) 8 MG tablet Take 1 tablet (8 mg total) by mouth every 8 (eight) hours as needed. 30 tablet 0   predniSONE (DELTASONE) 20 MG tablet Take 3 tablets (60 mg total) by mouth daily with breakfast. 15 tablet 5   prochlorperazine (COMPAZINE) 10 MG tablet Take 1 tablet (10 mg total) by mouth every 6 (six) hours as needed for nausea or vomiting. 30 tablet 0   ramipril (ALTACE) 2.5 MG capsule TAKE 1 CAPSULE BY MOUTH EVERY DAY 90 capsule 3   rosuvastatin (CRESTOR) 10 MG tablet TAKE 1 TABLET BY MOUTH EVERY DAY 90 tablet 3   vitamin C (ASCORBIC ACID) 500 MG tablet Take 1,000 mg by mouth daily.      vitamin E 400 UNIT capsule Take 400 Units by mouth daily.     No current facility-administered medications for this visit.    REVIEW OF SYSTEMS:   Constitutional: ( - ) fevers, ( - )  chills , ( - ) night sweats Eyes: ( - ) blurriness of vision, ( - ) double vision, ( - ) watery eyes Ears, nose, mouth, throat, and face: ( - ) mucositis, ( - ) sore throat Respiratory: ( - ) cough, ( - ) dyspnea, ( - ) wheezes Cardiovascular: ( - ) palpitation, ( - ) chest discomfort, ( - ) lower extremity swelling Gastrointestinal:  ( - ) nausea, ( - ) heartburn, ( - ) change in bowel habits Skin: ( - ) abnormal skin rashes Lymphatics: ( - ) new lymphadenopathy, ( - ) easy bruising Neurological: ( - ) numbness, ( - ) tingling, ( - ) new weaknesses Behavioral/Psych: ( - ) mood change, ( - ) new changes  All other systems were reviewed with the patient and are negative.  PHYSICAL EXAMINATION: ECOG PERFORMANCE STATUS: 1 - Symptomatic but completely ambulatory  Vitals:   07/15/23 0853  BP: 111/74  Pulse: 81  Resp: 16  Temp: 97.7 F (36.5 C)  SpO2: 97%   Filed Weights   07/15/23 0853  Weight: 195 lb  9.6 oz (88.7 kg)    GENERAL: Well-appearing elderly  Caucasian male, alert, no distress and comfortable SKIN: skin color, texture, turgor are normal, no rashes or significant lesions EYES: conjunctiva are pink and non-injected, sclera clear LUNGS: clear to auscultation and percussion with normal breathing effort HEART: regular rate & rhythm and no murmurs and no lower extremity edema Musculoskeletal: no cyanosis of digits and no clubbing  PSYCH: alert & oriented x 3, fluent speech NEURO: no focal motor/sensory deficits  LABORATORY DATA:  I have reviewed the data as listed    Latest Ref Rng & Units 07/15/2023    8:18 AM 06/30/2023   12:00 PM 06/24/2023   12:30 PM  CBC  WBC 4.0 - 10.5 K/uL 5.2  4.7  3.9   Hemoglobin 13.0 - 17.0 g/dL 84.6  96.2  95.2   Hematocrit 39.0 - 52.0 % 35.9  39.0  41.7   Platelets 150 - 400 K/uL 224  191  191        Latest Ref Rng & Units 07/15/2023    8:18 AM 06/30/2023   12:00 PM 06/24/2023   12:30 PM  CMP  Glucose 70 - 99 mg/dL 86  841  94   BUN 8 - 23 mg/dL 22  18  19    Creatinine 0.61 - 1.24 mg/dL 3.24  4.01  0.27   Sodium 135 - 145 mmol/L 138  138  136   Potassium 3.5 - 5.1 mmol/L 4.3  4.5  4.0   Chloride 98 - 111 mmol/L 102  101  103   CO2 22 - 32 mmol/L 26  28  24    Calcium 8.9 - 10.3 mg/dL 9.5  25.3  9.2   Total Protein 6.5 - 8.1 g/dL 6.4  7.0    Total Bilirubin 0.3 - 1.2 mg/dL 0.5  0.4    Alkaline Phos 38 - 126 U/L 66  68    AST 15 - 41 U/L 21  19    ALT 0 - 44 U/L 24  23      Lab Results  Component Value Date   MPROTEIN Not Observed 11/05/2016   Lab Results  Component Value Date   KPAFRELGTCHN 2.46 (H) 11/03/2015   KPAFRELGTCHN 2.86 (H) 09/16/2011   KPAFRELGTCHN 2.86 (H) 09/16/2011   LAMBDASER 1.71 11/03/2015   LAMBDASER 2.65 (H) 09/16/2011   LAMBDASER 2.65 (H) 09/16/2011   KAPLAMBRATIO 1.25 11/05/2016   KAPLAMBRATIO 1.44 11/03/2015   KAPLAMBRATIO 1.08 09/16/2011   KAPLAMBRATIO 1.08 09/16/2011    RADIOGRAPHIC STUDIES: IR IMAGING GUIDED PORT INSERTION  Result Date:  07/11/2023 INDICATION: New diagnosis of lymphoma.  Port for chemo EXAM: IMPLANTED PORT A CATH PLACEMENT WITH ULTRASOUND AND FLUOROSCOPIC GUIDANCE MEDICATIONS: None ANESTHESIA/SEDATION: Moderate (conscious) sedation was employed during this procedure. A total of Versed 4 mg and Fentanyl 100 mcg was administered intravenously. Moderate Sedation Time: 18 minutes. The patient's level of consciousness and vital signs were monitored continuously by radiology nursing throughout the procedure under my direct supervision. FLUOROSCOPY TIME:  Fluoroscopic dose; 0 mGy COMPLICATIONS: None immediate. PROCEDURE: The procedure, risks, benefits, and alternatives were explained to the patient. Questions regarding the procedure were encouraged and answered. The patient understands and consents to the procedure. The RIGHT neck and chest were prepped with chlorhexidine in a sterile fashion, and a sterile drape was applied covering the operative field. Maximum barrier sterile technique with sterile gowns and gloves were used for the procedure. A timeout was performed prior to the initiation of the procedure.  Local anesthesia was provided with 1% lidocaine with epinephrine. After creating a small venotomy incision, a micropuncture kit was utilized to access the internal jugular vein under direct, real-time ultrasound guidance. Ultrasound image documentation was performed. The microwire was kinked to measure appropriate catheter length. A subcutaneous port pocket was then created along the upper chest wall utilizing a combination of sharp and blunt dissection. The pocket was irrigated with sterile saline. A single lumen Non-ISP power injectable port was chosen for placement. The 8 Fr catheter was tunneled from the port pocket site to the venotomy incision. The port was placed in the pocket. The external catheter was trimmed to appropriate length. At the venotomy, an 8 Fr peel-away sheath was placed over a guidewire under fluoroscopic  guidance. The catheter was then placed through the sheath and the sheath was removed. Final catheter positioning was confirmed and documented with a fluoroscopic spot radiograph. The port was accessed with a Huber needle, aspirated and flushed with heparinized saline. The port pocket incision was closed with interrupted 3-0 Vicryl suture then Dermabond was applied, including at the venotomy incision. Dressings were placed. The patient tolerated the procedure well without immediate post procedural complication. IMPRESSION: Successful placement of a RIGHT internal jugular approach power injectable Port-A-Cath. The tip of the catheter is positioned within the proximal RIGHT atrium. The catheter is ready for immediate use. Roanna Banning, MD [facility] Electronically Signed   By: Roanna Banning M.D.   On: 07/11/2023 15:38   NM PET Image Initial (PI) Skull Base To Thigh  Result Date: 07/07/2023 CLINICAL DATA:  Initial treatment strategy for B-cell lymphoma. EXAM: NUCLEAR MEDICINE PET SKULL BASE TO THIGH TECHNIQUE: 10.89 mCi F-18 FDG was injected intravenously. Full-ring PET imaging was performed from the skull base to thigh after the radiotracer. CT data was obtained and used for attenuation correction and anatomic localization. Fasting blood glucose: 83 mg/dl COMPARISON:  None Available. FINDINGS: Mediastinal blood pool activity: SUV max 1.59 Liver activity: SUV max NA NECK: No hypermetabolic lymph nodes in the neck. Incidental CT findings: None. CHEST: High right paratracheal lymph node at the level of the thoracic inlet measures 1.6 cm with SUV max of 32.47, image 45/4. Tiny node measuring 4 mm within the right axilla has an SUV max of 5.15, image 57/4. No tracer avid pulmonary nodules. Incidental CT findings: Aortic atherosclerosis and coronary artery calcifications. 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 ABDOMEN/PELVIS: Tracer avid peripancreatic lymph node measures 2.1 cm with SUV max of 30.16, image 90/4. Multiple tracer avid mesenteric lymph nodes are identified. The largest lymph node is in the right abdominal mesentery measuring 5.2 x 3.3 cm with SUV max of 35, image 118/4. multiple small left retroperitoneal lymph nodes scratch set several small tracer avid retroperitoneal lymph nodes are also noted including 5 mm left periaortic node with SUV max of 16.93. Multiple tracer avid splenic lesions are identified. The largest measures 3.5 cm with SUV max of 23.99, image 92/4. Short segment tracer avid circumferential wall thickening involving a loop of small bowel within the right hemiabdomen has an SUV max of 30.42, image 111/4. There is a large tracer avid nodal mass arising from the right external iliac nodal chain measuring 13.9 x 12.1 cm with an SUV max of 34.34, image 144/4. This extends along the right inguinal canal, image 152/4. There is no abnormal uptake identified within the liver, pancreas, or adrenal glands. No abnormal tracer avid kidney lesions  identified. Incidental CT findings: Aortic atherosclerosis. Colonic diverticulosis without signs of acute diverticulitis. Seed implants within the prostate gland. There is asymmetric soft tissue edema and enlargement of the right lower extremity which I suspect reflects mass effect upon the right external iliac vasculature by the large nodal mass described above. Spleen measures 13 cm in length. SKELETON: No focal hypermetabolic activity to suggest skeletal metastasis. Incidental CT findings: None. IMPRESSION: 1. There is a large tracer avid nodal mass arising from the right external iliac nodal chain measuring 13.9 x 12.1 cm with an SUV max of 34.34. This extends along the right inguinal canal. 2. Multiple tracer avid lymph nodes are identified within the chest, abdomen and pelvis compatible with lymphoma. 3. Multiple tracer avid splenic lesions are  identified compatible with lymphoma. 4. Short segment tracer avid circumferential wall thickening involving a loop of small bowel within the right hemiabdomen is compatible with lymphoma. 5. Asymmetric soft tissue edema and enlargement of the right lower extremity is identified which I suspect reflects mass effect upon the right external iliac vasculature by the large nodal mass described above. 6. Coronary artery calcifications. 7. 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 8.  Aortic Atherosclerosis (ICD10-I70.0). Electronically Signed   By: Signa Kell M.D.   On: 07/07/2023 10:32   ECHOCARDIOGRAM COMPLETE  Result Date: 07/04/2023    ECHOCARDIOGRAM REPORT   Patient Name:   SAVAS ELVIN Date of Exam: 07/04/2023 Medical Rec #:  [id number]         Height:       71.0 in Accession #:    [id number]        Weight:       198.3 lb Date of Birth:  27-Aug-1946         BSA:          2.101 m Patient Age:    77 years          BP:           123/62 mmHg Patient Gender: M                 HR:           76 bpm. Exam Location:  [address] Procedure: 2D Echo, Cardiac Doppler, Color Doppler, 3D Echo and Strain Analysis Indications:    Z09 Chemotherapy  History:        Patient has prior history of Echocardiogram examinations, most                 recent 12/10/2008. CAD and Previous Myocardial Infarction; Risk                 Factors:Hypertension, Dyslipidemia and Former Smoker. B-cell                 lymphoma of intra-abdominal lymph nodes.  Sonographer:    Cathie Beams RCS Referring Phys: [id number] IRENE T THAYIL IMPRESSIONS  1. Left ventricular ejection fraction, by estimation, is 55 to 60%. The left ventricle has normal function. The left ventricle has no regional wall motion abnormalities. There is mild concentric left ventricular hypertrophy. Left ventricular diastolic parameters are consistent with Grade I diastolic dysfunction (impaired relaxation).  2. Right ventricular  systolic function is normal. The right ventricular size is normal.  3. The mitral valve is normal in structure. Trivial mitral valve regurgitation. No evidence of mitral stenosis.  4. The aortic valve is tricuspid. There is mild calcification of the aortic valve. Aortic valve regurgitation is  not visualized. Aortic valve sclerosis/calcification is present, without any evidence of aortic stenosis.  5. Aortic dilatation noted. There is mild dilatation of the aortic root, measuring 40 mm. There is mild dilatation of the ascending aorta, measuring 41 mm.  6. The inferior vena cava is normal in size with greater than 50% respiratory variability, suggesting right atrial pressure of 3 mmHg. FINDINGS  Left Ventricle: Left ventricular ejection fraction, by estimation, is 55 to 60%. The left ventricle has normal function. The left ventricle has no regional wall motion abnormalities. The left ventricular internal cavity size was normal in size. There is  mild concentric left ventricular hypertrophy. Left ventricular diastolic parameters are consistent with Grade I diastolic dysfunction (impaired relaxation). Right Ventricle: The right ventricular size is normal. No increase in right ventricular wall thickness. Right ventricular systolic function is normal. Left Atrium: Left atrial size was normal in size. Right Atrium: Right atrial size was normal in size. Pericardium: There is no evidence of pericardial effusion. Mitral Valve: The mitral valve is normal in structure. Trivial mitral valve regurgitation. No evidence of mitral valve stenosis. Tricuspid Valve: The tricuspid valve is normal in structure. Tricuspid valve regurgitation is trivial. No evidence of tricuspid stenosis. Aortic Valve: The aortic valve is tricuspid. There is mild calcification of the aortic valve. Aortic valve regurgitation is not visualized. Aortic valve sclerosis/calcification is present, without any evidence of aortic stenosis. Pulmonic Valve: The  pulmonic valve was normal in structure. Pulmonic valve regurgitation is mild. No evidence of pulmonic stenosis. Aorta: Aortic dilatation noted. There is mild dilatation of the aortic root, measuring 40 mm. There is mild dilatation of the ascending aorta, measuring 41 mm. Venous: The inferior vena cava is normal in size with greater than 50% respiratory variability, suggesting right atrial pressure of 3 mmHg. IAS/Shunts: No atrial level shunt detected by color flow Doppler.  LEFT VENTRICLE PLAX 2D LVIDd:         3.30 cm   Diastology LVIDs:         2.40 cm   LV e' medial:    6.09 cm/s LV PW:         1.20 cm   LV E/e' medial:  10.9 LV IVS:        1.20 cm   LV e' lateral:   9.14 cm/s LVOT diam:     2.00 cm   LV E/e' lateral: 7.3 LV SV:         65 LV SV Index:   31        2D Longitudinal Strain LVOT Area:     3.14 cm  2D Strain GLS Avg:     -16.8 %                           3D Volume EF:                          3D EF:        49 %                          LV EDV:       137 ml                          LV ESV:       70 ml  LV SV:        67 ml RIGHT VENTRICLE RV Basal diam:  2.30 cm RV S prime:     11.60 cm/s TAPSE (M-mode): 2.6 cm RVSP:           22.7 mmHg LEFT ATRIUM             Index        RIGHT ATRIUM           Index LA diam:        2.50 cm 1.19 cm/m   RA Pressure: 3.00 mmHg LA Vol (A2C):   26.4 ml 12.57 ml/m  RA Area:     10.70 cm LA Vol (A4C):   20.2 ml 9.61 ml/m   RA Volume:   19.10 ml  9.09 ml/m LA Biplane Vol: 24.1 ml 11.47 ml/m  AORTIC VALVE LVOT Vmax:   116.00 cm/s LVOT Vmean:  71.400 cm/s LVOT VTI:    0.208 m  AORTA Ao Root diam: 4.00 cm Ao Asc diam:  4.10 cm MITRAL VALVE               TRICUSPID VALVE MV Area (PHT): 2.44 cm    TR Peak grad:   19.7 mmHg MV Decel Time: 311 msec    TR Vmax:        222.00 cm/s MV E velocity: 66.60 cm/s  Estimated RAP:  3.00 mmHg MV A velocity: 87.00 cm/s  RVSP:           22.7 mmHg MV E/A ratio:  0.77                            SHUNTS                             Systemic VTI:  0.21 m                            Systemic Diam: 2.00 cm Arvilla Meres MD Electronically signed by Arvilla Meres MD Signature Date/Time: 07/04/2023/1:22:40 PM    Final    US BIOPSY (ABDOMINAL RETROPERTIONEAL MASS)  Result Date: 06/24/2023 INDICATION: 77 year old male with large right lower quadrant abdominal mass. EXAM: Ultrasound-guided abdominal mass biopsy MEDICATIONS: None. ANESTHESIA/SEDATION: Moderate (conscious) sedation was employed during this procedure. A total of Versed 2 mg and Fentanyl 100 mcg was administered intravenously by the radiology nurse. Total intra-service moderate Sedation Time: 10 minutes. The patient's level of consciousness and vital signs were monitored continuously by radiology nursing throughout the procedure under my direct supervision. COMPLICATIONS: None immediate. PROCEDURE: Informed written consent was obtained from the patient after a thorough discussion of the procedural risks, benefits and alternatives. All questions were addressed. Maximal Sterile Barrier Technique was utilized including caps, mask, sterile gowns, sterile gloves, sterile drape, hand hygiene and skin antiseptic. A timeout was performed prior to the initiation of the procedure. Preprocedure ultrasound evaluation demonstrated large, heterogeneously hypoechoic mass in the right inguinal and iliac region. The procedure was planned. Subdermal Local anesthesia was provided at the planned needle entry site with 1% lidocaine. Deeper local anesthetic was administered to the periphery of the mass under ultrasound visualization. A small skin nick was made. Under direct ultrasound visualization, a 17 gauge coaxial introducer needle was inserted to the periphery of the mass. A total of 4, 18 gauge core biopsies were obtained in the samples were  split between formalin and saline. The needle was removed. Hemostasis was achieved with brief manual compression. Postprocedure ultrasound evaluation  demonstrated no evidence of surrounding hematoma or other complicating features. The patient tolerated the procedure well and was transferred to the recovery area in good condition. IMPRESSION: Technically successful core biopsy of right lower quadrant abdominal mass. Marliss Coots, MD [facility] Radiology Electronically Signed   By: Marliss Coots M.D.   On: 06/24/2023 16:23   CT ANGIO ABDOMEN PELVIS  W & WO CONTRAST  Result Date: 06/16/2023 CLINICAL DATA:  Iliac artery dissection. EXAM: CTA ABDOMEN AND PELVIS WITHOUT AND WITH CONTRAST TECHNIQUE: Multidetector CT imaging of the abdomen and pelvis was performed using the standard protocol during bolus administration of intravenous contrast. Multiplanar reconstructed images and MIPs were obtained and reviewed to evaluate the vascular anatomy. RADIATION DOSE REDUCTION: This exam was performed according to the departmental dose-optimization program which includes automated exposure control, adjustment of the mA and/or kV according to patient size and/or use of iterative reconstruction technique. CONTRAST:  OMNIPAQUE IOHEXOL 350 MG/ML SOLN COMPARISON:  None Available. FINDINGS: VASCULAR Aorta: Mild atherosclerotic calcification of the abdominal aorta. No aneurysmal dilatation or dissection. No periaortic fluid collection. Celiac: Patent without evidence of aneurysm, dissection, vasculitis or significant stenosis. SMA: Patent without evidence of aneurysm, dissection, vasculitis or significant stenosis. Renals: Both renal arteries are patent without evidence of aneurysm, dissection, vasculitis, fibromuscular dysplasia or significant stenosis. IMA: Patent without evidence of aneurysm, dissection, vasculitis or significant stenosis. Inflow: Mild atherosclerotic calcification. No aneurysmal dilatation or dissection. The iliac arteries are patent. Proximal Outflow: The visualized proximal outflow is patent. Veins: The  IVC is unremarkable. The SMV, splenic vein, and main portal vein are patent. No portal venous gas. Review of the MIP images confirms the above findings. NON-VASCULAR Lower chest: The visualized lung bases are clear. There is coronary vascular calcification. No intra-abdominal free air or free fluid. Hepatobiliary: The liver is unremarkable. No biliary dilatation. The gallbladder is partially contracted. Several small stones noted in the gallbladder. No pericholecystic fluid or evidence of acute cholecystitis by CT. Pancreas: Unremarkable. No pancreatic ductal dilatation or surrounding inflammatory changes. Spleen: Multiple hypoenhancing splenic lesions most consistent with metastatic disease. These measure up to approximately 3.2 cm. Adrenals/Urinary Tract: The adrenal glands are unremarkable. There is no hydronephrosis on either side. Small left renal cysts. The visualized ureters appear unremarkable. The urinary bladder is minimally distended. There is compression of the right bladder dome by the pelvic mass. Stomach/Bowel: There is sigmoid diverticulosis and scattered colonic diverticula without active inflammatory changes. There is moderate stool throughout the colon. There is dilatation of a short segment thick wall small bowel in the mid abdomen to the right of midline (axial 35/11 and coronal 54/13) most consistent with infiltrative malignancy/lymphoma. There is no bowel obstruction or active inflammation. The appendix is normal. Lymphatic: Right pelvic sidewall mass/adenopathy measures 3.3 x 2.9 cm (68/11). Reproductive: Prostate brachytherapy seeds. Other: There is a 10 x 13 cm lobulated soft tissue mass in the right pelvis abutting the right iliacus muscle and encasing the right iliac vessels. This mass demonstrates areas of decreased enhancement centrally suggestive of necrotic tissue. The mass abuts the right bladder dome with loss of fat plane concerning for infiltration into the bladder wall.  Additionally this mass abuts the anterior peritoneal wall and right rectus sheath with loss of fat plane. There is also extension of the mass into the right inguinal canal. Musculoskeletal: No acute osseous  pathology. IMPRESSION: 1. No acute vascular pathology. 2. Large lobulated mass in the right pelvis encasing the right iliac vessels and abutting the anterior pelvic wall as well as right iliacus muscle and right bladder dome. This mass consistent with a neoplastic process and features most suggestive of lymphoma. Clinical correlation recommended. 3. Multiple splenic lesions most consistent with metastatic disease. 4. Short segment thick wall small bowel in the mid abdomen to the right of midline most consistent with infiltrative malignancy/lymphoma. 5. Right pelvic sidewall mass/adenopathy. 6. Cholelithiasis. 7. Colonic diverticulosis. 8.  Aortic Atherosclerosis (ICD10-I70.0). Electronically Signed   By: Elgie Collard M.D.   On: 06/16/2023 16:06    ASSESSMENT & PLAN Eugene Moreno 77 y.o. male with medical history significant for diffuse large B-cell lymphoma who presents for a follow up visit.   #Diffuse Large B-cell Lymphoma, Stage III --Underwent US guided biopsy of pelvic mass on 06/24/2023.  Pathology is consistent with B-cell lymphoma with a high proliferation rate of 80-90%. Morphologically the findings are most consistent with a diffuse large B-cell lymphoma. FISH studies r/o Burkitt's lymphoma or double hit lymphoma. --PET CT scan on 07/06/2023 showed right external iliac nodal chain measuring 13.9 x 12.1 cm with an SUV max of 34.34. Multiple tracer avid lymph nodes are identified within the chest, abdomen and pelvis compatible with lymphoma. Consistent with Stage III disease.  PLAN: -- Will proceed with cycle 1 day 1 of R-CHOP chemotherapy. --Labs today show white blood cell 5.2, hemoglobin 12.2, MCV 93.5, and platelets of 224 --Plan for interval PET CT scan between cycles 3 and 4. --RTC  in 3 weeks for Cycle 2 with interval weekly labs.   #Supportive Care -- chemotherapy education complete -- port placed -- zofran 8mg  q8H PRN and compazine 10mg  PO q6H for nausea -- allopurinol 300mg  PO daily for TLS prophylaxis -- EMLA cream for port -- no pain medication required at this time.    Orders Placed This Encounter  Procedures   CBC with Differential (Cancer Center Only)    Standing Status:   Standing    Number of Occurrences:   2    Standing Expiration Date:   07/15/2024   CMP (Cancer Center only)    Standing Status:   Standing    Number of Occurrences:   2    Standing Expiration Date:   07/15/2024   Uric acid    Standing Status:   Standing    Number of Occurrences:   2    Standing Expiration Date:   07/15/2024   Lactate dehydrogenase (LDH)    Standing Status:   Standing    Number of Occurrences:   2    Standing Expiration Date:   07/15/2024    All questions were answered. The patient knows to call the clinic with any problems, questions or concerns.  A total of more than 30 minutes were spent on this encounter with face-to-face time and non-face-to-face time, including preparing to see the patient, ordering tests and/or medications, counseling the patient and coordination of care as outlined above.   Ulysees Barns, MD Department of Hematology/Oncology [facility] Phone: [phone] Pager: [phone] Email: [email or web address]  07/16/2023 11:44 AM

## 2023-07-15 ENCOUNTER — Inpatient Hospital Stay: Payer: Medicare Other

## 2023-07-15 ENCOUNTER — Encounter: Payer: Self-pay | Admitting: *Deleted

## 2023-07-15 ENCOUNTER — Inpatient Hospital Stay: Payer: Medicare Other | Admitting: Hematology and Oncology

## 2023-07-15 ENCOUNTER — Other Ambulatory Visit: Payer: Self-pay

## 2023-07-15 VITALS — BP 111/74 | HR 81 | Temp 97.7°F | Resp 16 | Wt 195.6 lb

## 2023-07-15 VITALS — BP 93/62 | HR 71 | Temp 98.6°F | Resp 18

## 2023-07-15 DIAGNOSIS — C8338 Diffuse large B-cell lymphoma, lymph nodes of multiple sites: Secondary | ICD-10-CM

## 2023-07-15 DIAGNOSIS — C8333 Diffuse large B-cell lymphoma, intra-abdominal lymph nodes: Secondary | ICD-10-CM | POA: Diagnosis not present

## 2023-07-15 DIAGNOSIS — D7389 Other diseases of spleen: Secondary | ICD-10-CM

## 2023-07-15 DIAGNOSIS — Z95828 Presence of other vascular implants and grafts: Secondary | ICD-10-CM | POA: Insufficient documentation

## 2023-07-15 DIAGNOSIS — I82491 Acute embolism and thrombosis of other specified deep vein of right lower extremity: Secondary | ICD-10-CM | POA: Diagnosis not present

## 2023-07-15 LAB — CBC WITH DIFFERENTIAL (CANCER CENTER ONLY)
Abs Immature Granulocytes: 0.02 10*3/uL (ref 0.00–0.07)
Basophils Absolute: 0 10*3/uL (ref 0.0–0.1)
Basophils Relative: 0 %
Eosinophils Absolute: 0.1 10*3/uL (ref 0.0–0.5)
Eosinophils Relative: 2 %
HCT: 35.9 % — ABNORMAL LOW (ref 39.0–52.0)
Hemoglobin: 12.2 g/dL — ABNORMAL LOW (ref 13.0–17.0)
Immature Granulocytes: 0 %
Lymphocytes Relative: 5 %
Lymphs Abs: 0.3 10*3/uL — ABNORMAL LOW (ref 0.7–4.0)
MCH: 31.8 pg (ref 26.0–34.0)
MCHC: 34 g/dL (ref 30.0–36.0)
MCV: 93.5 fL (ref 80.0–100.0)
Monocytes Absolute: 0.3 10*3/uL (ref 0.1–1.0)
Monocytes Relative: 5 %
Neutro Abs: 4.5 10*3/uL (ref 1.7–7.7)
Neutrophils Relative %: 88 %
Platelet Count: 224 10*3/uL (ref 150–400)
RBC: 3.84 MIL/uL — ABNORMAL LOW (ref 4.22–5.81)
RDW: 12.8 % (ref 11.5–15.5)
WBC Count: 5.2 10*3/uL (ref 4.0–10.5)
nRBC: 0 % (ref 0.0–0.2)

## 2023-07-15 LAB — CMP (CANCER CENTER ONLY)
ALT: 24 U/L (ref 0–44)
AST: 21 U/L (ref 15–41)
Albumin: 3.8 g/dL (ref 3.5–5.0)
Alkaline Phosphatase: 66 U/L (ref 38–126)
Anion gap: 10 (ref 5–15)
BUN: 22 mg/dL (ref 8–23)
CO2: 26 mmol/L (ref 22–32)
Calcium: 9.5 mg/dL (ref 8.9–10.3)
Chloride: 102 mmol/L (ref 98–111)
Creatinine: 0.8 mg/dL (ref 0.61–1.24)
GFR, Estimated: >60 mL/min (ref >60)
Glucose, Bld: 86 mg/dL (ref 70–99)
Potassium: 4.3 mmol/L (ref 3.5–5.1)
Sodium: 138 mmol/L (ref 135–145)
Total Bilirubin: 0.5 mg/dL (ref 0.3–1.2)
Total Protein: 6.4 g/dL — ABNORMAL LOW (ref 6.5–8.1)

## 2023-07-15 LAB — HEPATITIS B SURFACE ANTIGEN: Hepatitis B Surface Ag: NONREACTIVE

## 2023-07-15 LAB — HEPATITIS B CORE ANTIBODY, TOTAL: Hep B Core Total Ab: NONREACTIVE

## 2023-07-15 LAB — URIC ACID: Uric Acid, Serum: 6.2 mg/dL (ref 3.7–8.6)

## 2023-07-15 LAB — LACTATE DEHYDROGENASE: LDH: 153 U/L (ref 98–192)

## 2023-07-15 MED ORDER — DIPHENHYDRAMINE HCL 25 MG PO CAPS
50.0000 mg | ORAL_CAPSULE | Freq: Once | ORAL | Status: AC
  Administered 2023-07-15: 50 mg via ORAL
  Filled 2023-07-15: qty 2

## 2023-07-15 MED ORDER — SODIUM CHLORIDE 0.9 % IV SOLN: Freq: Once | INTRAVENOUS | Status: AC

## 2023-07-15 MED ORDER — VINCRISTINE SULFATE CHEMO INJECTION 1 MG/ML
2.0000 mg | Freq: Once | INTRAVENOUS | Status: AC
  Administered 2023-07-15: 2 mg via INTRAVENOUS
  Filled 2023-07-15: qty 2

## 2023-07-15 MED ORDER — SODIUM CHLORIDE 0.9 % IV SOLN
10.0000 mg | Freq: Once | INTRAVENOUS | Status: AC
  Administered 2023-07-15: 10 mg via INTRAVENOUS
  Filled 2023-07-15: qty 10

## 2023-07-15 MED ORDER — PALONOSETRON HCL INJECTION 0.25 MG/5ML
0.2500 mg | Freq: Once | INTRAVENOUS | Status: AC
  Administered 2023-07-15: 0.25 mg via INTRAVENOUS
  Filled 2023-07-15: qty 5

## 2023-07-15 MED ORDER — ACETAMINOPHEN 325 MG PO TABS
650.0000 mg | ORAL_TABLET | Freq: Once | ORAL | Status: AC
  Administered 2023-07-15: 650 mg via ORAL
  Filled 2023-07-15: qty 2

## 2023-07-15 MED ORDER — SODIUM CHLORIDE 0.9 % IV SOLN
375.0000 mg/m2 | Freq: Once | INTRAVENOUS | Status: AC
  Administered 2023-07-15: 800 mg via INTRAVENOUS
  Filled 2023-07-15: qty 50

## 2023-07-15 MED ORDER — SODIUM CHLORIDE 0.9% FLUSH
10.0000 mL | INTRAVENOUS | Status: DC | PRN
  Administered 2023-07-15: 10 mL

## 2023-07-15 MED ORDER — SODIUM CHLORIDE 0.9% FLUSH
10.0000 mL | Freq: Once | INTRAVENOUS | Status: AC
  Administered 2023-07-15: 10 mL

## 2023-07-15 MED ORDER — HEPARIN SOD (PORK) LOCK FLUSH 100 UNIT/ML IV SOLN
500.0000 [IU] | Freq: Once | INTRAVENOUS | Status: AC | PRN
  Administered 2023-07-15: 500 [IU]

## 2023-07-15 MED ORDER — SODIUM CHLORIDE 0.9 % IV SOLN
750.0000 mg/m2 | Freq: Once | INTRAVENOUS | Status: AC
  Administered 2023-07-15: 1500 mg via INTRAVENOUS
  Filled 2023-07-15: qty 75

## 2023-07-15 MED ORDER — DOXORUBICIN HCL CHEMO IV INJECTION 2 MG/ML
50.0000 mg/m2 | Freq: Once | INTRAVENOUS | Status: AC
  Administered 2023-07-15: 106 mg via INTRAVENOUS
  Filled 2023-07-15: qty 53

## 2023-07-15 MED ORDER — SODIUM CHLORIDE 0.9 % IV SOLN
150.0000 mg | Freq: Once | INTRAVENOUS | Status: AC
  Administered 2023-07-15: 150 mg via INTRAVENOUS
  Filled 2023-07-15: qty 150

## 2023-07-15 NOTE — Progress Notes (Signed)
Per Dr. Leonides Schanz, OK to treat today with pending hepatitis B surface antigen and core antibody results.

## 2023-07-15 NOTE — Progress Notes (Unsigned)
Port site at [employer] inspected due to concern for skin tear. Dressing was removed from insertion at home and skin tore during removal. Patient reports he used antibiotic ointment and covered it with gauze dressings. Red line and scant amount of yellow crusting on the skin. No open wound or bleeding areas. Patient advised to leave area clean and dry, no need to use any further antibiotic ointment. Patient verbalized an understanding to watch for signs of infection. Port de-accessed without concerns.

## 2023-07-15 NOTE — Patient Instructions (Signed)
[facility]  Discharge Instructions: Thank you for choosing [facility] to provide your oncology and hematology care.   If you have a lab appointment with the Cancer Center, please go directly to the Cancer Center and check in at the registration area.   Wear comfortable clothing and clothing appropriate for easy access to any Portacath or PICC line.   We strive to give you quality time with your provider. You may need to reschedule your appointment if you arrive late (15 or more minutes).  Arriving late affects you and other patients whose appointments are after yours.  Also, if you miss three or more appointments without notifying the office, you may be dismissed from the clinic at the provider's discretion.      For prescription refill requests, have your pharmacy contact our office and allow 72 hours for refills to be completed.    Today you received the following chemotherapy and/or immunotherapy agents: Doxorubicin, Vincristine, Cyclophosphamide, Rituximab      To help prevent nausea and vomiting after your treatment, we encourage you to take your nausea medication as directed.  BELOW ARE SYMPTOMS THAT SHOULD BE REPORTED IMMEDIATELY: *FEVER GREATER THAN 100.4 F (38 C) OR HIGHER *CHILLS OR SWEATING *NAUSEA AND VOMITING THAT IS NOT CONTROLLED WITH YOUR NAUSEA MEDICATION *UNUSUAL SHORTNESS OF BREATH *UNUSUAL BRUISING OR BLEEDING *URINARY PROBLEMS (pain or burning when urinating, or frequent urination) *BOWEL PROBLEMS (unusual diarrhea, constipation, pain near the anus) TENDERNESS IN MOUTH AND THROAT WITH OR WITHOUT PRESENCE OF ULCERS (sore throat, sores in mouth, or a toothache) UNUSUAL RASH, SWELLING OR PAIN  UNUSUAL VAGINAL DISCHARGE OR ITCHING   Items with * indicate a potential emergency and should be followed up as soon as possible or go to the Emergency Department if any problems should occur.  Please show the CHEMOTHERAPY  ALERT CARD or IMMUNOTHERAPY ALERT CARD at check-in to the Emergency Department and triage nurse.  Should you have questions after your visit or need to cancel or reschedule your appointment, please contact [facility]  Dept: [phone]  and follow the prompts.  Office hours are 8:00 a.m. to 4:30 p.m. Monday - Friday. Please note that voicemails left after 4:00 p.m. may not be returned until the following business day.  We are closed weekends and major holidays. You have access to a nurse at all times for urgent questions. Please call the main number to the clinic Dept: [phone] and follow the prompts.   For any non-urgent questions, you may also contact your provider using MyChart. We now offer e-Visits for anyone 55 and older to request care online for non-urgent symptoms. For details visit [email or web address].   Also download the MyChart app! Go to the app store, search "MyChart", open the app, select [facility], and log in with your MyChart username and password.  Doxorubicin Injection What is this medication? DOXORUBICIN (dox oh ROO bi sin) treats some types of cancer. It works by slowing down the growth of cancer cells. This medicine may be used for other purposes; ask your health care provider or pharmacist if you have questions. COMMON BRAND NAME(S): Adriamycin, Adriamycin PFS, Adriamycin RDF, Rubex What should I tell my care team before I take this medication? They need to know if you have any of these conditions: Heart disease History of low blood cell levels caused by a medication Liver disease Recent or ongoing radiation An unusual or allergic reaction to doxorubicin,  other medications, foods, dyes, or preservatives If you or your partner are pregnant or trying to get pregnant Breast-feeding How should I use this medication? This medication is injected into a vein. It is given by your care team in a hospital or clinic setting. Talk  to your care team about the use of this medication in children. Special care may be needed. Overdosage: If you think you have taken too much of this medicine contact a poison control center or emergency room at once. NOTE: This medicine is only for you. Do not share this medicine with others. What if I miss a dose? Keep appointments for follow-up doses. It is important not to miss your dose. Call your care team if you are unable to keep an appointment. What may interact with this medication? 6-mercaptopurine Paclitaxel Phenytoin St. John's wort Trastuzumab Verapamil This list may not describe all possible interactions. Give your health care provider a list of all the medicines, herbs, non-prescription drugs, or dietary supplements you use. Also tell them if you smoke, drink alcohol, or use illegal drugs. Some items may interact with your medicine. What should I watch for while using this medication? Your condition will be monitored carefully while you are receiving this medication. You may need blood work while taking this medication. This medication may make you feel generally unwell. This is not uncommon as chemotherapy can affect healthy cells as well as cancer cells. Report any side effects. Continue your course of treatment even though you feel ill unless your care team tells you to stop. There is a maximum amount of this medication you should receive throughout your life. The amount depends on the medical condition being treated and your overall health. Your care team will watch how much of this medication you receive. Tell your care team if you have taken this medication before. Your urine may turn red for a few days after your dose. This is not blood. If your urine is dark or brown, call your care team. In some cases, you may be given additional medications to help with side effects. Follow all directions for their use. This medication may increase your risk of getting an infection. Call  your care team for advice if you get a fever, chills, sore throat, or other symptoms of a cold or flu. Do not treat yourself. Try to avoid being around people who are sick. This medication may increase your risk to bruise or bleed. Call your care team if you notice any unusual bleeding. Talk to your care team about your risk of cancer. You may be more at risk for certain types of cancers if you take this medication. Talk to your care team if you or your partner may be pregnant. Serious birth defects can occur if you take this medication during pregnancy and for 6 months after the last dose. Contraception is recommended while taking this medication and for 6 months after the last dose. Your care team can help you find the option that works for you. If your partner can get pregnant, use a condom while taking this medication and for 6 months after the last dose. Do not breastfeed while taking this medication. This medication may cause infertility. Talk to your care team if you are concerned about your fertility. What side effects may I notice from receiving this medication? Side effects that you should report to your care team as soon as possible: Allergic reactions--skin rash, itching, hives, swelling of the face, lips, tongue, or throat Heart failure--shortness  of breath, swelling of the ankles, feet, or hands, sudden weight gain, unusual weakness or fatigue Heart rhythm changes--fast or irregular heartbeat, dizziness, feeling faint or lightheaded, chest pain, trouble breathing Infection--fever, chills, cough, sore throat, wounds that don't heal, pain or trouble when passing urine, general feeling of discomfort or being unwell Low red blood cell level--unusual weakness or fatigue, dizziness, headache, trouble breathing Painful swelling, warmth, or redness of the skin, blisters or sores at the infusion site Unusual bruising or bleeding Side effects that usually do not require medical attention (report  to your care team if they continue or are bothersome): Diarrhea Hair loss Nausea Pain, redness, or swelling with sores inside the mouth or throat Red urine This list may not describe all possible side effects. Call your doctor for medical advice about side effects. You may report side effects to FDA at 1-800-FDA-1088. Where should I keep my medication? This medication is given in a hospital or clinic. It will not be stored at home. NOTE: This sheet is a summary. It may not cover all possible information. If you have questions about this medicine, talk to your doctor, pharmacist, or health care provider.  2024 Elsevier/Gold Standard (2023-03-10 00:00:00)  Vincristine Injection What is this medication? VINCRISTINE (vin KRIS teen) treats some types of cancer. It works by slowing down the growth of cancer cells. This medicine may be used for other purposes; ask your health care provider or pharmacist if you have questions. COMMON BRAND NAME(S): Oncovin, Vincasar PFS What should I tell my care team before I take this medication? They need to know if you have any of these conditions: Infection Kidney disease Liver disease Low white blood cell levels Lung disease Nervous system disease, such as Charcot-Marie-Tooth (CMT) Recent or ongoing radiation therapy An unusual or allergic reaction to vincristine, other chemotherapy agents, other medications, foods, dyes, or preservatives Pregnant or trying to get pregnant Breast-feeding How should I use this medication? This medication is infused into a vein. It is given by your care team in a hospital or clinic setting. Talk to your care team about the use of this medication in children. While it may be given to children for selected conditions, precautions do apply. Overdosage: If you think you have taken too much of this medicine contact a poison control center or emergency room at once. NOTE: This medicine is only for you. Do not share this  medicine with others. What if I miss a dose? Keep appointments for follow-up doses. It is important not to miss your dose. Call your care team if you are unable to keep an appointment. What may interact with this medication? Do not take this medication with any of the following: Live virus vaccines This medication may also interact with the following: Medications for fungal infections, such as itraconazole or fluconazole Phenytoin Supplements, such as St. John's wort This list may not describe all possible interactions. Give your health care provider a list of all the medicines, herbs, non-prescription drugs, or dietary supplements you use. Also tell them if you smoke, drink alcohol, or use illegal drugs. Some items may interact with your medicine. What should I watch for while using this medication? Your condition will be monitored carefully while you are receiving this medication. This medication may make you feel generally unwell. This is not uncommon as chemotherapy can affect healthy cells as well as cancer cells. Report any side effects. Continue your course of treatment even though you feel ill unless your care team tells you  to stop. You may need blood work while taking this medication. This medication may increase your risk to bruise or bleed. Call your care team if you notice any unusual bleeding. This medication may increase your risk of getting an infection. Call your care team for advice if you get a fever, chills, sore throat, or other symptoms of a cold or flu. Do not treat yourself. Try to avoid being around people who are sick. This medication will cause constipation. If you do not have a bowel movement for 3 days, call your care team. Call your care team if you are around anyone with measles, chickenpox, or if you develop sores or blisters that do not heal properly. Be careful brushing or flossing your teeth or using a toothpick because you may get an infection or bleed more  easily. If you have any dental work done, tell your dentist you are receiving this medication Talk to your care team if you or your partner wish to become pregnant or think either of you might be pregnant. This medication can cause serious birth defects. This medication may cause infertility. Talk to your care team if you are concerned about your fertility. Talk to your care team before breastfeeding. Changes to your treatment plan may be needed. What side effects may I notice from receiving this medication? Side effects that you should report to your care team as soon as possible: Allergic reactions--skin rash, itching, hives, swelling of the face, lips, tongue, or throat High uric acid level--severe pain, redness, warmth, or swelling in joints, pain or trouble passing urine, pain in the lower back or sides Infection--fever, chills, cough, sore throat, wounds that don't heal, pain or trouble when passing urine, general feeling of discomfort or being unwell Pain, tingling, or numbness in the hands or feet, muscle weakness, change in vision, confusion or trouble speaking, loss of balance or coordination, trouble walking, seizures Painful swelling, warmth, or redness of the skin, blisters or sores at the infusion site Shortness of breath or trouble breathing Side effects that usually do not require medical attention (report to your care team if they continue or are bothersome): Constipation Diarrhea Hair loss Loss of appetite Nausea Stomach cramping Vomiting This list may not describe all possible side effects. Call your doctor for medical advice about side effects. You may report side effects to FDA at 1-800-FDA-1088. Where should I keep my medication? This medication is given in a hospital or clinic. It will not be stored at home. NOTE: This sheet is a summary. It may not cover all possible information. If you have questions about this medicine, talk to your doctor, pharmacist, or health care  provider.  2024 Elsevier/Gold Standard (2022-03-02 00:00:00)  Cyclophosphamide Injection What is this medication? CYCLOPHOSPHAMIDE (sye kloe FOSS fa mide) treats some types of cancer. It works by slowing down the growth of cancer cells. This medicine may be used for other purposes; ask your health care provider or pharmacist if you have questions. COMMON BRAND NAME(S): Cyclophosphamide, Cytoxan, Neosar What should I tell my care team before I take this medication? They need to know if you have any of these conditions: Heart disease Irregular heartbeat or rhythm Infection Kidney problems Liver disease Low blood cell levels (white cells, platelets, or red blood cells) Lung disease Previous radiation Trouble passing urine An unusual or allergic reaction to cyclophosphamide, other medications, foods, dyes, or preservatives Pregnant or trying to get pregnant Breast-feeding How should I use this medication? This medication is injected into a  vein. It is given by your care team in a hospital or clinic setting. Talk to your care team about the use of this medication in children. Special care may be needed. Overdosage: If you think you have taken too much of this medicine contact a poison control center or emergency room at once. NOTE: This medicine is only for you. Do not share this medicine with others. What if I miss a dose? Keep appointments for follow-up doses. It is important not to miss your dose. Call your care team if you are unable to keep an appointment. What may interact with this medication? Amphotericin B Amiodarone Azathioprine Certain antivirals for HIV or hepatitis Certain medications for blood pressure, such as enalapril, lisinopril, quinapril Cyclosporine Diuretics Etanercept Indomethacin Medications that relax muscles Metronidazole Natalizumab Tamoxifen Warfarin This list may not describe all possible interactions. Give your health care provider a list of all  the medicines, herbs, non-prescription drugs, or dietary supplements you use. Also tell them if you smoke, drink alcohol, or use illegal drugs. Some items may interact with your medicine. What should I watch for while using this medication? This medication may make you feel generally unwell. This is not uncommon as chemotherapy can affect healthy cells as well as cancer cells. Report any side effects. Continue your course of treatment even though you feel ill unless your care team tells you to stop. You may need blood work while you are taking this medication. This medication may increase your risk of getting an infection. Call your care team for advice if you get a fever, chills, sore throat, or other symptoms of a cold or flu. Do not treat yourself. Try to avoid being around people who are sick. Avoid taking medications that contain aspirin, acetaminophen, ibuprofen, naproxen, or ketoprofen unless instructed by your care team. These medications may hide a fever. Be careful brushing or flossing your teeth or using a toothpick because you may get an infection or bleed more easily. If you have any dental work done, tell your dentist you are receiving this medication. Drink water or other fluids as directed. Urinate often, even at night. Some products may contain alcohol. Ask your care team if this medication contains alcohol. Be sure to tell all care teams you are taking this medicine. Certain medicines, like metronidazole and disulfiram, can cause an unpleasant reaction when taken with alcohol. The reaction includes flushing, headache, nausea, vomiting, sweating, and increased thirst. The reaction can last from 30 minutes to several hours. Talk to your care team if you wish to become pregnant or think you might be pregnant. This medication can cause serious birth defects if taken during pregnancy and for 1 year after the last dose. A negative pregnancy test is required before starting this medication. A  reliable form of contraception is recommended while taking this medication and for 1 year after the last dose. Talk to your care team about reliable forms of contraception. Do not father a child while taking this medication and for 4 months after the last dose. Use a condom during this time period. Do not breast-feed while taking this medication or for 1 week after the last dose. This medication may cause infertility. Talk to your care team if you are concerned about your fertility. Talk to your care team about your risk of cancer. You may be more at risk for certain types of cancer if you take this medication. What side effects may I notice from receiving this medication? Side effects that you should  report to your care team as soon as possible: Allergic reactions--skin rash, itching, hives, swelling of the face, lips, tongue, or throat Dry cough, shortness of breath or trouble breathing Heart failure--shortness of breath, swelling of the ankles, feet, or hands, sudden weight gain, unusual weakness or fatigue Heart muscle inflammation--unusual weakness or fatigue, shortness of breath, chest pain, fast or irregular heartbeat, dizziness, swelling of the ankles, feet, or hands Heart rhythm changes--fast or irregular heartbeat, dizziness, feeling faint or lightheaded, chest pain, trouble breathing Infection--fever, chills, cough, sore throat, wounds that don't heal, pain or trouble when passing urine, general feeling of discomfort or being unwell Kidney injury--decrease in the amount of urine, swelling of the ankles, hands, or feet Liver injury--right upper belly pain, loss of appetite, nausea, light-colored stool, dark yellow or brown urine, yellowing skin or eyes, unusual weakness or fatigue Low red blood cell level--unusual weakness or fatigue, dizziness, headache, trouble breathing Low sodium level--muscle weakness, fatigue, dizziness, headache, confusion Red or dark brown urine Unusual bruising or  bleeding Side effects that usually do not require medical attention (report to your care team if they continue or are bothersome): Hair loss Irregular menstrual cycles or spotting Loss of appetite Nausea Pain, redness, or swelling with sores inside the mouth or throat Vomiting This list may not describe all possible side effects. Call your doctor for medical advice about side effects. You may report side effects to FDA at 1-800-FDA-1088. Where should I keep my medication? This medication is given in a hospital or clinic. It will not be stored at home. NOTE: This sheet is a summary. It may not cover all possible information. If you have questions about this medicine, talk to your doctor, pharmacist, or health care provider.  2024 Elsevier/Gold Standard (2022-04-23 00:00:00)  Rituximab Injection What is this medication? RITUXIMAB (ri TUX i mab) treats leukemia and lymphoma. It works by blocking a protein that causes cancer cells to grow and multiply. This helps to slow or stop the spread of cancer cells. It may also be used to treat autoimmune conditions, such as arthritis. It works by slowing down an overactive immune system. It is a monoclonal antibody. This medicine may be used for other purposes; ask your health care provider or pharmacist if you have questions. COMMON BRAND NAME(S): RIABNI, Rituxan, RUXIENCE, truxima What should I tell my care team before I take this medication? They need to know if you have any of these conditions: Chest pain Heart disease Immune system problems Infection, such as chickenpox, cold sores, hepatitis B, herpes Irregular heartbeat or rhythm Kidney disease Low blood counts, such as low white cells, platelets, red cells Lung disease Recent or upcoming vaccine An unusual or allergic reaction to rituximab, other medications, foods, dyes, or preservatives Pregnant or trying to get pregnant Breast-feeding How should I use this medication? This medication  is injected into a vein. It is given by a care team in a hospital or clinic setting. A special MedGuide will be given to you before each treatment. Be sure to read this information carefully each time. Talk to your care team about the use of this medication in children. While this medication may be prescribed for children as young as 6 months for selected conditions, precautions do apply. Overdosage: If you think you have taken too much of this medicine contact a poison control center or emergency room at once. NOTE: This medicine is only for you. Do not share this medicine with others. What if I miss a dose? Keep  appointments for follow-up doses. It is important not to miss your dose. Call your care team if you are unable to keep an appointment. What may interact with this medication? Do not take this medication with any of the following: Live vaccines This medication may also interact with the following: Cisplatin This list may not describe all possible interactions. Give your health care provider a list of all the medicines, herbs, non-prescription drugs, or dietary supplements you use. Also tell them if you smoke, drink alcohol, or use illegal drugs. Some items may interact with your medicine. What should I watch for while using this medication? Your condition will be monitored carefully while you are receiving this medication. You may need blood work while taking this medication. This medication can cause serious infusion reactions. To reduce the risk your care team may give you other medications to take before receiving this one. Be sure to follow the directions from your care team. This medication may increase your risk of getting an infection. Call your care team for advice if you get a fever, chills, sore throat, or other symptoms of a cold or flu. Do not treat yourself. Try to avoid being around people who are sick. Call your care team if you are around anyone with measles, chickenpox, or  if you develop sores or blisters that do not heal properly. Avoid taking medications that contain aspirin, acetaminophen, ibuprofen, naproxen, or ketoprofen unless instructed by your care team. These medications may hide a fever. This medication may cause serious skin reactions. They can happen weeks to months after starting the medication. Contact your care team right away if you notice fevers or flu-like symptoms with a rash. The rash may be red or purple and then turn into blisters or peeling of the skin. You may also notice a red rash with swelling of the face, lips, or lymph nodes in your neck or under your arms. In some patients, this medication may cause a serious brain infection that may cause death. If you have any problems seeing, thinking, speaking, walking, or standing, tell your care team right away. If you cannot reach your care team, urgently seek another source of medical care. Talk to your care team if you may be pregnant. Serious birth defects can occur if you take this medication during pregnancy and for 12 months after the last dose. You will need a negative pregnancy test before starting this medication. Contraception is recommended while taking this medication and for 12 months after the last dose. Your care team can help you find the option that works for you. Do not breastfeed while taking this medication and for at least 6 months after the last dose. What side effects may I notice from receiving this medication? Side effects that you should report to your care team as soon as possible: Allergic reactions or angioedema--skin rash, itching or hives, swelling of the face, eyes, lips, tongue, arms, or legs, trouble swallowing or breathing Bowel blockage--stomach cramping, unable to have a bowel movement or pass gas, loss of appetite, vomiting Dizziness, loss of balance or coordination, confusion or trouble speaking Heart attack--pain or tightness in the chest, shoulders, arms, or jaw,  nausea, shortness of breath, cold or clammy skin, feeling faint or lightheaded Heart rhythm changes--fast or irregular heartbeat, dizziness, feeling faint or lightheaded, chest pain, trouble breathing Infection--fever, chills, cough, sore throat, wounds that don't heal, pain or trouble when passing urine, general feeling of discomfort or being unwell Infusion reactions--chest pain, shortness of breath or  trouble breathing, feeling faint or lightheaded Kidney injury--decrease in the amount of urine, swelling of the ankles, hands, or feet Liver injury--right upper belly pain, loss of appetite, nausea, light-colored stool, dark yellow or brown urine, yellowing skin or eyes, unusual weakness or fatigue Redness, blistering, peeling, or loosening of the skin, including inside the mouth Stomach pain that is severe, does not go away, or gets worse Tumor lysis syndrome (TLS)--nausea, vomiting, diarrhea, decrease in the amount of urine, dark urine, unusual weakness or fatigue, confusion, muscle pain or cramps, fast or irregular heartbeat, joint pain Side effects that usually do not require medical attention (report to your care team if they continue or are bothersome): Headache Joint pain Nausea Runny or stuffy nose Unusual weakness or fatigue This list may not describe all possible side effects. Call your doctor for medical advice about side effects. You may report side effects to FDA at 1-800-FDA-1088. Where should I keep my medication? This medication is given in a hospital or clinic. It will not be stored at home. NOTE: This sheet is a summary. It may not cover all possible information. If you have questions about this medicine, talk to your doctor, pharmacist, or health care provider.  2024 [employer] Standard (2022-04-29 00:00:00)

## 2023-07-16 ENCOUNTER — Encounter: Payer: Self-pay | Admitting: Hematology and Oncology

## 2023-07-18 ENCOUNTER — Inpatient Hospital Stay: Payer: Medicare Other

## 2023-07-18 ENCOUNTER — Other Ambulatory Visit: Payer: Self-pay

## 2023-07-18 VITALS — BP 112/78 | HR 62 | Resp 18

## 2023-07-18 DIAGNOSIS — C8333 Diffuse large B-cell lymphoma, intra-abdominal lymph nodes: Secondary | ICD-10-CM | POA: Diagnosis not present

## 2023-07-18 DIAGNOSIS — C8338 Diffuse large B-cell lymphoma, lymph nodes of multiple sites: Secondary | ICD-10-CM

## 2023-07-18 MED ORDER — PEGFILGRASTIM-CBQV 6 MG/0.6ML ~~LOC~~ SOSY
6.0000 mg | PREFILLED_SYRINGE | Freq: Once | SUBCUTANEOUS | Status: AC
  Administered 2023-07-18: 6 mg via SUBCUTANEOUS
  Filled 2023-07-18: qty 0.6

## 2023-07-18 NOTE — Patient Instructions (Signed)

## 2023-07-19 ENCOUNTER — Encounter: Payer: Self-pay | Admitting: Hematology and Oncology

## 2023-07-19 NOTE — Telephone Encounter (Signed)
-----   Message from Nurse Threasa Beards sent at 07/15/2023  4:43 PM EDT ----- Regarding: Dr Leonides Schanz pt, first time Doxorubicin/Vincristine/Rituximab/Cytoxan Dr Leonides Schanz pt came in 7/26 for first time Doxorubicin/Vincristine/Rituximab/Cytoxan. Tolerated infusions well. Needs call back.

## 2023-07-19 NOTE — Telephone Encounter (Signed)
Called & left message for pt pt return call to discuss how he did post chemo.

## 2023-07-22 ENCOUNTER — Telehealth: Payer: Self-pay | Admitting: *Deleted

## 2023-07-22 ENCOUNTER — Inpatient Hospital Stay: Payer: Medicare Other | Attending: Physician Assistant

## 2023-07-22 ENCOUNTER — Other Ambulatory Visit: Payer: Self-pay

## 2023-07-22 DIAGNOSIS — Z7952 Long term (current) use of systemic steroids: Secondary | ICD-10-CM | POA: Diagnosis not present

## 2023-07-22 DIAGNOSIS — Z86718 Personal history of other venous thrombosis and embolism: Secondary | ICD-10-CM | POA: Diagnosis not present

## 2023-07-22 DIAGNOSIS — Z79632 Long term (current) use of antitumor antibiotic: Secondary | ICD-10-CM | POA: Diagnosis not present

## 2023-07-22 DIAGNOSIS — K5730 Diverticulosis of large intestine without perforation or abscess without bleeding: Secondary | ICD-10-CM | POA: Diagnosis not present

## 2023-07-22 DIAGNOSIS — K8020 Calculus of gallbladder without cholecystitis without obstruction: Secondary | ICD-10-CM | POA: Diagnosis not present

## 2023-07-22 DIAGNOSIS — I252 Old myocardial infarction: Secondary | ICD-10-CM | POA: Diagnosis not present

## 2023-07-22 DIAGNOSIS — Z5112 Encounter for antineoplastic immunotherapy: Secondary | ICD-10-CM | POA: Diagnosis present

## 2023-07-22 DIAGNOSIS — Z95828 Presence of other vascular implants and grafts: Secondary | ICD-10-CM

## 2023-07-22 DIAGNOSIS — Z803 Family history of malignant neoplasm of breast: Secondary | ICD-10-CM | POA: Insufficient documentation

## 2023-07-22 DIAGNOSIS — Z79630 Long term (current) use of alkylating agent: Secondary | ICD-10-CM | POA: Insufficient documentation

## 2023-07-22 DIAGNOSIS — Z8546 Personal history of malignant neoplasm of prostate: Secondary | ICD-10-CM | POA: Diagnosis not present

## 2023-07-22 DIAGNOSIS — Z79620 Long term (current) use of immunosuppressive biologic: Secondary | ICD-10-CM | POA: Insufficient documentation

## 2023-07-22 DIAGNOSIS — Z87891 Personal history of nicotine dependence: Secondary | ICD-10-CM | POA: Insufficient documentation

## 2023-07-22 DIAGNOSIS — Z8249 Family history of ischemic heart disease and other diseases of the circulatory system: Secondary | ICD-10-CM | POA: Diagnosis not present

## 2023-07-22 DIAGNOSIS — Z79899 Other long term (current) drug therapy: Secondary | ICD-10-CM | POA: Insufficient documentation

## 2023-07-22 DIAGNOSIS — I700 Atherosclerosis of aorta: Secondary | ICD-10-CM | POA: Insufficient documentation

## 2023-07-22 DIAGNOSIS — E785 Hyperlipidemia, unspecified: Secondary | ICD-10-CM | POA: Insufficient documentation

## 2023-07-22 DIAGNOSIS — C8338 Diffuse large B-cell lymphoma, lymph nodes of multiple sites: Secondary | ICD-10-CM | POA: Insufficient documentation

## 2023-07-22 DIAGNOSIS — I2510 Atherosclerotic heart disease of native coronary artery without angina pectoris: Secondary | ICD-10-CM | POA: Insufficient documentation

## 2023-07-22 DIAGNOSIS — R5383 Other fatigue: Secondary | ICD-10-CM | POA: Insufficient documentation

## 2023-07-22 DIAGNOSIS — Z5189 Encounter for other specified aftercare: Secondary | ICD-10-CM | POA: Insufficient documentation

## 2023-07-22 DIAGNOSIS — Z5111 Encounter for antineoplastic chemotherapy: Secondary | ICD-10-CM | POA: Diagnosis present

## 2023-07-22 DIAGNOSIS — Z7901 Long term (current) use of anticoagulants: Secondary | ICD-10-CM | POA: Insufficient documentation

## 2023-07-22 DIAGNOSIS — Z79633 Long term (current) use of mitotic inhibitor: Secondary | ICD-10-CM | POA: Insufficient documentation

## 2023-07-22 LAB — CBC WITH DIFFERENTIAL (CANCER CENTER ONLY)
Abs Immature Granulocytes: 0 10*3/uL (ref 0.00–0.07)
Basophils Absolute: 0 10*3/uL (ref 0.0–0.1)
Basophils Relative: 1 %
Eosinophils Absolute: 0.2 10*3/uL (ref 0.0–0.5)
Eosinophils Relative: 31 %
HCT: 34.5 % — ABNORMAL LOW (ref 39.0–52.0)
Hemoglobin: 12 g/dL — ABNORMAL LOW (ref 13.0–17.0)
Immature Granulocytes: 0 %
Lymphocytes Relative: 14 %
Lymphs Abs: 0.1 10*3/uL — ABNORMAL LOW (ref 0.7–4.0)
MCH: 31.7 pg (ref 26.0–34.0)
MCHC: 34.8 g/dL (ref 30.0–36.0)
MCV: 91 fL (ref 80.0–100.0)
Monocytes Absolute: 0.1 10*3/uL (ref 0.1–1.0)
Monocytes Relative: 18 %
Neutro Abs: 0.3 10*3/uL — CL (ref 1.7–7.7)
Neutrophils Relative %: 36 %
Platelet Count: 165 10*3/uL (ref 150–400)
RBC: 3.79 MIL/uL — ABNORMAL LOW (ref 4.22–5.81)
RDW: 12.3 % (ref 11.5–15.5)
Smear Review: NORMAL
WBC Count: 0.7 10*3/uL — CL (ref 4.0–10.5)
nRBC: 0 % (ref 0.0–0.2)

## 2023-07-22 LAB — CMP (CANCER CENTER ONLY)
ALT: 25 U/L (ref 0–44)
AST: 12 U/L — ABNORMAL LOW (ref 15–41)
Albumin: 3.7 g/dL (ref 3.5–5.0)
Alkaline Phosphatase: 63 U/L (ref 38–126)
Anion gap: 7 (ref 5–15)
BUN: 18 mg/dL (ref 8–23)
CO2: 28 mmol/L (ref 22–32)
Calcium: 9 mg/dL (ref 8.9–10.3)
Chloride: 101 mmol/L (ref 98–111)
Creatinine: 0.76 mg/dL (ref 0.61–1.24)
GFR, Estimated: >60 mL/min (ref >60)
Glucose, Bld: 124 mg/dL — ABNORMAL HIGH (ref 70–99)
Potassium: 4.3 mmol/L (ref 3.5–5.1)
Sodium: 136 mmol/L (ref 135–145)
Total Bilirubin: 0.5 mg/dL (ref 0.3–1.2)
Total Protein: 6.3 g/dL — ABNORMAL LOW (ref 6.5–8.1)

## 2023-07-22 LAB — URIC ACID: Uric Acid, Serum: 4.2 mg/dL (ref 3.7–8.6)

## 2023-07-22 LAB — LACTATE DEHYDROGENASE: LDH: 112 U/L (ref 98–192)

## 2023-07-22 MED ORDER — HEPARIN SOD (PORK) LOCK FLUSH 100 UNIT/ML IV SOLN
500.0000 [IU] | Freq: Once | INTRAVENOUS | Status: AC
  Administered 2023-07-22: 500 [IU]

## 2023-07-22 MED ORDER — SODIUM CHLORIDE 0.9% FLUSH
10.0000 mL | Freq: Once | INTRAVENOUS | Status: AC
  Administered 2023-07-22: 10 mL

## 2023-07-22 NOTE — Telephone Encounter (Signed)
CRITICAL VALUE STICKER  CRITICAL VALUE:  WBC 0.7  ANC 0.3  RECEIVER (on-site recipient of call):  Binnie Rail, RN  DATE & TIME NOTIFIED:  07/22/23  12:23  MESSENGER (representative from lab): Melissa  MD NOTIFIED: Dr. Leonides Schanz  TIME OF NOTIFICATION: 12:35  RESPONSE:  acknowledged receipt of results

## 2023-07-25 ENCOUNTER — Other Ambulatory Visit: Payer: Self-pay

## 2023-07-27 ENCOUNTER — Other Ambulatory Visit: Payer: Self-pay

## 2023-07-28 ENCOUNTER — Other Ambulatory Visit: Payer: Self-pay | Admitting: Physician Assistant

## 2023-07-28 DIAGNOSIS — C8338 Diffuse large B-cell lymphoma, lymph nodes of multiple sites: Secondary | ICD-10-CM

## 2023-07-29 ENCOUNTER — Inpatient Hospital Stay: Payer: Medicare Other

## 2023-07-29 ENCOUNTER — Other Ambulatory Visit: Payer: Self-pay

## 2023-07-29 DIAGNOSIS — Z5112 Encounter for antineoplastic immunotherapy: Secondary | ICD-10-CM | POA: Diagnosis not present

## 2023-07-29 DIAGNOSIS — Z95828 Presence of other vascular implants and grafts: Secondary | ICD-10-CM

## 2023-07-29 DIAGNOSIS — C8338 Diffuse large B-cell lymphoma, lymph nodes of multiple sites: Secondary | ICD-10-CM

## 2023-07-29 LAB — CBC WITH DIFFERENTIAL (CANCER CENTER ONLY)
Abs Immature Granulocytes: 1.13 10*3/uL — ABNORMAL HIGH (ref 0.00–0.07)
Basophils Absolute: 0.1 10*3/uL (ref 0.0–0.1)
Basophils Relative: 1 %
Eosinophils Absolute: 0.2 10*3/uL (ref 0.0–0.5)
Eosinophils Relative: 2 %
HCT: 38.1 % — ABNORMAL LOW (ref 39.0–52.0)
Hemoglobin: 12.9 g/dL — ABNORMAL LOW (ref 13.0–17.0)
Immature Granulocytes: 10 %
Lymphocytes Relative: 3 %
Lymphs Abs: 0.4 10*3/uL — ABNORMAL LOW (ref 0.7–4.0)
MCH: 31.6 pg (ref 26.0–34.0)
MCHC: 33.9 g/dL (ref 30.0–36.0)
MCV: 93.4 fL (ref 80.0–100.0)
Monocytes Absolute: 1 10*3/uL (ref 0.1–1.0)
Monocytes Relative: 9 %
Neutro Abs: 8.5 10*3/uL — ABNORMAL HIGH (ref 1.7–7.7)
Neutrophils Relative %: 75 %
Platelet Count: 100 10*3/uL — ABNORMAL LOW (ref 150–400)
RBC: 4.08 MIL/uL — ABNORMAL LOW (ref 4.22–5.81)
RDW: 13.5 % (ref 11.5–15.5)
WBC Count: 11.4 10*3/uL — ABNORMAL HIGH (ref 4.0–10.5)
nRBC: 0.3 % — ABNORMAL HIGH (ref 0.0–0.2)

## 2023-07-29 LAB — CMP (CANCER CENTER ONLY)
ALT: 30 U/L (ref 0–44)
AST: 18 U/L (ref 15–41)
Albumin: 4 g/dL (ref 3.5–5.0)
Alkaline Phosphatase: 89 U/L (ref 38–126)
Anion gap: 8 (ref 5–15)
BUN: 17 mg/dL (ref 8–23)
CO2: 25 mmol/L (ref 22–32)
Calcium: 8.9 mg/dL (ref 8.9–10.3)
Chloride: 105 mmol/L (ref 98–111)
Creatinine: 0.84 mg/dL (ref 0.61–1.24)
GFR, Estimated: >60 mL/min (ref >60)
Glucose, Bld: 122 mg/dL — ABNORMAL HIGH (ref 70–99)
Potassium: 4 mmol/L (ref 3.5–5.1)
Sodium: 138 mmol/L (ref 135–145)
Total Bilirubin: 0.3 mg/dL (ref 0.3–1.2)
Total Protein: 7 g/dL (ref 6.5–8.1)

## 2023-07-29 LAB — LACTATE DEHYDROGENASE: LDH: 182 U/L (ref 98–192)

## 2023-07-29 LAB — URIC ACID: Uric Acid, Serum: 4.9 mg/dL (ref 3.7–8.6)

## 2023-07-29 MED ORDER — SODIUM CHLORIDE 0.9% FLUSH
10.0000 mL | Freq: Once | INTRAVENOUS | Status: AC
  Administered 2023-07-29: 10 mL

## 2023-07-29 MED ORDER — HEPARIN SOD (PORK) LOCK FLUSH 100 UNIT/ML IV SOLN
500.0000 [IU] | Freq: Once | INTRAVENOUS | Status: AC
  Administered 2023-07-29: 500 [IU]

## 2023-08-01 ENCOUNTER — Telehealth: Payer: Self-pay | Admitting: *Deleted

## 2023-08-01 NOTE — Telephone Encounter (Signed)
Received vm message from pt regarding a toothache he has developed. He has seen his dentist and is on amoxicillin.  He wanted to make sure it was ok to take the antibiotic.\  TCT patient and spoke with him. He states his tooth is feeling better but that he needs a root canal. This being done 08/04/23. His next treatment is scheduled for 08/05/23. Pt is on Eliquis. Advised to stop Eliquis 2 days prior to root canal-Tuesday, 08/02/23 and then resume Eliquis Thursday evening after the root canal is done. Pt voiced understanding.

## 2023-08-04 ENCOUNTER — Encounter: Payer: Self-pay | Admitting: Hematology and Oncology

## 2023-08-04 NOTE — Addendum Note (Signed)
Addended by: Wyvonnia Lora on: 08/04/2023 09:29 AM   Modules accepted: Orders

## 2023-08-04 NOTE — Progress Notes (Signed)
Called pt to introduce myself as his Dance movement psychotherapist and to discuss the [employer].  I went over what it covers but he declined it at this time so I will request the registration staff give him my card in case he changes his mind and for any questions or concerns he may have in the future.

## 2023-08-05 ENCOUNTER — Inpatient Hospital Stay: Payer: Medicare Other

## 2023-08-05 ENCOUNTER — Other Ambulatory Visit: Payer: Self-pay

## 2023-08-05 ENCOUNTER — Inpatient Hospital Stay: Payer: Medicare Other | Admitting: Physician Assistant

## 2023-08-05 VITALS — BP 90/63 | HR 81 | Temp 97.7°F | Resp 16

## 2023-08-05 VITALS — BP 102/70 | HR 74 | Temp 97.5°F | Resp 16 | Ht 71.0 in | Wt 187.2 lb

## 2023-08-05 DIAGNOSIS — C8338 Diffuse large B-cell lymphoma, lymph nodes of multiple sites: Secondary | ICD-10-CM

## 2023-08-05 DIAGNOSIS — Z5112 Encounter for antineoplastic immunotherapy: Secondary | ICD-10-CM | POA: Diagnosis not present

## 2023-08-05 DIAGNOSIS — Z95828 Presence of other vascular implants and grafts: Secondary | ICD-10-CM

## 2023-08-05 LAB — CMP (CANCER CENTER ONLY)
ALT: 20 U/L (ref 0–44)
AST: 16 U/L (ref 15–41)
Albumin: 4.1 g/dL (ref 3.5–5.0)
Alkaline Phosphatase: 80 U/L (ref 38–126)
Anion gap: 7 (ref 5–15)
BUN: 13 mg/dL (ref 8–23)
CO2: 25 mmol/L (ref 22–32)
Calcium: 8.8 mg/dL — ABNORMAL LOW (ref 8.9–10.3)
Chloride: 106 mmol/L (ref 98–111)
Creatinine: 0.83 mg/dL (ref 0.61–1.24)
GFR, Estimated: >60 mL/min (ref >60)
Glucose, Bld: 101 mg/dL — ABNORMAL HIGH (ref 70–99)
Potassium: 4.1 mmol/L (ref 3.5–5.1)
Sodium: 138 mmol/L (ref 135–145)
Total Bilirubin: 0.3 mg/dL (ref 0.3–1.2)
Total Protein: 7.4 g/dL (ref 6.5–8.1)

## 2023-08-05 LAB — CBC WITH DIFFERENTIAL (CANCER CENTER ONLY)
Abs Immature Granulocytes: 0.12 10*3/uL — ABNORMAL HIGH (ref 0.00–0.07)
Basophils Absolute: 0.1 10*3/uL (ref 0.0–0.1)
Basophils Relative: 2 %
Eosinophils Absolute: 0.2 10*3/uL (ref 0.0–0.5)
Eosinophils Relative: 3 %
HCT: 37.2 % — ABNORMAL LOW (ref 39.0–52.0)
Hemoglobin: 12.6 g/dL — ABNORMAL LOW (ref 13.0–17.0)
Immature Granulocytes: 2 %
Lymphocytes Relative: 6 %
Lymphs Abs: 0.4 10*3/uL — ABNORMAL LOW (ref 0.7–4.0)
MCH: 31.7 pg (ref 26.0–34.0)
MCHC: 33.9 g/dL (ref 30.0–36.0)
MCV: 93.5 fL (ref 80.0–100.0)
Monocytes Absolute: 0.5 10*3/uL (ref 0.1–1.0)
Monocytes Relative: 7 %
Neutro Abs: 5 10*3/uL (ref 1.7–7.7)
Neutrophils Relative %: 80 %
Platelet Count: 265 10*3/uL (ref 150–400)
RBC: 3.98 MIL/uL — ABNORMAL LOW (ref 4.22–5.81)
RDW: 13.6 % (ref 11.5–15.5)
WBC Count: 6.2 10*3/uL (ref 4.0–10.5)
nRBC: 0 % (ref 0.0–0.2)

## 2023-08-05 MED ORDER — SODIUM CHLORIDE 0.9 % IV SOLN
10.0000 mg | Freq: Once | INTRAVENOUS | Status: AC
  Administered 2023-08-05: 10 mg via INTRAVENOUS
  Filled 2023-08-05: qty 10

## 2023-08-05 MED ORDER — DIPHENHYDRAMINE HCL 25 MG PO CAPS
50.0000 mg | ORAL_CAPSULE | Freq: Once | ORAL | Status: AC
  Administered 2023-08-05: 50 mg via ORAL
  Filled 2023-08-05: qty 2

## 2023-08-05 MED ORDER — VINCRISTINE SULFATE CHEMO INJECTION 1 MG/ML
2.0000 mg | Freq: Once | INTRAVENOUS | Status: AC
  Administered 2023-08-05: 2 mg via INTRAVENOUS
  Filled 2023-08-05: qty 2

## 2023-08-05 MED ORDER — SODIUM CHLORIDE 0.9 % IV SOLN: Freq: Once | INTRAVENOUS | Status: AC

## 2023-08-05 MED ORDER — SODIUM CHLORIDE 0.9 % IV SOLN
750.0000 mg/m2 | Freq: Once | INTRAVENOUS | Status: AC
  Administered 2023-08-05: 1500 mg via INTRAVENOUS
  Filled 2023-08-05: qty 75

## 2023-08-05 MED ORDER — DOXORUBICIN HCL CHEMO IV INJECTION 2 MG/ML
50.0000 mg/m2 | Freq: Once | INTRAVENOUS | Status: AC
  Administered 2023-08-05: 106 mg via INTRAVENOUS
  Filled 2023-08-05: qty 53

## 2023-08-05 MED ORDER — SODIUM CHLORIDE 0.9% FLUSH
10.0000 mL | INTRAVENOUS | Status: DC | PRN
  Administered 2023-08-05: 10 mL

## 2023-08-05 MED ORDER — HEPARIN SOD (PORK) LOCK FLUSH 100 UNIT/ML IV SOLN
500.0000 [IU] | Freq: Once | INTRAVENOUS | Status: AC | PRN
  Administered 2023-08-05: 500 [IU]

## 2023-08-05 MED ORDER — PALONOSETRON HCL INJECTION 0.25 MG/5ML
0.2500 mg | Freq: Once | INTRAVENOUS | Status: AC
  Administered 2023-08-05: 0.25 mg via INTRAVENOUS
  Filled 2023-08-05: qty 5

## 2023-08-05 MED ORDER — SODIUM CHLORIDE 0.9 % IV SOLN
375.0000 mg/m2 | Freq: Once | INTRAVENOUS | Status: AC
  Administered 2023-08-05: 800 mg via INTRAVENOUS
  Filled 2023-08-05: qty 50

## 2023-08-05 MED ORDER — SODIUM CHLORIDE 0.9% FLUSH
10.0000 mL | Freq: Once | INTRAVENOUS | Status: AC
  Administered 2023-08-05: 10 mL

## 2023-08-05 MED ORDER — SODIUM CHLORIDE 0.9 % IV SOLN
150.0000 mg | Freq: Once | INTRAVENOUS | Status: AC
  Administered 2023-08-05: 150 mg via INTRAVENOUS
  Filled 2023-08-05: qty 150

## 2023-08-05 MED ORDER — ACETAMINOPHEN 325 MG PO TABS
650.0000 mg | ORAL_TABLET | Freq: Once | ORAL | Status: AC
  Administered 2023-08-05: 650 mg via ORAL
  Filled 2023-08-05: qty 2

## 2023-08-05 NOTE — Patient Instructions (Signed)
 [facility]  Discharge Instructions: Thank you for choosing [facility] to provide your oncology and hematology care.   If you have a lab appointment with the Cancer Center, please go directly to the Cancer Center and check in at the registration area.   Wear comfortable clothing and clothing appropriate for easy access to any Portacath or PICC line.   We strive to give you quality time with your provider. You may need to reschedule your appointment if you arrive late (15 or more minutes).  Arriving late affects you and other patients whose appointments are after yours.  Also, if you miss three or more appointments without notifying the office, you may be dismissed from the clinic at the provider's discretion.      For prescription refill requests, have your pharmacy contact our office and allow 72 hours for refills to be completed.    Today you received the following chemotherapy and/or immunotherapy agents: Doxorubicin, Vincristine, Cyclophosphamide, Rituximab      To help prevent nausea and vomiting after your treatment, we encourage you to take your nausea medication as directed.  BELOW ARE SYMPTOMS THAT SHOULD BE REPORTED IMMEDIATELY: *FEVER GREATER THAN 100.4 F (38 C) OR HIGHER *CHILLS OR SWEATING *NAUSEA AND VOMITING THAT IS NOT CONTROLLED WITH YOUR NAUSEA MEDICATION *UNUSUAL SHORTNESS OF BREATH *UNUSUAL BRUISING OR BLEEDING *URINARY PROBLEMS (pain or burning when urinating, or frequent urination) *BOWEL PROBLEMS (unusual diarrhea, constipation, pain near the anus) TENDERNESS IN MOUTH AND THROAT WITH OR WITHOUT PRESENCE OF ULCERS (sore throat, sores in mouth, or a toothache) UNUSUAL RASH, SWELLING OR PAIN  UNUSUAL VAGINAL DISCHARGE OR ITCHING   Items with * indicate a potential emergency and should be followed up as soon as possible or go to the Emergency Department if any problems should occur.  Please show the CHEMOTHERAPY  ALERT CARD or IMMUNOTHERAPY ALERT CARD at check-in to the Emergency Department and triage nurse.  Should you have questions after your visit or need to cancel or reschedule your appointment, please contact [facility]  Dept: [phone]  and follow the prompts.  Office hours are 8:00 a.m. to 4:30 p.m. Monday - Friday. Please note that voicemails left after 4:00 p.m. may not be returned until the following business day.  We are closed weekends and major holidays. You have access to a nurse at all times for urgent questions. Please call the main number to the clinic Dept: [phone] and follow the prompts.   For any non-urgent questions, you may also contact your provider using MyChart. We now offer e-Visits for anyone 55 and older to request care online for non-urgent symptoms. For details visit [email or web address].   Also download the MyChart app! Go to the app store, search "MyChart", open the app, select [facility], and log in with your MyChart username and password.  Doxorubicin Injection What is this medication? DOXORUBICIN (dox oh ROO bi sin) treats some types of cancer. It works by slowing down the growth of cancer cells. This medicine may be used for other purposes; ask your health care provider or pharmacist if you have questions. COMMON BRAND NAME(S): Adriamycin, Adriamycin PFS, Adriamycin RDF, Rubex What should I tell my care team before I take this medication? They need to know if you have any of these conditions: Heart disease History of low blood cell levels caused by a medication Liver disease Recent or ongoing radiation An unusual or allergic reaction to doxorubicin,  other medications, foods, dyes, or preservatives If you or your partner are pregnant or trying to get pregnant Breast-feeding How should I use this medication? This medication is injected into a vein. It is given by your care team in a hospital or clinic setting. Talk  to your care team about the use of this medication in children. Special care may be needed. Overdosage: If you think you have taken too much of this medicine contact a poison control center or emergency room at once. NOTE: This medicine is only for you. Do not share this medicine with others. What if I miss a dose? Keep appointments for follow-up doses. It is important not to miss your dose. Call your care team if you are unable to keep an appointment. What may interact with this medication? 6-mercaptopurine Paclitaxel Phenytoin St. John's wort Trastuzumab Verapamil This list may not describe all possible interactions. Give your health care provider a list of all the medicines, herbs, non-prescription drugs, or dietary supplements you use. Also tell them if you smoke, drink alcohol, or use illegal drugs. Some items may interact with your medicine. What should I watch for while using this medication? Your condition will be monitored carefully while you are receiving this medication. You may need blood work while taking this medication. This medication may make you feel generally unwell. This is not uncommon as chemotherapy can affect healthy cells as well as cancer cells. Report any side effects. Continue your course of treatment even though you feel ill unless your care team tells you to stop. There is a maximum amount of this medication you should receive throughout your life. The amount depends on the medical condition being treated and your overall health. Your care team will watch how much of this medication you receive. Tell your care team if you have taken this medication before. Your urine may turn red for a few days after your dose. This is not blood. If your urine is dark or brown, call your care team. In some cases, you may be given additional medications to help with side effects. Follow all directions for their use. This medication may increase your risk of getting an infection. Call  your care team for advice if you get a fever, chills, sore throat, or other symptoms of a cold or flu. Do not treat yourself. Try to avoid being around people who are sick. This medication may increase your risk to bruise or bleed. Call your care team if you notice any unusual bleeding. Talk to your care team about your risk of cancer. You may be more at risk for certain types of cancers if you take this medication. Talk to your care team if you or your partner may be pregnant. Serious birth defects can occur if you take this medication during pregnancy and for 6 months after the last dose. Contraception is recommended while taking this medication and for 6 months after the last dose. Your care team can help you find the option that works for you. If your partner can get pregnant, use a condom while taking this medication and for 6 months after the last dose. Do not breastfeed while taking this medication. This medication may cause infertility. Talk to your care team if you are concerned about your fertility. What side effects may I notice from receiving this medication? Side effects that you should report to your care team as soon as possible: Allergic reactions--skin rash, itching, hives, swelling of the face, lips, tongue, or throat Heart failure--shortness  of breath, swelling of the ankles, feet, or hands, sudden weight gain, unusual weakness or fatigue Heart rhythm changes--fast or irregular heartbeat, dizziness, feeling faint or lightheaded, chest pain, trouble breathing Infection--fever, chills, cough, sore throat, wounds that don't heal, pain or trouble when passing urine, general feeling of discomfort or being unwell Low red blood cell level--unusual weakness or fatigue, dizziness, headache, trouble breathing Painful swelling, warmth, or redness of the skin, blisters or sores at the infusion site Unusual bruising or bleeding Side effects that usually do not require medical attention (report  to your care team if they continue or are bothersome): Diarrhea Hair loss Nausea Pain, redness, or swelling with sores inside the mouth or throat Red urine This list may not describe all possible side effects. Call your doctor for medical advice about side effects. You may report side effects to FDA at 1-800-FDA-1088. Where should I keep my medication? This medication is given in a hospital or clinic. It will not be stored at home. NOTE: This sheet is a summary. It may not cover all possible information. If you have questions about this medicine, talk to your doctor, pharmacist, or health care provider.  2024 Elsevier/Gold Standard (2023-03-10 00:00:00)  Vincristine Injection What is this medication? VINCRISTINE (vin KRIS teen) treats some types of cancer. It works by slowing down the growth of cancer cells. This medicine may be used for other purposes; ask your health care provider or pharmacist if you have questions. COMMON BRAND NAME(S): Oncovin, Vincasar PFS What should I tell my care team before I take this medication? They need to know if you have any of these conditions: Infection Kidney disease Liver disease Low white blood cell levels Lung disease Nervous system disease, such as Charcot-Marie-Tooth (CMT) Recent or ongoing radiation therapy An unusual or allergic reaction to vincristine, other chemotherapy agents, other medications, foods, dyes, or preservatives Pregnant or trying to get pregnant Breast-feeding How should I use this medication? This medication is infused into a vein. It is given by your care team in a hospital or clinic setting. Talk to your care team about the use of this medication in children. While it may be given to children for selected conditions, precautions do apply. Overdosage: If you think you have taken too much of this medicine contact a poison control center or emergency room at once. NOTE: This medicine is only for you. Do not share this  medicine with others. What if I miss a dose? Keep appointments for follow-up doses. It is important not to miss your dose. Call your care team if you are unable to keep an appointment. What may interact with this medication? Do not take this medication with any of the following: Live virus vaccines This medication may also interact with the following: Medications for fungal infections, such as itraconazole or fluconazole Phenytoin Supplements, such as St. John's wort This list may not describe all possible interactions. Give your health care provider a list of all the medicines, herbs, non-prescription drugs, or dietary supplements you use. Also tell them if you smoke, drink alcohol, or use illegal drugs. Some items may interact with your medicine. What should I watch for while using this medication? Your condition will be monitored carefully while you are receiving this medication. This medication may make you feel generally unwell. This is not uncommon as chemotherapy can affect healthy cells as well as cancer cells. Report any side effects. Continue your course of treatment even though you feel ill unless your care team tells you  to stop. You may need blood work while taking this medication. This medication may increase your risk to bruise or bleed. Call your care team if you notice any unusual bleeding. This medication may increase your risk of getting an infection. Call your care team for advice if you get a fever, chills, sore throat, or other symptoms of a cold or flu. Do not treat yourself. Try to avoid being around people who are sick. This medication will cause constipation. If you do not have a bowel movement for 3 days, call your care team. Call your care team if you are around anyone with measles, chickenpox, or if you develop sores or blisters that do not heal properly. Be careful brushing or flossing your teeth or using a toothpick because you may get an infection or bleed more  easily. If you have any dental work done, tell your dentist you are receiving this medication Talk to your care team if you or your partner wish to become pregnant or think either of you might be pregnant. This medication can cause serious birth defects. This medication may cause infertility. Talk to your care team if you are concerned about your fertility. Talk to your care team before breastfeeding. Changes to your treatment plan may be needed. What side effects may I notice from receiving this medication? Side effects that you should report to your care team as soon as possible: Allergic reactions--skin rash, itching, hives, swelling of the face, lips, tongue, or throat High uric acid level--severe pain, redness, warmth, or swelling in joints, pain or trouble passing urine, pain in the lower back or sides Infection--fever, chills, cough, sore throat, wounds that don't heal, pain or trouble when passing urine, general feeling of discomfort or being unwell Pain, tingling, or numbness in the hands or feet, muscle weakness, change in vision, confusion or trouble speaking, loss of balance or coordination, trouble walking, seizures Painful swelling, warmth, or redness of the skin, blisters or sores at the infusion site Shortness of breath or trouble breathing Side effects that usually do not require medical attention (report to your care team if they continue or are bothersome): Constipation Diarrhea Hair loss Loss of appetite Nausea Stomach cramping Vomiting This list may not describe all possible side effects. Call your doctor for medical advice about side effects. You may report side effects to FDA at 1-800-FDA-1088. Where should I keep my medication? This medication is given in a hospital or clinic. It will not be stored at home. NOTE: This sheet is a summary. It may not cover all possible information. If you have questions about this medicine, talk to your doctor, pharmacist, or health care  provider.  2024 Elsevier/Gold Standard (2022-03-02 00:00:00)  Cyclophosphamide Injection What is this medication? CYCLOPHOSPHAMIDE (sye kloe FOSS fa mide) treats some types of cancer. It works by slowing down the growth of cancer cells. This medicine may be used for other purposes; ask your health care provider or pharmacist if you have questions. COMMON BRAND NAME(S): Cyclophosphamide, Cytoxan, Neosar What should I tell my care team before I take this medication? They need to know if you have any of these conditions: Heart disease Irregular heartbeat or rhythm Infection Kidney problems Liver disease Low blood cell levels (white cells, platelets, or red blood cells) Lung disease Previous radiation Trouble passing urine An unusual or allergic reaction to cyclophosphamide, other medications, foods, dyes, or preservatives Pregnant or trying to get pregnant Breast-feeding How should I use this medication? This medication is injected into a  vein. It is given by your care team in a hospital or clinic setting. Talk to your care team about the use of this medication in children. Special care may be needed. Overdosage: If you think you have taken too much of this medicine contact a poison control center or emergency room at once. NOTE: This medicine is only for you. Do not share this medicine with others. What if I miss a dose? Keep appointments for follow-up doses. It is important not to miss your dose. Call your care team if you are unable to keep an appointment. What may interact with this medication? Amphotericin B Amiodarone Azathioprine Certain antivirals for HIV or hepatitis Certain medications for blood pressure, such as enalapril, lisinopril, quinapril Cyclosporine Diuretics Etanercept Indomethacin Medications that relax muscles Metronidazole Natalizumab Tamoxifen Warfarin This list may not describe all possible interactions. Give your health care provider a list of all  the medicines, herbs, non-prescription drugs, or dietary supplements you use. Also tell them if you smoke, drink alcohol, or use illegal drugs. Some items may interact with your medicine. What should I watch for while using this medication? This medication may make you feel generally unwell. This is not uncommon as chemotherapy can affect healthy cells as well as cancer cells. Report any side effects. Continue your course of treatment even though you feel ill unless your care team tells you to stop. You may need blood work while you are taking this medication. This medication may increase your risk of getting an infection. Call your care team for advice if you get a fever, chills, sore throat, or other symptoms of a cold or flu. Do not treat yourself. Try to avoid being around people who are sick. Avoid taking medications that contain aspirin, acetaminophen, ibuprofen, naproxen, or ketoprofen unless instructed by your care team. These medications may hide a fever. Be careful brushing or flossing your teeth or using a toothpick because you may get an infection or bleed more easily. If you have any dental work done, tell your dentist you are receiving this medication. Drink water or other fluids as directed. Urinate often, even at night. Some products may contain alcohol. Ask your care team if this medication contains alcohol. Be sure to tell all care teams you are taking this medicine. Certain medicines, like metronidazole and disulfiram, can cause an unpleasant reaction when taken with alcohol. The reaction includes flushing, headache, nausea, vomiting, sweating, and increased thirst. The reaction can last from 30 minutes to several hours. Talk to your care team if you wish to become pregnant or think you might be pregnant. This medication can cause serious birth defects if taken during pregnancy and for 1 year after the last dose. A negative pregnancy test is required before starting this medication. A  reliable form of contraception is recommended while taking this medication and for 1 year after the last dose. Talk to your care team about reliable forms of contraception. Do not father a child while taking this medication and for 4 months after the last dose. Use a condom during this time period. Do not breast-feed while taking this medication or for 1 week after the last dose. This medication may cause infertility. Talk to your care team if you are concerned about your fertility. Talk to your care team about your risk of cancer. You may be more at risk for certain types of cancer if you take this medication. What side effects may I notice from receiving this medication? Side effects that you should  report to your care team as soon as possible: Allergic reactions--skin rash, itching, hives, swelling of the face, lips, tongue, or throat Dry cough, shortness of breath or trouble breathing Heart failure--shortness of breath, swelling of the ankles, feet, or hands, sudden weight gain, unusual weakness or fatigue Heart muscle inflammation--unusual weakness or fatigue, shortness of breath, chest pain, fast or irregular heartbeat, dizziness, swelling of the ankles, feet, or hands Heart rhythm changes--fast or irregular heartbeat, dizziness, feeling faint or lightheaded, chest pain, trouble breathing Infection--fever, chills, cough, sore throat, wounds that don't heal, pain or trouble when passing urine, general feeling of discomfort or being unwell Kidney injury--decrease in the amount of urine, swelling of the ankles, hands, or feet Liver injury--right upper belly pain, loss of appetite, nausea, light-colored stool, dark yellow or brown urine, yellowing skin or eyes, unusual weakness or fatigue Low red blood cell level--unusual weakness or fatigue, dizziness, headache, trouble breathing Low sodium level--muscle weakness, fatigue, dizziness, headache, confusion Red or dark brown urine Unusual bruising or  bleeding Side effects that usually do not require medical attention (report to your care team if they continue or are bothersome): Hair loss Irregular menstrual cycles or spotting Loss of appetite Nausea Pain, redness, or swelling with sores inside the mouth or throat Vomiting This list may not describe all possible side effects. Call your doctor for medical advice about side effects. You may report side effects to FDA at 1-800-FDA-1088. Where should I keep my medication? This medication is given in a hospital or clinic. It will not be stored at home. NOTE: This sheet is a summary. It may not cover all possible information. If you have questions about this medicine, talk to your doctor, pharmacist, or health care provider.  2024 Elsevier/Gold Standard (2022-04-23 00:00:00)  Rituximab Injection What is this medication? RITUXIMAB (ri TUX i mab) treats leukemia and lymphoma. It works by blocking a protein that causes cancer cells to grow and multiply. This helps to slow or stop the spread of cancer cells. It may also be used to treat autoimmune conditions, such as arthritis. It works by slowing down an overactive immune system. It is a monoclonal antibody. This medicine may be used for other purposes; ask your health care provider or pharmacist if you have questions. COMMON BRAND NAME(S): RIABNI, Rituxan, RUXIENCE, truxima What should I tell my care team before I take this medication? They need to know if you have any of these conditions: Chest pain Heart disease Immune system problems Infection, such as chickenpox, cold sores, hepatitis B, herpes Irregular heartbeat or rhythm Kidney disease Low blood counts, such as low white cells, platelets, red cells Lung disease Recent or upcoming vaccine An unusual or allergic reaction to rituximab, other medications, foods, dyes, or preservatives Pregnant or trying to get pregnant Breast-feeding How should I use this medication? This medication  is injected into a vein. It is given by a care team in a hospital or clinic setting. A special MedGuide will be given to you before each treatment. Be sure to read this information carefully each time. Talk to your care team about the use of this medication in children. While this medication may be prescribed for children as young as 6 months for selected conditions, precautions do apply. Overdosage: If you think you have taken too much of this medicine contact a poison control center or emergency room at once. NOTE: This medicine is only for you. Do not share this medicine with others. What if I miss a dose? Keep  appointments for follow-up doses. It is important not to miss your dose. Call your care team if you are unable to keep an appointment. What may interact with this medication? Do not take this medication with any of the following: Live vaccines This medication may also interact with the following: Cisplatin This list may not describe all possible interactions. Give your health care provider a list of all the medicines, herbs, non-prescription drugs, or dietary supplements you use. Also tell them if you smoke, drink alcohol, or use illegal drugs. Some items may interact with your medicine. What should I watch for while using this medication? Your condition will be monitored carefully while you are receiving this medication. You may need blood work while taking this medication. This medication can cause serious infusion reactions. To reduce the risk your care team may give you other medications to take before receiving this one. Be sure to follow the directions from your care team. This medication may increase your risk of getting an infection. Call your care team for advice if you get a fever, chills, sore throat, or other symptoms of a cold or flu. Do not treat yourself. Try to avoid being around people who are sick. Call your care team if you are around anyone with measles, chickenpox, or  if you develop sores or blisters that do not heal properly. Avoid taking medications that contain aspirin, acetaminophen, ibuprofen, naproxen, or ketoprofen unless instructed by your care team. These medications may hide a fever. This medication may cause serious skin reactions. They can happen weeks to months after starting the medication. Contact your care team right away if you notice fevers or flu-like symptoms with a rash. The rash may be red or purple and then turn into blisters or peeling of the skin. You may also notice a red rash with swelling of the face, lips, or lymph nodes in your neck or under your arms. In some patients, this medication may cause a serious brain infection that may cause death. If you have any problems seeing, thinking, speaking, walking, or standing, tell your care team right away. If you cannot reach your care team, urgently seek another source of medical care. Talk to your care team if you may be pregnant. Serious birth defects can occur if you take this medication during pregnancy and for 12 months after the last dose. You will need a negative pregnancy test before starting this medication. Contraception is recommended while taking this medication and for 12 months after the last dose. Your care team can help you find the option that works for you. Do not breastfeed while taking this medication and for at least 6 months after the last dose. What side effects may I notice from receiving this medication? Side effects that you should report to your care team as soon as possible: Allergic reactions or angioedema--skin rash, itching or hives, swelling of the face, eyes, lips, tongue, arms, or legs, trouble swallowing or breathing Bowel blockage--stomach cramping, unable to have a bowel movement or pass gas, loss of appetite, vomiting Dizziness, loss of balance or coordination, confusion or trouble speaking Heart attack--pain or tightness in the chest, shoulders, arms, or jaw,  nausea, shortness of breath, cold or clammy skin, feeling faint or lightheaded Heart rhythm changes--fast or irregular heartbeat, dizziness, feeling faint or lightheaded, chest pain, trouble breathing Infection--fever, chills, cough, sore throat, wounds that don't heal, pain or trouble when passing urine, general feeling of discomfort or being unwell Infusion reactions--chest pain, shortness of breath or  trouble breathing, feeling faint or lightheaded Kidney injury--decrease in the amount of urine, swelling of the ankles, hands, or feet Liver injury--right upper belly pain, loss of appetite, nausea, light-colored stool, dark yellow or brown urine, yellowing skin or eyes, unusual weakness or fatigue Redness, blistering, peeling, or loosening of the skin, including inside the mouth Stomach pain that is severe, does not go away, or gets worse Tumor lysis syndrome (TLS)--nausea, vomiting, diarrhea, decrease in the amount of urine, dark urine, unusual weakness or fatigue, confusion, muscle pain or cramps, fast or irregular heartbeat, joint pain Side effects that usually do not require medical attention (report to your care team if they continue or are bothersome): Headache Joint pain Nausea Runny or stuffy nose Unusual weakness or fatigue This list may not describe all possible side effects. Call your doctor for medical advice about side effects. You may report side effects to FDA at 1-800-FDA-1088. Where should I keep my medication? This medication is given in a hospital or clinic. It will not be stored at home. NOTE: This sheet is a summary. It may not cover all possible information. If you have questions about this medicine, talk to your doctor, pharmacist, or health care provider.  2024 [employer] (2022-04-29 00:00:00)

## 2023-08-05 NOTE — Progress Notes (Signed)
[facility] [phone]   [phone]  PROGRESS NOTE  Patient Care Team: Orpha Bur, MD as PCP - General (Family Medicine) Quintella Reichert, MD as PCP - Cardiology (Cardiology) Crist Fat, MD as Attending Physician (Urology) Margaretmary Dys, MD as Consulting Physician (Radiation Oncology) Maryclare Labrador, RN as Registered Nurse Jaci Standard, MD as Consulting Physician (Hematology and Oncology)  Hematological/Oncological History # Diffuse Large B Cell Lymphoma, Stage III Presented to PCP with swelling and redness in his right leg x 3 weeks. 06/13/2023: Doppler US showed acute DVT involving right  common femoral vein, SF junction, right femoral vein, right proximal profunda vein, and external iliac vein. Incident finding included pelvic lesion with minimum vascularity anterior to the prostate, measuring 11.8 x 9.6 12.6 cm, with a volume of 749.3 cubic centimeters. Prior MR on 04/11/2021 reports 1.7 x .07 x 1.1 cm category 4 lesion. This lesion appears to be causing extrinsic compression on the external iliac vein (proximal external iliac vein not visualized due to extrinsic compression).  06/16/2023: CT angio abdomen/pelvis: Large lobulated mass in the right pelvis encasing the right iliac vessels and abutting the anterior pelvic wall as well as right iliacus muscle and right bladder dome. This mass consistent with a neoplastic process and features most suggestive of lymphoma.Multiple splenic lesions most consistent with metastatic disease.Short segment thick wall small bowel in the mid abdomen to the right of midline most consistent with infiltrative malignancy/lymphoma. Right pelvic sidewall mass/adenopathy.Cholelithiasis. Colonic diverticulosis. Aortic Atherosclerosis. 06/21/2023: Establish care with [facility].  06/24/2023: US guided biopsy of abdominal mass. Pathology is consistent with B-cell lymphoma with a high proliferation rate of  80-90%. Morphologically the findings are most consistent with  a diffuse large B-cell lymphoma. FISH studies r/o Burkitt's lymphoma or double hit lymphoma. 07/15/2023: Cycle 1 Day 1 of R-CHOP chemotherapy.  08/05/2023: Cycle 2 Day 1 of R-CHOP chemotherapy.   Interval History:  Eugene Moreno 77 y.o. male with medical history significant for diffuse large B-cell lymphoma who presents for a follow up visit. The patient's last visit was on 07/15/2023. In the interim, he started R-CHOP chemotherapy. He presents today prior to Cycle 2, Day 1. He is accompanied by his wife for this visit.   On exam today Eugene Moreno reports he tolerating the first treatment without any significant side effects. He did experience some fatigue but he continues to complete his ADLs on his own. His appetite is stable but he did loose 8 lbs since last visit. He adds that his swelling in his right leg has resolved. He denies nausea, vomiting or bowel habit changes. He denies easy bruising or signs of active bleeding. He denies fevers, chills, sweats, shortness of breath, chest pain, cough, headaches, dizziness or peripheral neuropathy. Overall he is willing and able to proceed with chemotherapy today.  A full 10 point ROS is otherwise negative.  MEDICAL HISTORY:  Past Medical History:  Diagnosis Date   Coronary artery disease    cardiologist--- dr t. turner;  hx AWMI 09-06-2001  s/p cath with PCI and DES x1 to prox LAD;  last nuclear study 05-10-2016 in epic, anterior scar without ishcemia, nuclear ef 44%)   History of MI (myocardial infarction) 09/06/2001   anterior wall   Hyperlipemia    Hypertension    Leukopenia    chronic mild   Prostate cancer [facility])    urologist-- dr Marlou Porch--- dx 05/ 2022, Gleason 4+3, PSA 8.04   S/P drug  eluting coronary stent placement 09/06/2001   x1 to prox LAD   Wears glasses    Wears hearing aid in both ears     SURGICAL HISTORY: Past Surgical History:  Procedure Laterality Date    ANTERIOR CERVICAL DECOMP/DISCECTOMY FUSION  01/26/2002   @[facility]  by dr Jeral Fruit;   C6-C7   COLONOSCOPY  2020   CORONARY ANGIOPLASTY WITH STENT PLACEMENT  09/06/2001   @[facility]  by dr s. 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;  Service: Urology;  Laterality: N/A;   WISDOM TOOTH EXTRACTION      SOCIAL HISTORY: Social History   Socioeconomic History   Marital status: Married    Spouse name: Not on file   Number of children: Not on file   Years of education: Not on file   Highest education level: Not on file  Occupational History   Occupation: retired    Associate Professor: [employer]    Comment: Systems analyst  Tobacco Use   Smoking status: Former    Current packs/day: 0.00    Types: Cigarettes    Start date: 1965    Quit date: 1969    Years since quitting: 55.6   Smokeless tobacco: Never  Vaping Use   Vaping status: Never Used  Substance and Sexual Activity   Alcohol use: Yes    Comment: occasional   Drug use: Never   Sexual activity: Yes    Comment: vasectomy  Other Topics Concern   Not on file  Social History Narrative   Not on file   Social Determinants of Health   Financial Resource Strain: Not on file  Food Insecurity: No Food Insecurity (06/21/2023)   Hunger Vital Sign    Worried About Running Out of Food in the Last Year: Never true    Ran Out of Food in the Last Year: Never true  Transportation Needs: No Transportation Needs (06/21/2023)   PRAPARE - Scientist, research (physical sciences) (Medical): No    Lack of Transportation (Non-Medical): No  Physical Activity: Not on file  Stress: Not on file  Social Connections: Not on file  Intimate Partner Violence: Not At Risk (06/21/2023)   Humiliation, Afraid, Rape, and Kick questionnaire    Fear of Current or Ex-Partner: No    Emotionally Abused: No    Physically Abused: No    Sexually Abused: No    FAMILY HISTORY: Family History  Problem Relation Age of Onset   Breast cancer Mother 51   Heart attack Father        x3 between ages 44-64   Other Sister 71       angioplasty    ALLERGIES:  is allergic to niacin and related.  MEDICATIONS:  Current Outpatient Medications  Medication Sig Dispense Refill   allopurinol (ZYLOPRIM) 300 MG tablet Take 1 tablet (300 mg total) by mouth daily. 90 tablet 1   apixaban (ELIQUIS) 5 MG TABS tablet Take 1 tablet (5 mg total) by mouth 2 (two) times daily. Start taking  after completion of starter pack. 60 tablet 5   aspirin EC 81 MG tablet Take 2 tablets (162 mg total) by mouth daily.     Cod Liver Oil CAPS Take 2 capsules by mouth daily.     ferrous fumarate (HEMOCYTE - 106 MG FE) 325 (106 FE) MG TABS Take 1 tablet by mouth daily.     fluticasone (FLONASE) 50 MCG/ACT nasal spray Place 1 spray into both nostrils 2 (two) times daily.     folic acid (FOLVITE) 800 MCG tablet Take 800 mcg by mouth daily.      icosapent Ethyl (VASCEPA) 1 g capsule Take 2 capsules (2 g total) by mouth 2 (two) times daily. 120 capsule 8   lidocaine-prilocaine (EMLA) cream Apply 1 Application topically as needed. 30 g 0   ondansetron (ZOFRAN) 8 MG tablet Take 1 tablet (8 mg total) by mouth every 8 (eight) hours as needed. 30 tablet 0   predniSONE (DELTASONE) 20 MG tablet Take 3 tablets (60 mg total) by mouth daily with breakfast. 15 tablet 5   prochlorperazine (COMPAZINE) 10 MG tablet Take 1 tablet (10 mg total) by mouth every 6 (six) hours as needed for nausea or vomiting. 30 tablet 0   ramipril  (ALTACE) 2.5 MG capsule TAKE 1 CAPSULE BY MOUTH EVERY DAY 90 capsule 3   rosuvastatin (CRESTOR) 10 MG tablet TAKE 1 TABLET BY MOUTH EVERY DAY 90 tablet 3   vitamin C (ASCORBIC ACID) 500 MG tablet Take 1,000 mg by mouth daily.      vitamin E 400 UNIT capsule Take 400 Units by mouth daily.     APIXABAN (ELIQUIS) VTE STARTER PACK (10MG  AND 5MG ) Take as directed on package: start with two-5mg  tablets twice daily for 7 days. On day 8, switch to one-5mg  tablet twice daily. 74 each 0   Cetirizine HCl (ZYRTEC ALLERGY) 10 MG CAPS Take 1 capsule by mouth daily.     enoxaparin (LOVENOX) 100 MG/ML injection Inject 0.9 mLs (90 mg total) into the skin every 12 (twelve) hours for 2 days. Hold injection 12 hours before port placement 3.6 mL 0   No current facility-administered medications for this visit.    REVIEW OF SYSTEMS:   Constitutional: ( - ) fevers, ( - )  chills , ( - ) night sweats Eyes: ( - ) blurriness of vision, ( - ) double vision, ( - ) watery eyes Ears, nose, mouth, throat, and face: ( - ) mucositis, ( - ) sore throat Respiratory: ( - ) cough, ( - ) dyspnea, ( - ) wheezes Cardiovascular: ( - ) palpitation, ( - ) chest discomfort, ( - ) lower extremity swelling Gastrointestinal:  ( - ) nausea, ( - ) heartburn, ( - ) change in bowel habits Skin: ( - ) abnormal skin rashes Lymphatics: ( - ) new lymphadenopathy, ( - ) easy bruising Neurological: ( - ) numbness, ( - ) tingling, ( - ) new weaknesses Behavioral/Psych: ( - ) mood change, ( - ) new changes  All other systems were reviewed with the patient and are negative.  PHYSICAL EXAMINATION: ECOG PERFORMANCE STATUS: 1 - Symptomatic but completely ambulatory  Vitals:   08/05/23 0937  BP: 102/70  Pulse: 74  Resp: 16  Temp: (!) 97.5 F (36.4 C)  SpO2: 99%   Filed Weights   08/05/23 0937  Weight: 187 lb 3.2 oz (84.9 kg)    GENERAL: Well-appearing elderly Caucasian male, alert, no distress and comfortable SKIN: skin color, texture,  turgor are normal, no rashes or significant lesions EYES: conjunctiva are pink and non-injected, sclera clear LUNGS: clear to auscultation and percussion with normal breathing effort HEART: regular rate & rhythm and no murmurs and no lower extremity edema Musculoskeletal: no cyanosis of digits and no clubbing  PSYCH: alert & oriented x 3, fluent speech NEURO: no focal motor/sensory deficits  LABORATORY DATA:  I have reviewed the data as listed    Latest Ref Rng & Units 08/05/2023    8:48 AM 07/29/2023    1:49 PM 07/22/2023   11:53 AM  CBC  WBC 4.0 - 10.5 K/uL 6.2  11.4  0.7   Hemoglobin 13.0 - 17.0 g/dL 13.2  44.0  10.2   Hematocrit 39.0 - 52.0 % 37.2  38.1  34.5   Platelets 150 - 400 K/uL 265  100  165        Latest Ref Rng & Units 08/05/2023    8:48 AM 07/29/2023    1:49 PM 07/22/2023   11:53 AM  CMP  Glucose 70 - 99 mg/dL 725  366  440   BUN 8 - 23 mg/dL 13  17  18    Creatinine 0.61 - 1.24 mg/dL 3.47  4.25  9.56   Sodium 135 - 145 mmol/L 138  138  136   Potassium 3.5 - 5.1 mmol/L 4.1  4.0  4.3   Chloride 98 - 111 mmol/L 106  105  101   CO2 22 - 32 mmol/L 25  25  28    Calcium 8.9 - 10.3 mg/dL 8.8  8.9  9.0   Total Protein 6.5 - 8.1 g/dL 7.4  7.0  6.3   Total Bilirubin 0.3 - 1.2 mg/dL 0.3  0.3  0.5   Alkaline Phos 38 - 126 U/L 80  89  63   AST 15 - 41 U/L 16  18  12    ALT 0 - 44 U/L 20  30  25      Lab Results  Component Value Date   MPROTEIN Not Observed 11/05/2016   Lab Results  Component Value Date   KPAFRELGTCHN 2.46 (H) 11/03/2015   KPAFRELGTCHN 2.86 (H) 09/16/2011   KPAFRELGTCHN 2.86 (H) 09/16/2011   LAMBDASER 1.71 11/03/2015   LAMBDASER 2.65 (H) 09/16/2011   LAMBDASER 2.65 (H) 09/16/2011   KAPLAMBRATIO 1.25 11/05/2016   KAPLAMBRATIO 1.44 11/03/2015   KAPLAMBRATIO 1.08 09/16/2011   KAPLAMBRATIO 1.08 09/16/2011    RADIOGRAPHIC STUDIES: IR IMAGING GUIDED PORT INSERTION  Result Date: 07/11/2023 INDICATION: New diagnosis of lymphoma.  Port for chemo EXAM:  IMPLANTED PORT A CATH PLACEMENT WITH ULTRASOUND AND FLUOROSCOPIC GUIDANCE MEDICATIONS: None ANESTHESIA/SEDATION: Moderate (conscious) sedation was employed during this procedure. A total of Versed 4 mg and Fentanyl 100 mcg was administered intravenously. Moderate Sedation Time: 18 minutes. The patient's level of consciousness and vital signs were monitored continuously by radiology nursing throughout the procedure under my direct supervision. FLUOROSCOPY TIME:  Fluoroscopic dose; 0 mGy COMPLICATIONS: None immediate. PROCEDURE: The procedure, risks, benefits, and alternatives were explained to the patient. Questions regarding the procedure were encouraged and answered. The patient understands and consents to the procedure. The RIGHT neck and chest were prepped with chlorhexidine in a sterile fashion, and a sterile drape was applied covering the operative field. Maximum barrier sterile technique with sterile gowns and gloves were used for the procedure. A timeout was performed prior to the initiation of the procedure. Local anesthesia was provided with 1% lidocaine with epinephrine. After creating a small venotomy incision,  a micropuncture kit was utilized to access the internal jugular vein under direct, real-time ultrasound guidance. Ultrasound image documentation was performed. The microwire was kinked to measure appropriate catheter length. A subcutaneous port pocket was then created along the upper chest wall utilizing a combination of sharp and blunt dissection. The pocket was irrigated with sterile saline. A single lumen Non-ISP power injectable port was chosen for placement. The 8 Fr catheter was tunneled from the port pocket site to the venotomy incision. The port was placed in the pocket. The external catheter was trimmed to appropriate length. At the venotomy, an 8 Fr peel-away sheath was placed over a guidewire under fluoroscopic guidance. The catheter was then placed through the sheath and the sheath was  removed. Final catheter positioning was confirmed and documented with a fluoroscopic spot radiograph. The port was accessed with a Huber needle, aspirated and flushed with heparinized saline. The port pocket incision was closed with interrupted 3-0 Vicryl suture then Dermabond was applied, including at the venotomy incision. Dressings were placed. The patient tolerated the procedure well without immediate post procedural complication. IMPRESSION: Successful placement of a RIGHT internal jugular approach power injectable Port-A-Cath. The tip of the catheter is positioned within the proximal RIGHT atrium. The catheter is ready for immediate use. Roanna Banning, MD [facility] Electronically Signed   By: Roanna Banning M.D.   On: 07/11/2023 15:38   NM PET Image Initial (PI) Skull Base To Thigh  Result Date: 07/07/2023 CLINICAL DATA:  Initial treatment strategy for B-cell lymphoma. EXAM: NUCLEAR MEDICINE PET SKULL BASE TO THIGH TECHNIQUE: 10.89 mCi F-18 FDG was injected intravenously. Full-ring PET imaging was performed from the skull base to thigh after the radiotracer. CT data was obtained and used for attenuation correction and anatomic localization. Fasting blood glucose: 83 mg/dl COMPARISON:  None Available. FINDINGS: Mediastinal blood pool activity: SUV max 1.59 Liver activity: SUV max NA NECK: No hypermetabolic lymph nodes in the neck. Incidental CT findings: None. CHEST: High right paratracheal lymph node at the level of the thoracic inlet measures 1.6 cm with SUV max of 32.47, image 45/4. Tiny node measuring 4 mm within the right axilla has an SUV max of 5.15, image 57/4. No tracer avid pulmonary nodules. Incidental CT findings: Aortic atherosclerosis and coronary artery calcifications. 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 ABDOMEN/PELVIS: Tracer avid peripancreatic lymph node measures 2.1 cm  with SUV max of 30.16, image 90/4. Multiple tracer avid mesenteric lymph nodes are identified. The largest lymph node is in the right abdominal mesentery measuring 5.2 x 3.3 cm with SUV max of 35, image 118/4. multiple small left retroperitoneal lymph nodes scratch set several small tracer avid retroperitoneal lymph nodes are also noted including 5 mm left periaortic node with SUV max of 16.93. Multiple tracer avid splenic lesions are identified. The largest measures 3.5 cm with SUV max of 23.99, image 92/4. Short segment tracer avid circumferential wall thickening involving a loop of small bowel within the right hemiabdomen has an SUV max of 30.42, image 111/4. There is a large tracer avid nodal mass arising from the right external iliac nodal chain measuring 13.9 x 12.1 cm with an SUV max of 34.34, image 144/4. This extends along the right inguinal canal, image 152/4. There is no abnormal uptake identified within the liver, pancreas, or adrenal glands. No abnormal tracer avid kidney lesions identified. Incidental CT findings: Aortic atherosclerosis. Colonic diverticulosis without signs of acute diverticulitis. Seed implants  within the prostate gland. There is asymmetric soft tissue edema and enlargement of the right lower extremity which I suspect reflects mass effect upon the right external iliac vasculature by the large nodal mass described above. Spleen measures 13 cm in length. SKELETON: No focal hypermetabolic activity to suggest skeletal metastasis. Incidental CT findings: None. IMPRESSION: 1. There is a large tracer avid nodal mass arising from the right external iliac nodal chain measuring 13.9 x 12.1 cm with an SUV max of 34.34. This extends along the right inguinal canal. 2. Multiple tracer avid lymph nodes are identified within the chest, abdomen and pelvis compatible with lymphoma. 3. Multiple tracer avid splenic lesions are identified compatible with lymphoma. 4. Short segment tracer avid  circumferential wall thickening involving a loop of small bowel within the right hemiabdomen is compatible with lymphoma. 5. Asymmetric soft tissue edema and enlargement of the right lower extremity is identified which I suspect reflects mass effect upon the right external iliac vasculature by the large nodal mass described above. 6. Coronary artery calcifications. 7. 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 8.  Aortic Atherosclerosis (ICD10-I70.0). Electronically Signed   By: Signa Kell M.D.   On: 07/07/2023 10:32    ASSESSMENT & PLAN Eugene Moreno is a 77 y.o.. male with medical history significant for diffuse large B-cell lymphoma who presents for a follow up visit.   #Diffuse Large B-cell Lymphoma, Stage III --Underwent US guided biopsy of pelvic mass on 06/24/2023.  Pathology is consistent with B-cell lymphoma with a high proliferation rate of 80-90%. Morphologically the findings are most consistent with a diffuse large B-cell lymphoma. FISH studies r/o Burkitt's lymphoma or double hit lymphoma. --Pre treatment PET CT scan on 07/06/2023 showed right external iliac nodal chain measuring 13.9 x 12.1 cm with an SUV max of 34.34. Multiple tracer avid lymph nodes are identified within the chest, abdomen and pelvis compatible with lymphoma. Consistent with Stage III disease.  PLAN: --Due for Cycle 2, Day 1 of R-CHOP chemotherapy. --Labs today show white blood cell 6.2, hemoglobin 12.6, MCV 93.5, and platelets of 265 --Proceed with treatment today without any dose modifications.  --Plan for interval PET CT scan after Cycle 3.  --RTC in 3 weeks for labs, follow up before Cycle 3, Day 1.    #H/O DVT involving right  --Diagnosed 06/12/2022, secondary to underlying lymphoma --Currently on Eliquis 5 mg twice daily  #Supportive Care -- chemotherapy education complete -- port placed -- zofran 8mg  q8H PRN and compazine 10mg  PO q6H for nausea -- allopurinol  300mg  PO daily for TLS prophylaxis -- EMLA cream for port -- no pain medication required at this time.    Orders Placed This Encounter  Procedures   NM PET Image Restag (PS) Skull Base To Thigh    Standing Status:   Future    Standing Expiration Date:   08/04/2024    Scheduling Instructions:     Schedule the week of September 16th.    Order Specific Question:   If indicated for the ordered procedure, I authorize the administration of a radiopharmaceutical per Radiology protocol    Answer:   Yes    Order Specific Question:   Preferred imaging location?    Answer:   [place]   CBC with Differential (Cancer Center Only)    Standing Status:   Future    Standing Expiration Date:   08/25/2024   CMP (Cancer Center only)    Standing Status:   Future  Standing Expiration Date:   08/25/2024   CBC with Differential (Cancer Center Only)    Standing Status:   Future    Standing Expiration Date:   09/15/2024   CMP (Cancer Center only)    Standing Status:   Future    Standing Expiration Date:   09/15/2024   CBC with Differential (Cancer Center Only)    Standing Status:   Future    Standing Expiration Date:   10/06/2024   CMP (Cancer Center only)    Standing Status:   Future    Standing Expiration Date:   10/06/2024    All questions were answered. The patient knows to call the clinic with any problems, questions or concerns.  I have spent a total of 30 minutes minutes of face-to-face and non-face-to-face time, preparing to see the patient,performing a medically appropriate examination, counseling and educating the patient, ordering tests/procedures, documenting clinical information in the electronic health record, independently interpreting results and communicating results to the patient, and care coordination.   Georga Kaufmann PA-C Dept of Hematology and Oncology [facility] Phone: [phone]   08/05/2023 10:37 AM

## 2023-08-07 ENCOUNTER — Other Ambulatory Visit: Payer: Self-pay

## 2023-08-08 ENCOUNTER — Inpatient Hospital Stay: Payer: Medicare Other

## 2023-08-08 VITALS — BP 115/76 | HR 61 | Resp 16

## 2023-08-08 DIAGNOSIS — C8338 Diffuse large B-cell lymphoma, lymph nodes of multiple sites: Secondary | ICD-10-CM

## 2023-08-08 DIAGNOSIS — Z5112 Encounter for antineoplastic immunotherapy: Secondary | ICD-10-CM | POA: Diagnosis not present

## 2023-08-08 MED ORDER — PEGFILGRASTIM-CBQV 6 MG/0.6ML ~~LOC~~ SOSY
6.0000 mg | PREFILLED_SYRINGE | Freq: Once | SUBCUTANEOUS | Status: AC
  Administered 2023-08-08: 6 mg via SUBCUTANEOUS
  Filled 2023-08-08: qty 0.6

## 2023-08-12 ENCOUNTER — Other Ambulatory Visit: Payer: Self-pay

## 2023-08-17 ENCOUNTER — Encounter (HOSPITAL_BASED_OUTPATIENT_CLINIC_OR_DEPARTMENT_OTHER): Payer: Self-pay

## 2023-08-17 ENCOUNTER — Emergency Department (HOSPITAL_BASED_OUTPATIENT_CLINIC_OR_DEPARTMENT_OTHER)
Admission: EM | Admit: 2023-08-17 | Discharge: 2023-08-18 | Disposition: A | Discharge status: Home / Self Care | Payer: Medicare Other | Attending: Emergency Medicine | Admitting: Emergency Medicine

## 2023-08-17 ENCOUNTER — Emergency Department (HOSPITAL_BASED_OUTPATIENT_CLINIC_OR_DEPARTMENT_OTHER): Payer: Medicare Other

## 2023-08-17 DIAGNOSIS — I2510 Atherosclerotic heart disease of native coronary artery without angina pectoris: Secondary | ICD-10-CM | POA: Diagnosis not present

## 2023-08-17 DIAGNOSIS — Z8572 Personal history of non-Hodgkin lymphomas: Secondary | ICD-10-CM | POA: Diagnosis not present

## 2023-08-17 DIAGNOSIS — R1031 Right lower quadrant pain: Secondary | ICD-10-CM | POA: Diagnosis not present

## 2023-08-17 DIAGNOSIS — K529 Noninfective gastroenteritis and colitis, unspecified: Secondary | ICD-10-CM

## 2023-08-17 HISTORY — DX: Non-Hodgkin lymphoma, unspecified, unspecified site: C85.90

## 2023-08-17 LAB — URINALYSIS, ROUTINE W REFLEX MICROSCOPIC
Bilirubin Urine: NEGATIVE
Glucose, UA: NEGATIVE mg/dL
Hgb urine dipstick: NEGATIVE
Ketones, ur: 15 mg/dL — AB
Leukocytes,Ua: NEGATIVE
Nitrite: NEGATIVE
Protein, ur: NEGATIVE mg/dL
Specific Gravity, Urine: >=1.03 (ref 1.005–1.030)
pH: 5.5 (ref 5.0–8.0)

## 2023-08-17 LAB — CBC WITH DIFFERENTIAL/PLATELET
Abs Immature Granulocytes: 1.78 10*3/uL — ABNORMAL HIGH (ref 0.00–0.07)
Basophils Absolute: 0.1 10*3/uL (ref 0.0–0.1)
Basophils Relative: 0 %
Eosinophils Absolute: 0.1 10*3/uL (ref 0.0–0.5)
Eosinophils Relative: 1 %
HCT: 37.4 % — ABNORMAL LOW (ref 39.0–52.0)
Hemoglobin: 12.3 g/dL — ABNORMAL LOW (ref 13.0–17.0)
Immature Granulocytes: 12 %
Lymphocytes Relative: 2 %
Lymphs Abs: 0.3 10*3/uL — ABNORMAL LOW (ref 0.7–4.0)
MCH: 31.3 pg (ref 26.0–34.0)
MCHC: 32.9 g/dL (ref 30.0–36.0)
MCV: 95.2 fL (ref 80.0–100.0)
Monocytes Absolute: 0.8 10*3/uL (ref 0.1–1.0)
Monocytes Relative: 5 %
Neutro Abs: 11.4 10*3/uL — ABNORMAL HIGH (ref 1.7–7.7)
Neutrophils Relative %: 80 %
Platelets: 127 10*3/uL — ABNORMAL LOW (ref 150–400)
RBC: 3.93 MIL/uL — ABNORMAL LOW (ref 4.22–5.81)
RDW: 14.8 % (ref 11.5–15.5)
WBC: 14.9 10*3/uL — ABNORMAL HIGH (ref 4.0–10.5)
nRBC: 0.4 % — ABNORMAL HIGH (ref 0.0–0.2)

## 2023-08-17 LAB — COMPREHENSIVE METABOLIC PANEL
ALT: 23 U/L (ref 0–44)
AST: 21 U/L (ref 15–41)
Albumin: 3.6 g/dL (ref 3.5–5.0)
Alkaline Phosphatase: 74 U/L (ref 38–126)
Anion gap: 10 (ref 5–15)
BUN: 15 mg/dL (ref 8–23)
CO2: 21 mmol/L — ABNORMAL LOW (ref 22–32)
Calcium: 8.2 mg/dL — ABNORMAL LOW (ref 8.9–10.3)
Chloride: 104 mmol/L (ref 98–111)
Creatinine, Ser: 0.74 mg/dL (ref 0.61–1.24)
GFR, Estimated: >60 mL/min (ref >60)
Glucose, Bld: 118 mg/dL — ABNORMAL HIGH (ref 70–99)
Potassium: 4 mmol/L (ref 3.5–5.1)
Sodium: 135 mmol/L (ref 135–145)
Total Bilirubin: 0.5 mg/dL (ref 0.3–1.2)
Total Protein: 6.7 g/dL (ref 6.5–8.1)

## 2023-08-17 LAB — LIPASE, BLOOD: Lipase: 26 U/L (ref 11–51)

## 2023-08-17 MED ORDER — SODIUM CHLORIDE 0.9 % IV BOLUS
1000.0000 mL | Freq: Once | INTRAVENOUS | Status: AC
  Administered 2023-08-17: 1000 mL via INTRAVENOUS

## 2023-08-17 MED ORDER — MORPHINE SULFATE (PF) 4 MG/ML IV SOLN
4.0000 mg | Freq: Once | INTRAVENOUS | Status: AC
  Administered 2023-08-17: 4 mg via INTRAVENOUS
  Filled 2023-08-17: qty 1

## 2023-08-17 MED ORDER — ONDANSETRON HCL 4 MG/2ML IJ SOLN
4.0000 mg | Freq: Once | INTRAMUSCULAR | Status: AC
  Administered 2023-08-17: 4 mg via INTRAVENOUS
  Filled 2023-08-17: qty 2

## 2023-08-17 MED ORDER — IOHEXOL 350 MG/ML SOLN
100.0000 mL | Freq: Once | INTRAVENOUS | Status: AC | PRN
  Administered 2023-08-17: 100 mL via INTRAVENOUS

## 2023-08-17 NOTE — ED Notes (Signed)
Pt is aware urine is needed 

## 2023-08-17 NOTE — ED Triage Notes (Addendum)
Pt states he is having rt. LLQ pain since 1 pm this afternoon.  +nausea, no vomiting  Pt is currently receiving chemo for lymphoma

## 2023-08-17 NOTE — ED Provider Notes (Signed)
[place] [facility] AT [facility] Provider Note   CSN: [id number] Arrival date & time: 08/17/23  1909     History  Chief Complaint  Patient presents with   Abdominal Pain    Eugene Moreno is a 77 y.o. male.   Abdominal Pain 77 year old male history of recently diagnosed Hodgkin's lymphoma , presented today ED due to concerns of right lower quadrant abdominal pain that  he describes a 10 out of 10.  Says the pain does not radiate anywhere at this time.. today and has gotten worse for the past few hours.  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 Authorizing Provider  allopurinol (ZYLOPRIM) 300 MG tablet Take 1 tablet (300 mg total) by mouth daily. 07/07/23   Jaci Standard, MD  amoxicillin (AMOXIL) 500 MG capsule Take 500 mg by mouth 3 (three) times daily.    [provider]  apixaban (ELIQUIS) 5 MG TABS tablet Take 1 tablet (5 mg total) by mouth 2 (two) times daily. Start taking after completion of starter pack. 06/13/23   Pervis Hocking B, RPH-CPP  APIXABAN Everlene Balls) VTE STARTER PACK (10MG  AND 5MG ) Take as directed on package: start with two-5mg  tablets twice daily for 7 days. On day 8, switch to one-5mg  tablet twice daily. 06/13/23   Pervis Hocking B, RPH-CPP  aspirin EC 81 MG tablet Take 2 tablets (162 mg total) by mouth daily. 10/18/11   Laurey Morale, MD  Cetirizine HCl (ZYRTEC ALLERGY) 10 MG CAPS Take 1 capsule by mouth daily.    [provider]  [facility] Liver Oil CAPS Take 2 capsules by mouth daily.    [provider]  enoxaparin (LOVENOX) 100 MG/ML injection Inject 0.9 mLs (90 mg total) into the skin every 12 (twelve) hours for 2 days. Hold injection 12 hours before port placement 06/30/23 07/02/23  Georga Kaufmann T, PA-C  ferrous fumarate (HEMOCYTE - 106 MG FE) 325 (106 FE) MG TABS Take 1  tablet by mouth daily.    [provider]  fluticasone (FLONASE) 50 MCG/ACT nasal spray Place 1 spray into both nostrils 2 (two) times daily. 04/07/23   [provider]  folic acid (FOLVITE) 800 MCG tablet Take 800 mcg by mouth daily.     [provider]  icosapent Ethyl (VASCEPA) 1 g capsule Take 2 capsules (2 g total) by mouth 2 (two) times daily. 04/26/23   Sharlene Dory, PA-C  lidocaine-prilocaine (EMLA) cream Apply 1 Application topically as needed. 07/07/23   Jaci Standard, MD  ondansetron (ZOFRAN) 8 MG tablet Take 1 tablet (8 mg total) by mouth every 8 (eight) hours as needed. 07/07/23   Jaci Standard, MD  predniSONE (DELTASONE) 20 MG tablet Take 3 tablets (60 mg total) by mouth daily with breakfast. 07/07/23   Jaci Standard, MD  prochlorperazine (COMPAZINE) 10 MG tablet Take 1 tablet (10 mg total) by mouth every 6 (six) hours as needed for nausea or vomiting. 07/07/23   Ulysees Barns IV, MD  ramipril (ALTACE) 2.5 MG capsule TAKE 1 CAPSULE BY MOUTH EVERY DAY 06/21/23   Quintella Reichert, MD  rosuvastatin (CRESTOR) 10 MG tablet TAKE 1 TABLET BY MOUTH EVERY DAY 05/09/23   Quintella Reichert, MD  vitamin C (ASCORBIC ACID) 500 MG tablet Take 1,000  mg by mouth daily.     [provider]  vitamin E 400 UNIT capsule Take 400 Units by mouth daily.    [provider]      Allergies    Niacin and related    Review of Systems   Review of Systems  Gastrointestinal:  Positive for abdominal pain.    Physical Exam Updated Vital Signs BP 109/67 (BP Location: Right Arm)   Pulse 96   Temp 98.2 F (36.8 C) (Oral)   Resp 16   Ht 5\' 11"  (1.803 m)   Wt 84 kg   SpO2 96%   BMI 25.83 kg/m  Physical Exam  ED Results / Procedures / Treatments   Labs (all labs ordered are listed, but only abnormal results are displayed) Labs Reviewed  CBC WITH DIFFERENTIAL/PLATELET - Abnormal; Notable for the following components:      Result Value   WBC 14.9 (*)     RBC 3.93 (*)    Hemoglobin 12.3 (*)    HCT 37.4 (*)    Platelets 127 (*)    nRBC 0.4 (*)    Neutro Abs 11.4 (*)    Lymphs Abs 0.3 (*)    Abs Immature Granulocytes 1.78 (*)    All other components within normal limits  URINALYSIS, ROUTINE W REFLEX MICROSCOPIC - Abnormal; Notable for the following components:   Ketones, ur 15 (*)    All other components within normal limits  COMPREHENSIVE METABOLIC PANEL - Abnormal; Notable for the following components:   CO2 21 (*)    Glucose, Bld 118 (*)    Calcium 8.2 (*)    All other components within normal limits  LIPASE, BLOOD    EKG None  Radiology No results found.  Procedures Procedures    Medications Ordered in ED Medications  sodium chloride 0.9 % bolus 1,000 mL ( Intravenous Stopped 08/17/23 2247)  morphine (PF) 4 MG/ML injection 4 mg (4 mg Intravenous Given 08/17/23 2137)  ondansetron (ZOFRAN) injection 4 mg (4 mg Intravenous Given 08/17/23 2136)  iohexol (OMNIPAQUE) 350 MG/ML injection 100 mL (100 mLs Intravenous Contrast Given 08/17/23 2301)    ED Course/ Medical Decision Making/ A&P Clinical Course as of 08/17/23 2329  Wed Aug 17, 2023  2254 CT Angio Abd/Pel W and/or Wo Contrast [PA]    Clinical Course User Index [PA] Kathleen Lime, MD                                 Medical Decision Making This patient is a 77 y.o. male who presents to the ED for concern of abdominal pain, this involves an extensive number of treatment options, and is a complaint that carries with it a high risk of complications and morbidity. The emergent differential diagnosis prior to evaluation includes, but is not limited to, appendicitis, colitis and mesenteric ischemia . This is not an exhaustive differential.   Past Medical History / Co-morbidities / Social History: CAD B-Cell lymphoma HLD  Physical Exam: Physical exam performed. The pertinent findings include: Mild abdominal tenderness around the right lower quadrant  Lab Tests: I  ordered, and personally interpreted labs.  The pertinent results include: WBC of 14.9    Imaging Studies: I ordered imaging studies including Abd XR and CT Abd/Pelvis. I independently visualized and interpreted EKG which showed no active cardiopulmonary disease at this time. I agree with the radiologist interpretation.   Medications: I ordered medication including Morphine  for Pain and Zofran for nausea. Reevaluation of the patient after these medicines showed that the patient improved. I have reviewed the patients home medicines and have made adjustments as needed.   Disposition: After consideration of the diagnostic results and the patients response to treatment,if CT Abdomen come back negative and patients pain is controlled, he can be discharged home.CT Abd/Pelvis pending. I signed out to Dr Judd Lien   .   I discussed this case with my attending physician Dr. Silverio Lay  who cosigned this note including patient's presenting symptoms, physical exam, and planned diagnostics and interventions. Attending physician stated agreement with plan or made changes to plan which were implemented.      Amount and/or Complexity of Data Reviewed Labs: ordered.    Details: CBC.CMP Radiology: ordered. Decision-making details documented in ED Course.    Details: CT Angio Abdomen  Risk Prescription drug management.           Final Clinical Impression(s) / ED Diagnoses Final diagnoses:  Right lower quadrant abdominal pain    Rx / DC Orders ED Discharge Orders     None         Kathleen Lime, MD 08/17/23 1610    Geoffery Lyons, MD 08/18/23 [phone]

## 2023-08-18 ENCOUNTER — Telehealth: Payer: Self-pay

## 2023-08-18 ENCOUNTER — Telehealth (HOSPITAL_BASED_OUTPATIENT_CLINIC_OR_DEPARTMENT_OTHER): Payer: Self-pay | Admitting: Emergency Medicine

## 2023-08-18 ENCOUNTER — Other Ambulatory Visit (HOSPITAL_BASED_OUTPATIENT_CLINIC_OR_DEPARTMENT_OTHER): Payer: Self-pay

## 2023-08-18 MED ORDER — CIPROFLOXACIN HCL 500 MG PO TABS
500.0000 mg | ORAL_TABLET | Freq: Once | ORAL | Status: AC
  Administered 2023-08-18: 500 mg via ORAL
  Filled 2023-08-18: qty 1

## 2023-08-18 MED ORDER — CIPROFLOXACIN HCL 500 MG PO TABS
500.0000 mg | ORAL_TABLET | Freq: Two times a day (BID) | ORAL | 0 refills | Status: DC
  Filled 2023-08-18: qty 14, 7d supply, fill #0

## 2023-08-18 MED ORDER — METRONIDAZOLE 500 MG PO TABS: 500.0000 mg | ORAL_TABLET | Freq: Three times a day (TID) | ORAL | 0 refills | Status: DC

## 2023-08-18 MED ORDER — HYDROCODONE-ACETAMINOPHEN 5-325 MG PO TABS: 1.0000 | ORAL_TABLET | Freq: Four times a day (QID) | ORAL | 0 refills | Status: DC | PRN

## 2023-08-18 MED ORDER — CIPROFLOXACIN HCL 500 MG PO TABS: 500.0000 mg | ORAL_TABLET | Freq: Two times a day (BID) | ORAL | 0 refills | Status: DC

## 2023-08-18 MED ORDER — HYDROCODONE-ACETAMINOPHEN 5-325 MG PO TABS
1.0000 | ORAL_TABLET | Freq: Four times a day (QID) | ORAL | 0 refills | Status: DC | PRN
  Filled 2023-08-18: qty 15, 4d supply, fill #0

## 2023-08-18 MED ORDER — METRONIDAZOLE 500 MG PO TABS
500.0000 mg | ORAL_TABLET | Freq: Once | ORAL | Status: AC
  Administered 2023-08-18: 500 mg via ORAL
  Filled 2023-08-18: qty 1

## 2023-08-18 MED ORDER — METRONIDAZOLE 500 MG PO TABS
500.0000 mg | ORAL_TABLET | Freq: Three times a day (TID) | ORAL | 0 refills | Status: DC
  Filled 2023-08-18: qty 21, 7d supply, fill #0

## 2023-08-18 NOTE — Telephone Encounter (Cosign Needed)
4:53 PM patient's pharmacy does not have hydrocodone-acetaminophen available.  RN called and canceled prescription.  Will call in prescription to our pharmacy here.  Reviewed patient's ED visit note from last night briefly.  He has a history of lymphoma and was diagnosed with enteritis.  Previous provider wrote prescription for # 15 tablets, 1 tablet by mouth every 6 hours.

## 2023-08-18 NOTE — Telephone Encounter (Cosign Needed)
Called by pharmacy in regards to antibiotic rx. Pt requesting having these sent here as well.

## 2023-08-18 NOTE — ED Notes (Signed)
[facility] called about Norco rx, states back ordered.  Provider made aware, [facility] cancelled current order and provider sent to out pharmacy so that pt can get it filled.

## 2023-08-18 NOTE — Discharge Instructions (Addendum)
Begin taking Cipro and Flagyl as prescribed.  Begin taking hydrocodone as prescribed as needed for pain.  Follow-up with your oncologist in the next 2 to 3 days, and return to the ER if you develop worsening pain, high fevers, bloody stools, or for other new and concerning symptoms.

## 2023-08-18 NOTE — Telephone Encounter (Signed)
Pt called regarding paperwork for a tooth extraction. Pt explains recent d/c from ER regarding lower abd pain and was given abx (CIPRO) upon d/c from ER. Pt also explains he was receiving abx from the dentist appx a week ago as well but the infection has cleared up from the Amoxicillin. Pt confirmed he is still taking Eliquis.   Dr. Leonides Schanz made aware of the above information and has suggested that the pt wait until early September/Early October if not urgent. Pt was agreeable to plan and was provided the fax number to our office in order to give to his dentist to send over paperwork. Pt instructed to please call back if any other questions arise.

## 2023-08-23 LAB — CULTURE, BLOOD (ROUTINE X 2)
Culture: NO GROWTH
Culture: NO GROWTH
Special Requests: ADEQUATE
Special Requests: ADEQUATE

## 2023-08-26 ENCOUNTER — Other Ambulatory Visit: Payer: Self-pay | Admitting: Hematology and Oncology

## 2023-08-26 ENCOUNTER — Telehealth: Payer: Self-pay

## 2023-08-26 ENCOUNTER — Inpatient Hospital Stay: Payer: Medicare Other | Attending: Physician Assistant

## 2023-08-26 ENCOUNTER — Inpatient Hospital Stay (HOSPITAL_BASED_OUTPATIENT_CLINIC_OR_DEPARTMENT_OTHER): Payer: Medicare Other | Admitting: Physician Assistant

## 2023-08-26 ENCOUNTER — Inpatient Hospital Stay: Payer: Medicare Other

## 2023-08-26 VITALS — BP 97/70 | HR 86 | Resp 18

## 2023-08-26 DIAGNOSIS — C8338 Diffuse large B-cell lymphoma, lymph nodes of multiple sites: Secondary | ICD-10-CM | POA: Insufficient documentation

## 2023-08-26 DIAGNOSIS — Z79630 Long term (current) use of alkylating agent: Secondary | ICD-10-CM | POA: Insufficient documentation

## 2023-08-26 DIAGNOSIS — Z8546 Personal history of malignant neoplasm of prostate: Secondary | ICD-10-CM | POA: Insufficient documentation

## 2023-08-26 DIAGNOSIS — Z79633 Long term (current) use of mitotic inhibitor: Secondary | ICD-10-CM | POA: Diagnosis not present

## 2023-08-26 DIAGNOSIS — Z79620 Long term (current) use of immunosuppressive biologic: Secondary | ICD-10-CM | POA: Diagnosis not present

## 2023-08-26 DIAGNOSIS — R1031 Right lower quadrant pain: Secondary | ICD-10-CM | POA: Insufficient documentation

## 2023-08-26 DIAGNOSIS — I700 Atherosclerosis of aorta: Secondary | ICD-10-CM | POA: Diagnosis not present

## 2023-08-26 DIAGNOSIS — Z79899 Other long term (current) drug therapy: Secondary | ICD-10-CM | POA: Diagnosis not present

## 2023-08-26 DIAGNOSIS — Z79632 Long term (current) use of antitumor antibiotic: Secondary | ICD-10-CM | POA: Diagnosis not present

## 2023-08-26 DIAGNOSIS — Z5112 Encounter for antineoplastic immunotherapy: Secondary | ICD-10-CM | POA: Diagnosis present

## 2023-08-26 DIAGNOSIS — Z803 Family history of malignant neoplasm of breast: Secondary | ICD-10-CM | POA: Diagnosis not present

## 2023-08-26 DIAGNOSIS — I252 Old myocardial infarction: Secondary | ICD-10-CM | POA: Diagnosis not present

## 2023-08-26 DIAGNOSIS — Z87891 Personal history of nicotine dependence: Secondary | ICD-10-CM | POA: Insufficient documentation

## 2023-08-26 DIAGNOSIS — N281 Cyst of kidney, acquired: Secondary | ICD-10-CM | POA: Insufficient documentation

## 2023-08-26 DIAGNOSIS — I2510 Atherosclerotic heart disease of native coronary artery without angina pectoris: Secondary | ICD-10-CM | POA: Insufficient documentation

## 2023-08-26 DIAGNOSIS — Z8249 Family history of ischemic heart disease and other diseases of the circulatory system: Secondary | ICD-10-CM | POA: Diagnosis not present

## 2023-08-26 DIAGNOSIS — Z5111 Encounter for antineoplastic chemotherapy: Secondary | ICD-10-CM | POA: Diagnosis present

## 2023-08-26 DIAGNOSIS — R21 Rash and other nonspecific skin eruption: Secondary | ICD-10-CM | POA: Insufficient documentation

## 2023-08-26 DIAGNOSIS — E785 Hyperlipidemia, unspecified: Secondary | ICD-10-CM | POA: Diagnosis not present

## 2023-08-26 DIAGNOSIS — Z86718 Personal history of other venous thrombosis and embolism: Secondary | ICD-10-CM | POA: Insufficient documentation

## 2023-08-26 DIAGNOSIS — Z7901 Long term (current) use of anticoagulants: Secondary | ICD-10-CM | POA: Diagnosis not present

## 2023-08-26 DIAGNOSIS — Z95828 Presence of other vascular implants and grafts: Secondary | ICD-10-CM

## 2023-08-26 DIAGNOSIS — Z5189 Encounter for other specified aftercare: Secondary | ICD-10-CM | POA: Diagnosis not present

## 2023-08-26 DIAGNOSIS — K8020 Calculus of gallbladder without cholecystitis without obstruction: Secondary | ICD-10-CM | POA: Insufficient documentation

## 2023-08-26 LAB — CBC WITH DIFFERENTIAL (CANCER CENTER ONLY)
Abs Immature Granulocytes: 0.09 10*3/uL — ABNORMAL HIGH (ref 0.00–0.07)
Basophils Absolute: 0 10*3/uL (ref 0.0–0.1)
Basophils Relative: 1 %
Eosinophils Absolute: 0 10*3/uL (ref 0.0–0.5)
Eosinophils Relative: 0 %
HCT: 34.7 % — ABNORMAL LOW (ref 39.0–52.0)
Hemoglobin: 11.4 g/dL — ABNORMAL LOW (ref 13.0–17.0)
Immature Granulocytes: 2 %
Lymphocytes Relative: 3 %
Lymphs Abs: 0.2 10*3/uL — ABNORMAL LOW (ref 0.7–4.0)
MCH: 31.3 pg (ref 26.0–34.0)
MCHC: 32.9 g/dL (ref 30.0–36.0)
MCV: 95.3 fL (ref 80.0–100.0)
Monocytes Absolute: 0.2 10*3/uL (ref 0.1–1.0)
Monocytes Relative: 4 %
Neutro Abs: 5.4 10*3/uL (ref 1.7–7.7)
Neutrophils Relative %: 90 %
Platelet Count: 350 10*3/uL (ref 150–400)
RBC: 3.64 MIL/uL — ABNORMAL LOW (ref 4.22–5.81)
RDW: 15.1 % (ref 11.5–15.5)
WBC Count: 5.9 10*3/uL (ref 4.0–10.5)
nRBC: 0 % (ref 0.0–0.2)

## 2023-08-26 LAB — CMP (CANCER CENTER ONLY)
ALT: 18 U/L (ref 0–44)
AST: 20 U/L (ref 15–41)
Albumin: 3.9 g/dL (ref 3.5–5.0)
Alkaline Phosphatase: 57 U/L (ref 38–126)
Anion gap: 8 (ref 5–15)
BUN: 17 mg/dL (ref 8–23)
CO2: 24 mmol/L (ref 22–32)
Calcium: 9 mg/dL (ref 8.9–10.3)
Chloride: 106 mmol/L (ref 98–111)
Creatinine: 0.8 mg/dL (ref 0.61–1.24)
GFR, Estimated: >60 mL/min (ref >60)
Glucose, Bld: 153 mg/dL — ABNORMAL HIGH (ref 70–99)
Potassium: 3.9 mmol/L (ref 3.5–5.1)
Sodium: 138 mmol/L (ref 135–145)
Total Bilirubin: 0.3 mg/dL (ref 0.3–1.2)
Total Protein: 7 g/dL (ref 6.5–8.1)

## 2023-08-26 MED ORDER — PALONOSETRON HCL INJECTION 0.25 MG/5ML
0.2500 mg | Freq: Once | INTRAVENOUS | Status: AC
  Administered 2023-08-26: 0.25 mg via INTRAVENOUS
  Filled 2023-08-26: qty 5

## 2023-08-26 MED ORDER — VINCRISTINE SULFATE CHEMO INJECTION 1 MG/ML
2.0000 mg | Freq: Once | INTRAVENOUS | Status: AC
  Administered 2023-08-26: 2 mg via INTRAVENOUS
  Filled 2023-08-26: qty 2

## 2023-08-26 MED ORDER — SODIUM CHLORIDE 0.9 % IV SOLN
750.0000 mg/m2 | Freq: Once | INTRAVENOUS | Status: AC
  Administered 2023-08-26: 1500 mg via INTRAVENOUS
  Filled 2023-08-26: qty 75

## 2023-08-26 MED ORDER — ACETAMINOPHEN 325 MG PO TABS
650.0000 mg | ORAL_TABLET | Freq: Once | ORAL | Status: AC
  Administered 2023-08-26: 650 mg via ORAL
  Filled 2023-08-26: qty 2

## 2023-08-26 MED ORDER — SODIUM CHLORIDE 0.9 % IV SOLN
375.0000 mg/m2 | Freq: Once | INTRAVENOUS | Status: AC
  Administered 2023-08-26: 800 mg via INTRAVENOUS
  Filled 2023-08-26: qty 50

## 2023-08-26 MED ORDER — SODIUM CHLORIDE 0.9% FLUSH
10.0000 mL | Freq: Once | INTRAVENOUS | Status: AC
  Administered 2023-08-26: 10 mL

## 2023-08-26 MED ORDER — DOXORUBICIN HCL CHEMO IV INJECTION 2 MG/ML
50.0000 mg/m2 | Freq: Once | INTRAVENOUS | Status: AC
  Administered 2023-08-26: 106 mg via INTRAVENOUS
  Filled 2023-08-26: qty 53

## 2023-08-26 MED ORDER — HEPARIN SOD (PORK) LOCK FLUSH 100 UNIT/ML IV SOLN
500.0000 [IU] | Freq: Once | INTRAVENOUS | Status: AC | PRN
  Administered 2023-08-26: 500 [IU]

## 2023-08-26 MED ORDER — DIPHENHYDRAMINE HCL 25 MG PO CAPS
50.0000 mg | ORAL_CAPSULE | Freq: Once | ORAL | Status: AC
  Administered 2023-08-26: 50 mg via ORAL
  Filled 2023-08-26: qty 2

## 2023-08-26 MED ORDER — SODIUM CHLORIDE 0.9% FLUSH
10.0000 mL | INTRAVENOUS | Status: DC | PRN
  Administered 2023-08-26: 10 mL

## 2023-08-26 MED ORDER — SODIUM CHLORIDE 0.9 % IV SOLN
10.0000 mg | Freq: Once | INTRAVENOUS | Status: AC
  Administered 2023-08-26: 10 mg via INTRAVENOUS
  Filled 2023-08-26: qty 10

## 2023-08-26 MED ORDER — SODIUM CHLORIDE 0.9 % IV SOLN: Freq: Once | INTRAVENOUS | Status: AC

## 2023-08-26 MED ORDER — HYDROCODONE-ACETAMINOPHEN 5-325 MG PO TABS: 1.0000 | ORAL_TABLET | Freq: Four times a day (QID) | ORAL | 0 refills | Status: DC | PRN

## 2023-08-26 MED ORDER — SODIUM CHLORIDE 0.9 % IV SOLN
150.0000 mg | Freq: Once | INTRAVENOUS | Status: AC
  Administered 2023-08-26: 150 mg via INTRAVENOUS
  Filled 2023-08-26: qty 150

## 2023-08-26 NOTE — Patient Instructions (Signed)
 [facility]  Discharge Instructions: Thank you for choosing [facility] to provide your oncology and hematology care.   If you have a lab appointment with the Cancer Center, please go directly to the Cancer Center and check in at the registration area.   Wear comfortable clothing and clothing appropriate for easy access to any Portacath or PICC line.   We strive to give you quality time with your provider. You may need to reschedule your appointment if you arrive late (15 or more minutes).  Arriving late affects you and other patients whose appointments are after yours.  Also, if you miss three or more appointments without notifying the office, you may be dismissed from the clinic at the provider's discretion.      For prescription refill requests, have your pharmacy contact our office and allow 72 hours for refills to be completed.    Today you received the following chemotherapy and/or immunotherapy agents: Doxorubicin, Vincristine, Cyclophosphamide, Rituximab      To help prevent nausea and vomiting after your treatment, we encourage you to take your nausea medication as directed.  BELOW ARE SYMPTOMS THAT SHOULD BE REPORTED IMMEDIATELY: *FEVER GREATER THAN 100.4 F (38 C) OR HIGHER *CHILLS OR SWEATING *NAUSEA AND VOMITING THAT IS NOT CONTROLLED WITH YOUR NAUSEA MEDICATION *UNUSUAL SHORTNESS OF BREATH *UNUSUAL BRUISING OR BLEEDING *URINARY PROBLEMS (pain or burning when urinating, or frequent urination) *BOWEL PROBLEMS (unusual diarrhea, constipation, pain near the anus) TENDERNESS IN MOUTH AND THROAT WITH OR WITHOUT PRESENCE OF ULCERS (sore throat, sores in mouth, or a toothache) UNUSUAL RASH, SWELLING OR PAIN  UNUSUAL VAGINAL DISCHARGE OR ITCHING   Items with * indicate a potential emergency and should be followed up as soon as possible or go to the Emergency Department if any problems should occur.  Please show the CHEMOTHERAPY  ALERT CARD or IMMUNOTHERAPY ALERT CARD at check-in to the Emergency Department and triage nurse.  Should you have questions after your visit or need to cancel or reschedule your appointment, please contact [facility]  Dept: [phone]  and follow the prompts.  Office hours are 8:00 a.m. to 4:30 p.m. Monday - Friday. Please note that voicemails left after 4:00 p.m. may not be returned until the following business day.  We are closed weekends and major holidays. You have access to a nurse at all times for urgent questions. Please call the main number to the clinic Dept: [phone] and follow the prompts.   For any non-urgent questions, you may also contact your provider using MyChart. We now offer e-Visits for anyone 55 and older to request care online for non-urgent symptoms. For details visit [email or web address].   Also download the MyChart app! Go to the app store, search "MyChart", open the app, select [facility], and log in with your MyChart username and password.  Doxorubicin Injection What is this medication? DOXORUBICIN (dox oh ROO bi sin) treats some types of cancer. It works by slowing down the growth of cancer cells. This medicine may be used for other purposes; ask your health care provider or pharmacist if you have questions. COMMON BRAND NAME(S): Adriamycin, Adriamycin PFS, Adriamycin RDF, Rubex What should I tell my care team before I take this medication? They need to know if you have any of these conditions: Heart disease History of low blood cell levels caused by a medication Liver disease Recent or ongoing radiation An unusual or allergic reaction to doxorubicin,  other medications, foods, dyes, or preservatives If you or your partner are pregnant or trying to get pregnant Breast-feeding How should I use this medication? This medication is injected into a vein. It is given by your care team in a hospital or clinic setting. Talk  to your care team about the use of this medication in children. Special care may be needed. Overdosage: If you think you have taken too much of this medicine contact a poison control center or emergency room at once. NOTE: This medicine is only for you. Do not share this medicine with others. What if I miss a dose? Keep appointments for follow-up doses. It is important not to miss your dose. Call your care team if you are unable to keep an appointment. What may interact with this medication? 6-mercaptopurine Paclitaxel Phenytoin St. John's wort Trastuzumab Verapamil This list may not describe all possible interactions. Give your health care provider a list of all the medicines, herbs, non-prescription drugs, or dietary supplements you use. Also tell them if you smoke, drink alcohol, or use illegal drugs. Some items may interact with your medicine. What should I watch for while using this medication? Your condition will be monitored carefully while you are receiving this medication. You may need blood work while taking this medication. This medication may make you feel generally unwell. This is not uncommon as chemotherapy can affect healthy cells as well as cancer cells. Report any side effects. Continue your course of treatment even though you feel ill unless your care team tells you to stop. There is a maximum amount of this medication you should receive throughout your life. The amount depends on the medical condition being treated and your overall health. Your care team will watch how much of this medication you receive. Tell your care team if you have taken this medication before. Your urine may turn red for a few days after your dose. This is not blood. If your urine is dark or brown, call your care team. In some cases, you may be given additional medications to help with side effects. Follow all directions for their use. This medication may increase your risk of getting an infection. Call  your care team for advice if you get a fever, chills, sore throat, or other symptoms of a cold or flu. Do not treat yourself. Try to avoid being around people who are sick. This medication may increase your risk to bruise or bleed. Call your care team if you notice any unusual bleeding. Talk to your care team about your risk of cancer. You may be more at risk for certain types of cancers if you take this medication. Talk to your care team if you or your partner may be pregnant. Serious birth defects can occur if you take this medication during pregnancy and for 6 months after the last dose. Contraception is recommended while taking this medication and for 6 months after the last dose. Your care team can help you find the option that works for you. If your partner can get pregnant, use a condom while taking this medication and for 6 months after the last dose. Do not breastfeed while taking this medication. This medication may cause infertility. Talk to your care team if you are concerned about your fertility. What side effects may I notice from receiving this medication? Side effects that you should report to your care team as soon as possible: Allergic reactions--skin rash, itching, hives, swelling of the face, lips, tongue, or throat Heart failure--shortness  of breath, swelling of the ankles, feet, or hands, sudden weight gain, unusual weakness or fatigue Heart rhythm changes--fast or irregular heartbeat, dizziness, feeling faint or lightheaded, chest pain, trouble breathing Infection--fever, chills, cough, sore throat, wounds that don't heal, pain or trouble when passing urine, general feeling of discomfort or being unwell Low red blood cell level--unusual weakness or fatigue, dizziness, headache, trouble breathing Painful swelling, warmth, or redness of the skin, blisters or sores at the infusion site Unusual bruising or bleeding Side effects that usually do not require medical attention (report  to your care team if they continue or are bothersome): Diarrhea Hair loss Nausea Pain, redness, or swelling with sores inside the mouth or throat Red urine This list may not describe all possible side effects. Call your doctor for medical advice about side effects. You may report side effects to FDA at 1-800-FDA-1088. Where should I keep my medication? This medication is given in a hospital or clinic. It will not be stored at home. NOTE: This sheet is a summary. It may not cover all possible information. If you have questions about this medicine, talk to your doctor, pharmacist, or health care provider.  2024 Elsevier/Gold Standard (2023-03-10 00:00:00)  Vincristine Injection What is this medication? VINCRISTINE (vin KRIS teen) treats some types of cancer. It works by slowing down the growth of cancer cells. This medicine may be used for other purposes; ask your health care provider or pharmacist if you have questions. COMMON BRAND NAME(S): Oncovin, Vincasar PFS What should I tell my care team before I take this medication? They need to know if you have any of these conditions: Infection Kidney disease Liver disease Low white blood cell levels Lung disease Nervous system disease, such as Charcot-Marie-Tooth (CMT) Recent or ongoing radiation therapy An unusual or allergic reaction to vincristine, other chemotherapy agents, other medications, foods, dyes, or preservatives Pregnant or trying to get pregnant Breast-feeding How should I use this medication? This medication is infused into a vein. It is given by your care team in a hospital or clinic setting. Talk to your care team about the use of this medication in children. While it may be given to children for selected conditions, precautions do apply. Overdosage: If you think you have taken too much of this medicine contact a poison control center or emergency room at once. NOTE: This medicine is only for you. Do not share this  medicine with others. What if I miss a dose? Keep appointments for follow-up doses. It is important not to miss your dose. Call your care team if you are unable to keep an appointment. What may interact with this medication? Do not take this medication with any of the following: Live virus vaccines This medication may also interact with the following: Medications for fungal infections, such as itraconazole or fluconazole Phenytoin Supplements, such as St. John's wort This list may not describe all possible interactions. Give your health care provider a list of all the medicines, herbs, non-prescription drugs, or dietary supplements you use. Also tell them if you smoke, drink alcohol, or use illegal drugs. Some items may interact with your medicine. What should I watch for while using this medication? Your condition will be monitored carefully while you are receiving this medication. This medication may make you feel generally unwell. This is not uncommon as chemotherapy can affect healthy cells as well as cancer cells. Report any side effects. Continue your course of treatment even though you feel ill unless your care team tells you  to stop. You may need blood work while taking this medication. This medication may increase your risk to bruise or bleed. Call your care team if you notice any unusual bleeding. This medication may increase your risk of getting an infection. Call your care team for advice if you get a fever, chills, sore throat, or other symptoms of a cold or flu. Do not treat yourself. Try to avoid being around people who are sick. This medication will cause constipation. If you do not have a bowel movement for 3 days, call your care team. Call your care team if you are around anyone with measles, chickenpox, or if you develop sores or blisters that do not heal properly. Be careful brushing or flossing your teeth or using a toothpick because you may get an infection or bleed more  easily. If you have any dental work done, tell your dentist you are receiving this medication Talk to your care team if you or your partner wish to become pregnant or think either of you might be pregnant. This medication can cause serious birth defects. This medication may cause infertility. Talk to your care team if you are concerned about your fertility. Talk to your care team before breastfeeding. Changes to your treatment plan may be needed. What side effects may I notice from receiving this medication? Side effects that you should report to your care team as soon as possible: Allergic reactions--skin rash, itching, hives, swelling of the face, lips, tongue, or throat High uric acid level--severe pain, redness, warmth, or swelling in joints, pain or trouble passing urine, pain in the lower back or sides Infection--fever, chills, cough, sore throat, wounds that don't heal, pain or trouble when passing urine, general feeling of discomfort or being unwell Pain, tingling, or numbness in the hands or feet, muscle weakness, change in vision, confusion or trouble speaking, loss of balance or coordination, trouble walking, seizures Painful swelling, warmth, or redness of the skin, blisters or sores at the infusion site Shortness of breath or trouble breathing Side effects that usually do not require medical attention (report to your care team if they continue or are bothersome): Constipation Diarrhea Hair loss Loss of appetite Nausea Stomach cramping Vomiting This list may not describe all possible side effects. Call your doctor for medical advice about side effects. You may report side effects to FDA at 1-800-FDA-1088. Where should I keep my medication? This medication is given in a hospital or clinic. It will not be stored at home. NOTE: This sheet is a summary. It may not cover all possible information. If you have questions about this medicine, talk to your doctor, pharmacist, or health care  provider.  2024 Elsevier/Gold Standard (2022-03-02 00:00:00)  Cyclophosphamide Injection What is this medication? CYCLOPHOSPHAMIDE (sye kloe FOSS fa mide) treats some types of cancer. It works by slowing down the growth of cancer cells. This medicine may be used for other purposes; ask your health care provider or pharmacist if you have questions. COMMON BRAND NAME(S): Cyclophosphamide, Cytoxan, Neosar What should I tell my care team before I take this medication? They need to know if you have any of these conditions: Heart disease Irregular heartbeat or rhythm Infection Kidney problems Liver disease Low blood cell levels (white cells, platelets, or red blood cells) Lung disease Previous radiation Trouble passing urine An unusual or allergic reaction to cyclophosphamide, other medications, foods, dyes, or preservatives Pregnant or trying to get pregnant Breast-feeding How should I use this medication? This medication is injected into a  vein. It is given by your care team in a hospital or clinic setting. Talk to your care team about the use of this medication in children. Special care may be needed. Overdosage: If you think you have taken too much of this medicine contact a poison control center or emergency room at once. NOTE: This medicine is only for you. Do not share this medicine with others. What if I miss a dose? Keep appointments for follow-up doses. It is important not to miss your dose. Call your care team if you are unable to keep an appointment. What may interact with this medication? Amphotericin B Amiodarone Azathioprine Certain antivirals for HIV or hepatitis Certain medications for blood pressure, such as enalapril, lisinopril, quinapril Cyclosporine Diuretics Etanercept Indomethacin Medications that relax muscles Metronidazole Natalizumab Tamoxifen Warfarin This list may not describe all possible interactions. Give your health care provider a list of all  the medicines, herbs, non-prescription drugs, or dietary supplements you use. Also tell them if you smoke, drink alcohol, or use illegal drugs. Some items may interact with your medicine. What should I watch for while using this medication? This medication may make you feel generally unwell. This is not uncommon as chemotherapy can affect healthy cells as well as cancer cells. Report any side effects. Continue your course of treatment even though you feel ill unless your care team tells you to stop. You may need blood work while you are taking this medication. This medication may increase your risk of getting an infection. Call your care team for advice if you get a fever, chills, sore throat, or other symptoms of a cold or flu. Do not treat yourself. Try to avoid being around people who are sick. Avoid taking medications that contain aspirin, acetaminophen, ibuprofen, naproxen, or ketoprofen unless instructed by your care team. These medications may hide a fever. Be careful brushing or flossing your teeth or using a toothpick because you may get an infection or bleed more easily. If you have any dental work done, tell your dentist you are receiving this medication. Drink water or other fluids as directed. Urinate often, even at night. Some products may contain alcohol. Ask your care team if this medication contains alcohol. Be sure to tell all care teams you are taking this medicine. Certain medicines, like metronidazole and disulfiram, can cause an unpleasant reaction when taken with alcohol. The reaction includes flushing, headache, nausea, vomiting, sweating, and increased thirst. The reaction can last from 30 minutes to several hours. Talk to your care team if you wish to become pregnant or think you might be pregnant. This medication can cause serious birth defects if taken during pregnancy and for 1 year after the last dose. A negative pregnancy test is required before starting this medication. A  reliable form of contraception is recommended while taking this medication and for 1 year after the last dose. Talk to your care team about reliable forms of contraception. Do not father a child while taking this medication and for 4 months after the last dose. Use a condom during this time period. Do not breast-feed while taking this medication or for 1 week after the last dose. This medication may cause infertility. Talk to your care team if you are concerned about your fertility. Talk to your care team about your risk of cancer. You may be more at risk for certain types of cancer if you take this medication. What side effects may I notice from receiving this medication? Side effects that you should  report to your care team as soon as possible: Allergic reactions--skin rash, itching, hives, swelling of the face, lips, tongue, or throat Dry cough, shortness of breath or trouble breathing Heart failure--shortness of breath, swelling of the ankles, feet, or hands, sudden weight gain, unusual weakness or fatigue Heart muscle inflammation--unusual weakness or fatigue, shortness of breath, chest pain, fast or irregular heartbeat, dizziness, swelling of the ankles, feet, or hands Heart rhythm changes--fast or irregular heartbeat, dizziness, feeling faint or lightheaded, chest pain, trouble breathing Infection--fever, chills, cough, sore throat, wounds that don't heal, pain or trouble when passing urine, general feeling of discomfort or being unwell Kidney injury--decrease in the amount of urine, swelling of the ankles, hands, or feet Liver injury--right upper belly pain, loss of appetite, nausea, light-colored stool, dark yellow or brown urine, yellowing skin or eyes, unusual weakness or fatigue Low red blood cell level--unusual weakness or fatigue, dizziness, headache, trouble breathing Low sodium level--muscle weakness, fatigue, dizziness, headache, confusion Red or dark brown urine Unusual bruising or  bleeding Side effects that usually do not require medical attention (report to your care team if they continue or are bothersome): Hair loss Irregular menstrual cycles or spotting Loss of appetite Nausea Pain, redness, or swelling with sores inside the mouth or throat Vomiting This list may not describe all possible side effects. Call your doctor for medical advice about side effects. You may report side effects to FDA at 1-800-FDA-1088. Where should I keep my medication? This medication is given in a hospital or clinic. It will not be stored at home. NOTE: This sheet is a summary. It may not cover all possible information. If you have questions about this medicine, talk to your doctor, pharmacist, or health care provider.  2024 Elsevier/Gold Standard (2022-04-23 00:00:00)  Rituximab Injection What is this medication? RITUXIMAB (ri TUX i mab) treats leukemia and lymphoma. It works by blocking a protein that causes cancer cells to grow and multiply. This helps to slow or stop the spread of cancer cells. It may also be used to treat autoimmune conditions, such as arthritis. It works by slowing down an overactive immune system. It is a monoclonal antibody. This medicine may be used for other purposes; ask your health care provider or pharmacist if you have questions. COMMON BRAND NAME(S): RIABNI, Rituxan, RUXIENCE, truxima What should I tell my care team before I take this medication? They need to know if you have any of these conditions: Chest pain Heart disease Immune system problems Infection, such as chickenpox, cold sores, hepatitis B, herpes Irregular heartbeat or rhythm Kidney disease Low blood counts, such as low white cells, platelets, red cells Lung disease Recent or upcoming vaccine An unusual or allergic reaction to rituximab, other medications, foods, dyes, or preservatives Pregnant or trying to get pregnant Breast-feeding How should I use this medication? This medication  is injected into a vein. It is given by a care team in a hospital or clinic setting. A special MedGuide will be given to you before each treatment. Be sure to read this information carefully each time. Talk to your care team about the use of this medication in children. While this medication may be prescribed for children as young as 6 months for selected conditions, precautions do apply. Overdosage: If you think you have taken too much of this medicine contact a poison control center or emergency room at once. NOTE: This medicine is only for you. Do not share this medicine with others. What if I miss a dose? Keep  appointments for follow-up doses. It is important not to miss your dose. Call your care team if you are unable to keep an appointment. What may interact with this medication? Do not take this medication with any of the following: Live vaccines This medication may also interact with the following: Cisplatin This list may not describe all possible interactions. Give your health care provider a list of all the medicines, herbs, non-prescription drugs, or dietary supplements you use. Also tell them if you smoke, drink alcohol, or use illegal drugs. Some items may interact with your medicine. What should I watch for while using this medication? Your condition will be monitored carefully while you are receiving this medication. You may need blood work while taking this medication. This medication can cause serious infusion reactions. To reduce the risk your care team may give you other medications to take before receiving this one. Be sure to follow the directions from your care team. This medication may increase your risk of getting an infection. Call your care team for advice if you get a fever, chills, sore throat, or other symptoms of a cold or flu. Do not treat yourself. Try to avoid being around people who are sick. Call your care team if you are around anyone with measles, chickenpox, or  if you develop sores or blisters that do not heal properly. Avoid taking medications that contain aspirin, acetaminophen, ibuprofen, naproxen, or ketoprofen unless instructed by your care team. These medications may hide a fever. This medication may cause serious skin reactions. They can happen weeks to months after starting the medication. Contact your care team right away if you notice fevers or flu-like symptoms with a rash. The rash may be red or purple and then turn into blisters or peeling of the skin. You may also notice a red rash with swelling of the face, lips, or lymph nodes in your neck or under your arms. In some patients, this medication may cause a serious brain infection that may cause death. If you have any problems seeing, thinking, speaking, walking, or standing, tell your care team right away. If you cannot reach your care team, urgently seek another source of medical care. Talk to your care team if you may be pregnant. Serious birth defects can occur if you take this medication during pregnancy and for 12 months after the last dose. You will need a negative pregnancy test before starting this medication. Contraception is recommended while taking this medication and for 12 months after the last dose. Your care team can help you find the option that works for you. Do not breastfeed while taking this medication and for at least 6 months after the last dose. What side effects may I notice from receiving this medication? Side effects that you should report to your care team as soon as possible: Allergic reactions or angioedema--skin rash, itching or hives, swelling of the face, eyes, lips, tongue, arms, or legs, trouble swallowing or breathing Bowel blockage--stomach cramping, unable to have a bowel movement or pass gas, loss of appetite, vomiting Dizziness, loss of balance or coordination, confusion or trouble speaking Heart attack--pain or tightness in the chest, shoulders, arms, or jaw,  nausea, shortness of breath, cold or clammy skin, feeling faint or lightheaded Heart rhythm changes--fast or irregular heartbeat, dizziness, feeling faint or lightheaded, chest pain, trouble breathing Infection--fever, chills, cough, sore throat, wounds that don't heal, pain or trouble when passing urine, general feeling of discomfort or being unwell Infusion reactions--chest pain, shortness of breath or  trouble breathing, feeling faint or lightheaded Kidney injury--decrease in the amount of urine, swelling of the ankles, hands, or feet Liver injury--right upper belly pain, loss of appetite, nausea, light-colored stool, dark yellow or brown urine, yellowing skin or eyes, unusual weakness or fatigue Redness, blistering, peeling, or loosening of the skin, including inside the mouth Stomach pain that is severe, does not go away, or gets worse Tumor lysis syndrome (TLS)--nausea, vomiting, diarrhea, decrease in the amount of urine, dark urine, unusual weakness or fatigue, confusion, muscle pain or cramps, fast or irregular heartbeat, joint pain Side effects that usually do not require medical attention (report to your care team if they continue or are bothersome): Headache Joint pain Nausea Runny or stuffy nose Unusual weakness or fatigue This list may not describe all possible side effects. Call your doctor for medical advice about side effects. You may report side effects to FDA at 1-800-FDA-1088. Where should I keep my medication? This medication is given in a hospital or clinic. It will not be stored at home. NOTE: This sheet is a summary. It may not cover all possible information. If you have questions about this medicine, talk to your doctor, pharmacist, or health care provider.  2024 [employer] (2022-04-29 00:00:00)

## 2023-08-26 NOTE — Telephone Encounter (Signed)
Dental clearance form faxed to [facility]. Transmission log confirmation received.

## 2023-08-26 NOTE — Progress Notes (Signed)
[facility] [phone]   [phone]  PROGRESS NOTE  Patient Care Team: Orpha Bur, MD as PCP - General (Family Medicine) Quintella Reichert, MD as PCP - Cardiology (Cardiology) Crist Fat, MD as Attending Physician (Urology) Margaretmary Dys, MD as Consulting Physician (Radiation Oncology) Maryclare Labrador, RN as Registered Nurse Jaci Standard, MD as Consulting Physician (Hematology and Oncology)  Hematological/Oncological History # Diffuse Large B Cell Lymphoma, Stage III Presented to PCP with swelling and redness in his right leg x 3 weeks. 06/13/2023: Doppler US showed acute DVT involving right  common femoral vein, SF junction, right femoral vein, right proximal profunda vein, and external iliac vein. Incident finding included pelvic lesion with minimum vascularity anterior to the prostate, measuring 11.8 x 9.6 12.6 cm, with a volume of 749.3 cubic centimeters. Prior MR on 04/11/2021 reports 1.7 x .07 x 1.1 cm category 4 lesion. This lesion appears to be causing extrinsic compression on the external iliac vein (proximal external iliac vein not visualized due to extrinsic compression).  06/16/2023: CT angio abdomen/pelvis: Large lobulated mass in the right pelvis encasing the right iliac vessels and abutting the anterior pelvic wall as well as right iliacus muscle and right bladder dome. This mass consistent with a neoplastic process and features most suggestive of lymphoma.Multiple splenic lesions most consistent with metastatic disease.Short segment thick wall small bowel in the mid abdomen to the right of midline most consistent with infiltrative malignancy/lymphoma. Right pelvic sidewall mass/adenopathy.Cholelithiasis. Colonic diverticulosis. Aortic Atherosclerosis. 06/21/2023: Establ[facility] care with [facility].  06/24/2023: US guided biopsy of abdominal mass. Pathology is consistent with B-cell lymphoma with a high proliferation rate of  80-90%. Morphologically the findings are most consistent with  a diffuse large B-cell lymphoma. FISH studies r/o Burkitt's lymphoma or double hit lymphoma. 07/15/2023: Cycle 1 Day 1 of R-CHOP chemotherapy.  08/05/2023: Cycle 2 Day 1 of R-CHOP chemotherapy.  08/26/2023: Cycle 3 Day 1 of R-CHOP chemotherapy.  Interval History:  Eugene Moreno 77 y.o. male with medical history significant for diffuse large B-cell lymphoma who presents for a follow up visit. The patient's last visit was on 08/05/2023. In the interim, he continues on R-CHOP chemotherapy. He presents today prior to Cycle 3, Day 1. He is accompanied by his wife for this visit.   On exam today Eugene Moreno reports that he presents to the ED on 8/28 for right sided abdominal pain. CT imaging showed findings concerning for enteritis. He was discharged on 7 day course of cipro plus flagyl. He reports the pain and significantly improved since then. He takes his prescribed pain medication twice a day as needed. He has noticed a rash on his chest and forehead for the last two days. The rash is not itchy and spreading. He denies nausea, vomiting or bowel habit changes. He denies easy bruising or signs of active bleeding. He denies fevers, chills, sweats, shortness of breath, chest pain, cough, headaches, dizziness or peripheral neuropathy. Overall he is willing and able to proceed with chemotherapy today.  A full 10 point ROS is otherwise negative.  MEDICAL HISTORY:  Past Medical History:  Diagnosis Date   Coronary artery disease    cardiologist--- dr t. turner;  hx AWMI 09-06-2001  s/p cath with PCI and DES x1 to prox LAD;  last nuclear study 05-10-2016 in epic, anterior scar without ishcemia, nuclear ef 44%)   History of MI (myocardial infarction) 09/06/2001   anterior wall   Hyperlipemia  Hypertension    Leukopenia    chronic mild   Non Hodgkin's lymphoma (HCC)    Prostate cancer [facility])    urologist-- dr Marlou Porch--- dx 05/ 2022, Gleason  4+3, PSA 8.04   S/P drug eluting coronary stent placement 09/06/2001   x1 to prox LAD   Wears glasses    Wears hearing aid in both ears     SURGICAL HISTORY: Past Surgical History:  Procedure Laterality Date   ANTERIOR CERVICAL DECOMP/DISCECTOMY FUSION  01/26/2002   @[facility]  by dr Jeral Fruit;   C6-C7   COLONOSCOPY  2020   CORONARY ANGIOPLASTY WITH STENT PLACEMENT  09/06/2001   @[facility]  by dr s. 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   SPACE OAR INSTILLATION N/A 09/03/2021   Procedure: SPACE OAR INSTILLATION;  Surgeon: Crist Fat, MD;  Location: [facility];  Service: Urology;  Laterality: N/A;   WISDOM TOOTH EXTRACTION      SOCIAL HISTORY: Social History   Socioeconomic History   Marital status: Married    Spouse name: Not on file   Number of children: Not on file   Years of education: Not on file   Highest education level: Not on file  Occupational History   Occupation: retired    Associate Professor: [employer]    Comment: Systems analyst  Tobacco Use   Smoking status: Former    Current packs/day: 0.00    Types: Cigarettes    Start date: 1965    Quit date: 1969    Years since quitting: 55.7   Smokeless tobacco: Never  Vaping Use   Vaping status: Never Used  Substance and Sexual Activity   Alcohol use: Yes    Comment: occasional   Drug use: Never   Sexual activity: Yes    Comment: vasectomy  Other Topics Concern   Not on file  Social History Narrative   Not on file   Social Determinants of Health   Financial Resource Strain: Not on file  Food Insecurity: No Food Insecurity (06/21/2023)   Hunger Vital Sign     Worried About Running Out of Food in the Last Year: Never true    Ran Out of Food in the Last Year: Never true  Transportation Needs: No Transportation Needs (06/21/2023)   PRAPARE - Administrator, Civil Service (Medical): No    Lack of Transportation (Non-Medical): No  Physical Activity: Not on file  Stress: Not on file  Social Connections: Not on file  Intimate Partner Violence: Not At Risk (06/21/2023)   Humiliation, Afraid, Rape, and Kick questionnaire    Fear of Current or Ex-Partner: No    Emotionally Abused: No    Physically Abused: No    Sexually Abused: No    FAMILY HISTORY: Family History  Problem Relation Age of Onset   Breast cancer Mother 72   Heart attack Father        x3 between ages 4-64   Other Sister 90       angioplasty    ALLERGIES:  is allergic to niacin and related.  MEDICATIONS:  Current Outpatient Medications  Medication Sig Dispense Refill  allopurinol (ZYLOPRIM) 300 MG tablet Take 1 tablet (300 mg total) by mouth daily. 90 tablet 1   apixaban (ELIQUIS) 5 MG TABS tablet Take 1 tablet (5 mg total) by mouth 2 (two) times daily. Start taking after completion of starter pack. 60 tablet 5   aspirin EC 81 MG tablet Take 2 tablets (162 mg total) by mouth daily.     Cod Liver Oil CAPS Take 2 capsules by mouth daily.     ferrous fumarate (HEMOCYTE - 106 MG FE) 325 (106 FE) MG TABS Take 1 tablet by mouth daily.     fluticasone (FLONASE) 50 MCG/ACT nasal spray Place 1 spray into both nostrils 2 (two) times daily.     folic acid (FOLVITE) 800 MCG tablet Take 800 mcg by mouth daily.      icosapent Ethyl (VASCEPA) 1 g capsule Take 2 capsules (2 g total) by mouth 2 (two) times daily. 120 capsule 8   lidocaine-prilocaine (EMLA) cream Apply 1 Application topically as needed. 30 g 0   predniSONE (DELTASONE) 20 MG tablet Take 3 tablets (60 mg total) by mouth daily with breakfast. 15 tablet 5   ramipril (ALTACE) 2.5 MG capsule TAKE 1 CAPSULE BY MOUTH EVERY DAY  90 capsule 3   rosuvastatin (CRESTOR) 10 MG tablet TAKE 1 TABLET BY MOUTH EVERY DAY 90 tablet 3   vitamin C (ASCORBIC ACID) 500 MG tablet Take 1,000 mg by mouth daily.      vitamin E 400 UNIT capsule Take 400 Units by mouth daily.     APIXABAN (ELIQUIS) VTE STARTER PACK (10MG  AND 5MG ) Take as directed on package: start with two-5mg  tablets twice daily for 7 days. On day 8, switch to one-5mg  tablet twice daily. 74 each 0   Cetirizine HCl (ZYRTEC ALLERGY) 10 MG CAPS Take 1 capsule by mouth daily.     ciprofloxacin (CIPRO) 500 MG tablet Take 1 tablet (500 mg total) by mouth 2 (two) times daily. One po bid x 7 days (Patient not taking: Reported on 08/26/2023) 14 tablet 0   enoxaparin (LOVENOX) 100 MG/ML injection Inject 0.9 mLs (90 mg total) into the skin every 12 (twelve) hours for 2 days. Hold injection 12 hours before port placement 3.6 mL 0   HYDROcodone-acetaminophen (NORCO/VICODIN) 5-325 MG tablet Take 1 tablet by mouth every 6 (six) hours as needed. 60 tablet 0   metroNIDAZOLE (FLAGYL) 500 MG tablet Take 1 tablet (500 mg total) by mouth 3 (three) times daily. One po tid x 7 days (Patient not taking: Reported on 08/26/2023) 21 tablet 0   ondansetron (ZOFRAN) 8 MG tablet Take 1 tablet (8 mg total) by mouth every 8 (eight) hours as needed. (Patient not taking: Reported on 08/26/2023) 30 tablet 0   prochlorperazine (COMPAZINE) 10 MG tablet Take 1 tablet (10 mg total) by mouth every 6 (six) hours as needed for nausea or vomiting. (Patient not taking: Reported on 08/26/2023) 30 tablet 0   No current facility-administered medications for this visit.   Facility-Administered Medications Ordered in Other Visits  Medication Dose Route Frequency Provider Last Rate Last Admin   0.9 %  sodium chloride infusion   Intravenous Once Jaci Standard, MD       acetaminophen (TYLENOL) tablet 650 mg  650 mg Oral Once Jaci Standard, MD       cyclophosphamide (CYTOXAN) 1,500 mg in sodium chloride 0.9 % 250 mL chemo  infusion  750 mg/m2 (Treatment Plan Recorded) Intravenous Once Ulysees Barns IV,  MD       dexamethasone (DECADRON) 10 mg in sodium chloride 0.9 % 50 mL IVPB  10 mg Intravenous Once Jaci Standard, MD       diphenhydrAMINE (BENADRYL) capsule 50 mg  50 mg Oral Once Jaci Standard, MD       DOXOrubicin (ADRIAMYCIN) chemo injection 106 mg  50 mg/m2 (Treatment Plan Recorded) Intravenous Once Jaci Standard, MD       fosaprepitant (EMEND) 150 mg in sodium chloride 0.9 % 145 mL IVPB  150 mg Intravenous Once Ulysees Barns IV, MD       heparin lock flush 100 unit/mL  500 Units Intracatheter Once PRN Jaci Standard, MD       palonosetron (ALOXI) injection 0.25 mg  0.25 mg Intravenous Once Jaci Standard, MD       riTUXimab-pvvr (RUXIENCE) 800 mg in sodium chloride 0.9 % 250 mL (2.4242 mg/mL) infusion  375 mg/m2 (Treatment Plan Recorded) Intravenous Once Jaci Standard, MD       sodium chloride flush (NS) 0.9 % injection 10 mL  10 mL Intracatheter PRN Jaci Standard, MD       vinCRIStine (ONCOVIN) 2 mg in sodium chloride 0.9 % 50 mL chemo infusion  2 mg Intravenous Once Jaci Standard, MD        REVIEW OF SYSTEMS:   Constitutional: ( - ) fevers, ( - )  chills , ( - ) night sweats Eyes: ( - ) blurriness of vision, ( - ) double vision, ( - ) watery eyes Ears, nose, mouth, throat, and face: ( - ) mucositis, ( - ) sore throat Respiratory: ( - ) cough, ( - ) dyspnea, ( - ) wheezes Cardiovascular: ( - ) palpitation, ( - ) chest discomfort, ( - ) lower extremity swelling Gastrointestinal:  ( - ) nausea, ( - ) heartburn, ( - ) change in bowel habits Skin: ( - ) abnormal skin rashes Lymphatics: ( - ) new lymphadenopathy, ( - ) easy bruising Neurological: ( - ) numbness, ( - ) tingling, ( - ) new weaknesses Behavioral/Psych: ( - ) mood change, ( - ) new changes  All other systems were reviewed with the patient and are negative.  PHYSICAL EXAMINATION: ECOG PERFORMANCE STATUS: 1 -  Symptomatic but completely ambulatory  Vitals:   08/26/23 1031  BP: 105/71  Pulse: 75  Resp: 18  Temp: (!) 97.5 F (36.4 C)  SpO2: 90%   Filed Weights   08/26/23 1031  Weight: 189 lb 14.4 oz (86.1 kg)    GENERAL: Well-appearing elderly Caucasian male, alert, no distress and comfortable SKIN: skin color, texture, turgor are normal, significant lesions. Mild erythematous rash on chest and forehead.  EYES: conjunctiva are pink and non-injected, sclera clear LUNGS: clear to auscultation and percussion with normal breathing effort HEART: regular rate & rhythm and no murmurs and no lower extremity edema Musculoskeletal: no cyanosis of digits and no clubbing  PSYCH: alert & oriented x 3, fluent speech NEURO: no focal motor/sensory deficits  LABORATORY DATA:  I have reviewed the data as listed    Latest Ref Rng & Units 08/26/2023   10:01 AM 08/17/2023    7:33 PM 08/05/2023    8:48 AM  CBC  WBC 4.0 - 10.5 K/uL 5.9  14.9  6.2   Hemoglobin 13.0 - 17.0 g/dL 16.1  09.6  04.5   Hematocrit 39.0 - 52.0 % 34.7  37.4  37.2   Platelets 150 - 400 K/uL 350  127  265        Latest Ref Rng & Units 08/26/2023   10:01 AM 08/17/2023   10:19 PM 08/05/2023    8:48 AM  CMP  Glucose 70 - 99 mg/dL 865  784  696   BUN 8 - 23 mg/dL 17  15  13    Creatinine 0.61 - 1.24 mg/dL 2.95  2.84  1.32   Sodium 135 - 145 mmol/L 138  135  138   Potassium 3.5 - 5.1 mmol/L 3.9  4.0  4.1   Chloride 98 - 111 mmol/L 106  104  106   CO2 22 - 32 mmol/L 24  21  25    Calcium 8.9 - 10.3 mg/dL 9.0  8.2  8.8   Total Protein 6.5 - 8.1 g/dL 7.0  6.7  7.4   Total Bilirubin 0.3 - 1.2 mg/dL 0.3  0.5  0.3   Alkaline Phos 38 - 126 U/L 57  74  80   AST 15 - 41 U/L 20  21  16    ALT 0 - 44 U/L 18  23  20      Lab Results  Component Value Date   MPROTEIN Not Observed 11/05/2016   Lab Results  Component Value Date   KPAFRELGTCHN 2.46 (H) 11/03/2015   KPAFRELGTCHN 2.86 (H) 09/16/2011   KPAFRELGTCHN 2.86 (H) 09/16/2011    LAMBDASER 1.71 11/03/2015   LAMBDASER 2.65 (H) 09/16/2011   LAMBDASER 2.65 (H) 09/16/2011   KAPLAMBRATIO 1.25 11/05/2016   KAPLAMBRATIO 1.44 11/03/2015   KAPLAMBRATIO 1.08 09/16/2011   KAPLAMBRATIO 1.08 09/16/2011    RADIOGRAPHIC STUDIES: CT Angio Abd/Pel W and/or Wo Contrast  Result Date: 08/17/2023 CLINICAL DATA:  Right lower quadrant pain. Mesenteric ischemia. Known right iliac mass. Lymphoma. EXAM: CTA ABDOMEN AND PELVIS WITHOUT AND WITH CONTRAST TECHNIQUE: Multidetector CT imaging of the abdomen and pelvis was performed using the standard protocol during bolus administration of intravenous contrast. Multiplanar reconstructed images and MIPs were obtained and reviewed to evaluate the vascular anatomy. RADIATION DOSE REDUCTION: This exam was performed according to the departmental dose-optimization program which includes automated exposure control, adjustment of the mA and/or kV according to patient size and/or use of iterative reconstruction technique. CONTRAST:  OMNIPAQUE IOHEXOL 350 MG/ML SOLN COMPARISON:  PET-CT 07/06/2023. FINDINGS: VASCULAR Aorta: Normal caliber aorta without aneurysm, dissection, vasculitis or significant stenosis. There is mild calcified atherosclerotic disease. Celiac: Patent without evidence of aneurysm, dissection, vasculitis or significant stenosis. SMA: Patent without evidence of aneurysm, dissection, vasculitis or significant stenosis. Renals: Both renal arteries are patent without evidence of aneurysm, dissection, vasculitis, fibromuscular dysplasia or significant stenosis. IMA: Patent without evidence of aneurysm, dissection, vasculitis or significant stenosis. Inflow: Patent without evidence of aneurysm, dissection, vasculitis or significant stenosis. Proximal Outflow: Bilateral common femoral and visualized portions of the superficial and profunda femoral arteries are patent without evidence of aneurysm, dissection, vasculitis or significant stenosis. Veins: No  obvious venous abnormality within the limitations of this arterial phase study. Review of the MIP images confirms the above findings. NON-VASCULAR Lower chest: No acute abnormality. Hepatobiliary: Gallstones are present. There is no biliary ductal dilatation. No focal liver lesion identified. Pancreas: Unremarkable. No pancreatic ductal dilatation or surrounding inflammatory changes. Spleen: There are 2 rounded hypodense lesions in the spleen measuring up 2 13 mm which are indeterminate which have decreased in size and number compared to the prior study. Adrenals/Urinary Tract: Bilateral renal cysts are again  noted measuring up to 1.9 cm on the left. There is no hydronephrosis or perinephric stranding. The adrenal glands and bladder are within normal limits. Stomach/Bowel: There is wall thickening and inflammation involving a small bowel loop in the anterior mid right abdomen corresponding to patient's small bowel seen on the prior study. There is no evidence for bowel obstruction or free air. Colonic diverticula are again noted. The appendix and stomach are within normal limits. Lymphatic: Bulky central mesenteric lymphadenopathy has significantly decreased with the larger areas resolved. Enlarged right lower quadrant mesenteric lymph nodes persists measuring up to 2.1 x 1.5 cm, decreased in size. Right iliac chain mass again seen, decreased in size from prior now measuring 6.5 x 7.8 by 6.5 cm. This mass surrounds the inferior epigastric artery as seen on the prior study. There some central areas of hypodensity in this mass which may represent necrosis. Reproductive: Prostate radiotherapy seeds present. Other: No ascites.  No focal abdominal wall hernia. Musculoskeletal: No acute or significant osseous findings. IMPRESSION: 1. No evidence for aortic dissection or aneurysm. 2. No evidence for mesenteric ischemia. NON-VASCULAR 1. Wall thickening and inflammation of a small bowel loop in the anterior mid right abdomen  worrisome for enteritis. 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 No bowel obstruction or free air. 2. Right iliac chain mass has decreased in size from prior. New central hypodensity in the mass may represent necrosis. Infection not excluded. 3. Mesenteric lymphadenopathy has significantly decreased. 4. Cholelithiasis. 5. Hypodense lesions in the spleen have decreased in size and number compared to the prior study. 6. Left Bosniak I benign renal cyst measuring 1.9 cm. No follow-up imaging is recommended. JACR 2018 Feb; 264-273, Management of the Incidental Renal Mass on CT, [employer] 2021; 814-848, Bosniak Classification of Cystic Renal Masses, Version 2019. Electronically Signed   By: Darliss Cheney M.D.   On: 08/17/2023 23:42    ASSESSMENT & PLAN Eugene Moreno is a 77 y.o.. male with medical history significant for diffuse large B-cell lymphoma who presents for a follow up visit.   #Diffuse Large B-cell Lymphoma, Stage III --Underwent US guided biopsy of pelvic mass on 06/24/2023.  Pathology is consistent with B-cell lymphoma with a high proliferation rate of 80-90%. Morphologically the findings are most consistent with a diffuse large B-cell lymphoma. FISH studies r/o Burkitt's lymphoma or double hit lymphoma. --Pre treatment PET CT scan on 07/06/2023 showed right external iliac nodal chain measuring 13.9 x 12.1 cm with an SUV max of 34.34. Multiple tracer avid lymph nodes are identified within the chest, abdomen and pelvis compatible with lymphoma. Consistent with Stage III disease.  PLAN: --Due for Cycle 3, Day 1 of R-CHOP chemotherapy. --Labs today show white blood cell 5.9, hemoglobin 11.4, MCV 95.3, and platelets of 350K --Proceed with treatment today without any dose modifications.  --Scheduled for interval PET CT scan on 09/09/2023.  --RTC in 3 weeks for labs, follow up before Cycle 4, Day 1.    #Rash: --Possibly related to antibiotic therapy --Patient is  currently taking claritin daily. We will monitor his rash while he takes his prednisone over the next few days.   #Right groin pain: --Pain has improved and well controlled with Norco 5-325 mg BID as needed. Refill sent today.   #H/O DVT involving right  --Diagnosed 06/12/2022, secondary to underlying lymphoma --Currently on Eliquis 5 mg twice daily  #Supportive Care -- chemotherapy education complete -- port placed -- zofran 8mg  q8H PRN and compazine  10mg  PO q6H for nausea -- allopurinol 300mg  PO daily for TLS prophylaxis -- EMLA cream for port -- no pain medication required at this time.    No orders of the defined types were placed in this encounter.   All questions were answered. The patient knows to call the clinic with any problems, questions or concerns.  I have spent a total of 30 minutes minutes of face-to-face and non-face-to-face time, preparing to see the patient,performing a medically appropriate examination, counseling and educating the patient, ordering tests/procedures, documenting clinical information in the electronic health record, independently interpreting results and communicating results to the patient, and care coordination.   Georga Kaufmann PA-C Dept of Hematology and Oncology [facility] Phone: [phone]   08/26/2023 11:22 AM

## 2023-08-26 NOTE — Progress Notes (Signed)
Rapid Infusion Rituximab Pharmacist Evaluation  Eugene Moreno is a 77 y.o. male being treated with rituximab for Non Hodgkins Lymphoma. This patient may be considered for RIR.   A pharmacist has verified the patient tolerated rituximab infusions per the [facility] standard infusion protocol without grade 3-4 infusion reactions. The treatment plan will be updated to reflect RIR if the patient qualifies per the checklist below:   Age > 39 years old Yes   Clinically significant cardiovascular disease Yes   Circulating lymphocyte count < 5000/uL prior to cycle two No  Lab Results  Component Value Date   LYMPHSABS 0.2 (L) 08/26/2023    Prior documented grade 3-4 infusion reaction to rituximab No   Prior documented grade 1-2 infusion reaction to rituximab (If YES, Pharmacist will confirm with Physician if patient is still a candidate for RIR) No   Previous rituximab infusion within the past 6 months Yes   Treatment Plan updated orders to reflect RIR No    Eugene Moreno does not meet the criteria for Rapid Infusion Rituximab. This patient is not going to be switched to rapid infusion rituximab.   Eugene Moreno 08/26/23 11:51 AM

## 2023-08-28 ENCOUNTER — Encounter: Payer: Self-pay | Admitting: Physician Assistant

## 2023-08-29 ENCOUNTER — Encounter: Payer: Self-pay | Admitting: Hematology and Oncology

## 2023-08-29 ENCOUNTER — Other Ambulatory Visit: Payer: Self-pay | Admitting: Physician Assistant

## 2023-08-29 ENCOUNTER — Other Ambulatory Visit (HOSPITAL_BASED_OUTPATIENT_CLINIC_OR_DEPARTMENT_OTHER): Payer: Self-pay

## 2023-08-29 ENCOUNTER — Inpatient Hospital Stay: Payer: Medicare Other

## 2023-08-29 VITALS — BP 112/70 | HR 62 | Temp 97.7°F | Resp 16

## 2023-08-29 DIAGNOSIS — C8338 Diffuse large B-cell lymphoma, lymph nodes of multiple sites: Secondary | ICD-10-CM

## 2023-08-29 DIAGNOSIS — Z5112 Encounter for antineoplastic immunotherapy: Secondary | ICD-10-CM | POA: Diagnosis not present

## 2023-08-29 DIAGNOSIS — Z95828 Presence of other vascular implants and grafts: Secondary | ICD-10-CM

## 2023-08-29 MED ORDER — PEGFILGRASTIM-CBQV 6 MG/0.6ML ~~LOC~~ SOSY
6.0000 mg | PREFILLED_SYRINGE | Freq: Once | SUBCUTANEOUS | Status: AC
  Administered 2023-08-29: 6 mg via SUBCUTANEOUS
  Filled 2023-08-29: qty 0.6

## 2023-08-29 MED ORDER — HYDROCODONE-ACETAMINOPHEN 5-325 MG PO TABS
1.0000 | ORAL_TABLET | Freq: Four times a day (QID) | ORAL | 0 refills | Status: DC | PRN
  Filled 2023-08-29: qty 60, 15d supply, fill #0

## 2023-08-29 MED ORDER — SODIUM CHLORIDE 0.9% FLUSH: 10.0000 mL | Freq: Once | INTRAVENOUS | Status: DC

## 2023-09-09 ENCOUNTER — Ambulatory Visit (HOSPITAL_COMMUNITY)
Admission: RE | Admit: 2023-09-09 | Discharge: 2023-09-09 | Disposition: A | Discharge status: Home / Self Care | Payer: Medicare Other | Source: Ambulatory Visit | Attending: Physician Assistant

## 2023-09-09 DIAGNOSIS — C8338 Diffuse large B-cell lymphoma, lymph nodes of multiple sites: Secondary | ICD-10-CM | POA: Diagnosis present

## 2023-09-09 LAB — GLUCOSE, CAPILLARY: Glucose-Capillary: 99 mg/dL (ref 70–99)

## 2023-09-09 MED ORDER — FLUDEOXYGLUCOSE F - 18 (FDG) INJECTION
9.3200 | Freq: Once | INTRAVENOUS | Status: AC
  Administered 2023-09-09: 9.32 via INTRAVENOUS

## 2023-09-14 ENCOUNTER — Encounter: Payer: Self-pay | Admitting: Hematology and Oncology

## 2023-09-14 MED FILL — Dexamethasone Sodium Phosphate Inj 100 MG/10ML: INTRAMUSCULAR | Qty: 1 | Status: AC

## 2023-09-14 MED FILL — Fosaprepitant Dimeglumine For IV Infusion 150 MG (Base Eq): INTRAVENOUS | Qty: 5 | Status: AC

## 2023-09-15 ENCOUNTER — Inpatient Hospital Stay: Payer: Medicare Other | Admitting: Hematology and Oncology

## 2023-09-15 ENCOUNTER — Inpatient Hospital Stay: Payer: Medicare Other

## 2023-09-15 ENCOUNTER — Encounter: Payer: Self-pay | Admitting: Hematology and Oncology

## 2023-09-15 VITALS — BP 92/59 | HR 70 | Temp 97.7°F | Resp 16

## 2023-09-15 VITALS — BP 99/71 | HR 78 | Temp 97.5°F | Resp 14 | Wt 189.7 lb

## 2023-09-15 DIAGNOSIS — C8338 Diffuse large B-cell lymphoma, lymph nodes of multiple sites: Secondary | ICD-10-CM

## 2023-09-15 DIAGNOSIS — Z5112 Encounter for antineoplastic immunotherapy: Secondary | ICD-10-CM | POA: Diagnosis not present

## 2023-09-15 DIAGNOSIS — Z95828 Presence of other vascular implants and grafts: Secondary | ICD-10-CM

## 2023-09-15 LAB — CMP (CANCER CENTER ONLY)
ALT: 22 U/L (ref 0–44)
AST: 20 U/L (ref 15–41)
Albumin: 4.1 g/dL (ref 3.5–5.0)
Alkaline Phosphatase: 70 U/L (ref 38–126)
Anion gap: 7 (ref 5–15)
BUN: 20 mg/dL (ref 8–23)
CO2: 25 mmol/L (ref 22–32)
Calcium: 8.9 mg/dL (ref 8.9–10.3)
Chloride: 105 mmol/L (ref 98–111)
Creatinine: 0.75 mg/dL (ref 0.61–1.24)
GFR, Estimated: >60 mL/min (ref >60)
Glucose, Bld: 124 mg/dL — ABNORMAL HIGH (ref 70–99)
Potassium: 3.9 mmol/L (ref 3.5–5.1)
Sodium: 137 mmol/L (ref 135–145)
Total Bilirubin: 0.4 mg/dL (ref 0.3–1.2)
Total Protein: 7 g/dL (ref 6.5–8.1)

## 2023-09-15 LAB — CBC WITH DIFFERENTIAL (CANCER CENTER ONLY)
Abs Immature Granulocytes: 0.07 10*3/uL (ref 0.00–0.07)
Basophils Absolute: 0 10*3/uL (ref 0.0–0.1)
Basophils Relative: 1 %
Eosinophils Absolute: 0 10*3/uL (ref 0.0–0.5)
Eosinophils Relative: 1 %
HCT: 33.9 % — ABNORMAL LOW (ref 39.0–52.0)
Hemoglobin: 11.7 g/dL — ABNORMAL LOW (ref 13.0–17.0)
Immature Granulocytes: 1 %
Lymphocytes Relative: 4 %
Lymphs Abs: 0.3 10*3/uL — ABNORMAL LOW (ref 0.7–4.0)
MCH: 33.2 pg (ref 26.0–34.0)
MCHC: 34.5 g/dL (ref 30.0–36.0)
MCV: 96.3 fL (ref 80.0–100.0)
Monocytes Absolute: 0.2 10*3/uL (ref 0.1–1.0)
Monocytes Relative: 3 %
Neutro Abs: 5.6 10*3/uL (ref 1.7–7.7)
Neutrophils Relative %: 90 %
Platelet Count: 177 10*3/uL (ref 150–400)
RBC: 3.52 MIL/uL — ABNORMAL LOW (ref 4.22–5.81)
RDW: 16.7 % — ABNORMAL HIGH (ref 11.5–15.5)
WBC Count: 6.1 10*3/uL (ref 4.0–10.5)
nRBC: 0 % (ref 0.0–0.2)

## 2023-09-15 MED ORDER — SODIUM CHLORIDE 0.9% FLUSH
10.0000 mL | Freq: Once | INTRAVENOUS | Status: AC
  Administered 2023-09-15: 10 mL

## 2023-09-15 MED ORDER — HEPARIN SOD (PORK) LOCK FLUSH 100 UNIT/ML IV SOLN
500.0000 [IU] | Freq: Once | INTRAVENOUS | Status: AC | PRN
  Administered 2023-09-15: 500 [IU]

## 2023-09-15 MED ORDER — SODIUM CHLORIDE 0.9 % IV SOLN
375.0000 mg/m2 | Freq: Once | INTRAVENOUS | Status: AC
  Administered 2023-09-15: 800 mg via INTRAVENOUS
  Filled 2023-09-15: qty 50

## 2023-09-15 MED ORDER — SODIUM CHLORIDE 0.9 % IV SOLN
150.0000 mg | Freq: Once | INTRAVENOUS | Status: AC
  Administered 2023-09-15: 150 mg via INTRAVENOUS
  Filled 2023-09-15: qty 150

## 2023-09-15 MED ORDER — SODIUM CHLORIDE 0.9% FLUSH
10.0000 mL | INTRAVENOUS | Status: DC | PRN
  Administered 2023-09-15: 10 mL

## 2023-09-15 MED ORDER — CIPROFLOXACIN HCL 500 MG PO TABS: 500.0000 mg | ORAL_TABLET | Freq: Two times a day (BID) | ORAL | 0 refills | Status: DC

## 2023-09-15 MED ORDER — DIPHENHYDRAMINE HCL 25 MG PO CAPS
50.0000 mg | ORAL_CAPSULE | Freq: Once | ORAL | Status: AC
  Administered 2023-09-15: 50 mg via ORAL
  Filled 2023-09-15: qty 2

## 2023-09-15 MED ORDER — VINCRISTINE SULFATE CHEMO INJECTION 1 MG/ML
2.0000 mg | Freq: Once | INTRAVENOUS | Status: AC
  Administered 2023-09-15: 2 mg via INTRAVENOUS
  Filled 2023-09-15: qty 2

## 2023-09-15 MED ORDER — PALONOSETRON HCL INJECTION 0.25 MG/5ML
0.2500 mg | Freq: Once | INTRAVENOUS | Status: AC
  Administered 2023-09-15: 0.25 mg via INTRAVENOUS
  Filled 2023-09-15: qty 5

## 2023-09-15 MED ORDER — SODIUM CHLORIDE 0.9 % IV SOLN: Freq: Once | INTRAVENOUS | Status: AC

## 2023-09-15 MED ORDER — SODIUM CHLORIDE 0.9 % IV SOLN
750.0000 mg/m2 | Freq: Once | INTRAVENOUS | Status: AC
  Administered 2023-09-15: 1500 mg via INTRAVENOUS
  Filled 2023-09-15: qty 75

## 2023-09-15 MED ORDER — DOXORUBICIN HCL CHEMO IV INJECTION 2 MG/ML
50.0000 mg/m2 | Freq: Once | INTRAVENOUS | Status: AC
  Administered 2023-09-15: 106 mg via INTRAVENOUS
  Filled 2023-09-15: qty 53

## 2023-09-15 MED ORDER — SODIUM CHLORIDE 0.9 % IV SOLN
10.0000 mg | Freq: Once | INTRAVENOUS | Status: AC
  Administered 2023-09-15: 10 mg via INTRAVENOUS
  Filled 2023-09-15: qty 10

## 2023-09-15 MED ORDER — ACETAMINOPHEN 325 MG PO TABS
650.0000 mg | ORAL_TABLET | Freq: Once | ORAL | Status: AC
  Administered 2023-09-15: 650 mg via ORAL
  Filled 2023-09-15: qty 2

## 2023-09-15 NOTE — Patient Instructions (Addendum)
[facility]  Discharge Instructions: Thank you for choosing [facility] to provide your oncology and hematology care.   If you have a lab appointment with the Cancer Center, please go directly to the Cancer Center and check in at the registration area.   Wear comfortable clothing and clothing appropriate for easy access to any Portacath or PICC line.   We strive to give you quality time with your provider. You may need to reschedule your appointment if you arrive late (15 or more minutes).  Arriving late affects you and other patients whose appointments are after yours.  Also, if you miss three or more appointments without notifying the office, you may be dismissed from the clinic at the provider's discretion.      For prescription refill requests, have your pharmacy contact our office and allow 72 hours for refills to be completed.    Today you received the following chemotherapy and/or immunotherapy agents: Adriamycin/Vincristine/Cytoxan/Ruxience      To help prevent nausea and vomiting after your treatment, we encourage you to take your nausea medication as directed.  BELOW ARE SYMPTOMS THAT SHOULD BE REPORTED IMMEDIATELY: *FEVER GREATER THAN 100.4 F (38 C) OR HIGHER *CHILLS OR SWEATING *NAUSEA AND VOMITING THAT IS NOT CONTROLLED WITH YOUR NAUSEA MEDICATION *UNUSUAL SHORTNESS OF BREATH *UNUSUAL BRUISING OR BLEEDING *URINARY PROBLEMS (pain or burning when urinating, or frequent urination) *BOWEL PROBLEMS (unusual diarrhea, constipation, pain near the anus) TENDERNESS IN MOUTH AND THROAT WITH OR WITHOUT PRESENCE OF ULCERS (sore throat, sores in mouth, or a toothache) UNUSUAL RASH, SWELLING OR PAIN  UNUSUAL VAGINAL DISCHARGE OR ITCHING   Items with * indicate a potential emergency and should be followed up as soon as possible or go to the Emergency Department if any problems should occur.  Please show the CHEMOTHERAPY ALERT CARD or  IMMUNOTHERAPY ALERT CARD at check-in to the Emergency Department and triage nurse.  Should you have questions after your visit or need to cancel or reschedule your appointment, please contact [facility]  Dept: [phone]  and follow the prompts.  Office hours are 8:00 a.m. to 4:30 p.m. Monday - Friday. Please note that voicemails left after 4:00 p.m. may not be returned until the following business day.  We are closed weekends and major holidays. You have access to a nurse at all times for urgent questions. Please call the main number to the clinic Dept: [phone] and follow the prompts.   For any non-urgent questions, you may also contact your provider using MyChart. We now offer e-Visits for anyone 75 and older to request care online for non-urgent symptoms. For details visit [email or web address].   Also download the MyChart app! Go to the app store, search "MyChart", open the app, select , and log in with your MyChart username and password.

## 2023-09-15 NOTE — Progress Notes (Signed)
[facility] [phone]   [phone]  PROGRESS NOTE  Patient Care Team: Orpha Bur, MD as PCP - General (Family Medicine) Quintella Reichert, MD as PCP - Cardiology (Cardiology) Crist Fat, MD as Attending Physician (Urology) Margaretmary Dys, MD as Consulting Physician (Radiation Oncology) Maryclare Labrador, RN as Registered Nurse Jaci Standard, MD as Consulting Physician (Hematology and Oncology)  Hematological/Oncological History # Diffuse Large B Cell Lymphoma, Stage III Presented to PCP with swelling and redness in his right leg x 3 weeks. 06/13/2023: Doppler US showed acute DVT involving right  common femoral vein, SF junction, right femoral vein, right proximal profunda vein, and external iliac vein. Incident finding included pelvic lesion with minimum vascularity anterior to the prostate, measuring 11.8 x 9.6 12.6 cm, with a volume of 749.3 cubic centimeters. Prior MR on 04/11/2021 reports 1.7 x .07 x 1.1 cm category 4 lesion. This lesion appears to be causing extrinsic compression on the external iliac vein (proximal external iliac vein not visualized due to extrinsic compression).  06/16/2023: CT angio abdomen/pelvis: Large lobulated mass in the right pelvis encasing the right iliac vessels and abutting the anterior pelvic wall as well as right iliacus muscle and right bladder dome. This mass consistent with a neoplastic process and features most suggestive of lymphoma.Multiple splenic lesions most consistent with metastatic disease.Short segment thick wall small bowel in the mid abdomen to the right of midline most consistent with infiltrative malignancy/lymphoma. Right pelvic sidewall mass/adenopathy.Cholelithiasis. Colonic diverticulosis. Aortic Atherosclerosis. 06/21/2023: Establish care with [facility].  06/24/2023: US guided biopsy of abdominal mass. Pathology is consistent with B-cell lymphoma with a high proliferation rate of  80-90%. Morphologically the findings are most consistent with  a diffuse large B-cell lymphoma. FISH studies r/o Burkitt's lymphoma or double hit lymphoma. 07/15/2023: Cycle 1 Day 1 of R-CHOP chemotherapy.  08/05/2023: Cycle 2 Day 1 of R-CHOP chemotherapy.  08/26/2023: Cycle 3 Day 1 of R-CHOP chemotherapy.  Interval History:  Eugene Moreno 77 y.o. male with medical history significant for diffuse large B-cell lymphoma who presents for a follow up visit. The patient's last visit was on 08/26/2023. In the interim, he continues on R-CHOP chemotherapy. He presents today prior to Cycle 4, Day 1. He is accompanied by his wife for this visit.   On exam today Eugene Moreno reports he feels well though he has developed some rash across his chest and arms from the chemotherapy.  He reports it is not itchy or painful.  He reports that 1 month ago he did have a severe abdominal infection and was found to have some infection of the bowel.  He reports it was "clearing up".  But now the pain is coming back.  He points to the right lower quadrant at the area where the PET CT scan was positive.  He reports that he was on ciprofloxacin previously and it was helpful.  He notes the pain is currently a dull pain.  He notes otherwise he has been applying hydrocortisone cream to the rash.  His appetite has been good and he is eating well.  He denies fevers, chills, sweats, shortness of breath, chest pain, cough, headaches, dizziness or peripheral neuropathy. Overall he is willing and able to proceed with chemotherapy today.  A full 10 point ROS is otherwise negative.  MEDICAL HISTORY:  Past Medical History:  Diagnosis Date   Coronary artery disease    cardiologist--- dr t. turner;  hx AWMI 09-06-2001  s/p cath with PCI and DES x1 to prox LAD;  last nuclear study 05-10-2016 in epic, anterior scar without ishcemia, nuclear ef 44%)   History of MI (myocardial infarction) 09/06/2001   anterior wall   Hyperlipemia     Hypertension    Leukopenia    chronic mild   Non Hodgkin's lymphoma (HCC)    Prostate cancer [facility])    urologist-- dr Marlou Porch--- dx 05/ 2022, Gleason 4+3, PSA 8.04   S/P drug eluting coronary stent placement 09/06/2001   x1 to prox LAD   Wears glasses    Wears hearing aid in both ears     SURGICAL HISTORY: Past Surgical History:  Procedure Laterality Date   ANTERIOR CERVICAL DECOMP/DISCECTOMY FUSION  01/26/2002   @[facility]  by dr Jeral Fruit;   C6-C7   COLONOSCOPY  2020   CORONARY ANGIOPLASTY WITH STENT PLACEMENT  09/06/2001   @[facility]  by dr s. 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   SPACE OAR INSTILLATION N/A 09/03/2021   Procedure: SPACE OAR INSTILLATION;  Surgeon: Crist Fat, MD;  Location: [facility];  Service: Urology;  Laterality: N/A;   WISDOM TOOTH EXTRACTION      SOCIAL HISTORY: Social History   Socioeconomic History   Marital status: Married    Spouse name: Not on file   Number of children: Not on file   Years of education: Not on file   Highest education level: Not on file  Occupational History   Occupation: retired    Associate Professor: [employer]    Comment: Systems analyst  Tobacco Use   Smoking status: Former    Current packs/day: 0.00    Types: Cigarettes    Start date: 1965    Quit date: 1969    Years since quitting: 55.7   Smokeless tobacco: Never  Vaping Use   Vaping status: Never Used  Substance and Sexual Activity   Alcohol use: Yes    Comment: occasional   Drug use: Never   Sexual activity: Yes    Comment: vasectomy  Other Topics Concern   Not on file  Social History Narrative   Not on file    Social Determinants of Health   Financial Resource Strain: Not on file  Food Insecurity: No Food Insecurity (06/21/2023)   Hunger Vital Sign    Worried About Running Out of Food in the Last Year: Never true    Ran Out of Food in the Last Year: Never true  Transportation Needs: No Transportation Needs (06/21/2023)   PRAPARE - Administrator, Civil Service (Medical): No    Lack of Transportation (Non-Medical): No  Physical Activity: Not on file  Stress: Not on file  Social Connections: Not on file  Intimate Partner Violence: Not At Risk (06/21/2023)   Humiliation, Afraid, Rape, and Kick questionnaire    Fear of Current or Ex-Partner: No    Emotionally Abused: No    Physically Abused: No    Sexually Abused: No    FAMILY HISTORY: Family History  Problem Relation Age of Onset   Breast cancer Mother 50   Heart attack Father  x3 between ages 56-64   Other Sister 107       angioplasty    ALLERGIES:  is allergic to niacin and related.  MEDICATIONS:  Current Outpatient Medications  Medication Sig Dispense Refill   ciprofloxacin (CIPRO) 500 MG tablet Take 1 tablet (500 mg total) by mouth 2 (two) times daily. 28 tablet 0   loratadine (CLARITIN) 10 MG tablet Take 10 mg by mouth daily.     allopurinol (ZYLOPRIM) 300 MG tablet Take 1 tablet (300 mg total) by mouth daily. 90 tablet 1   apixaban (ELIQUIS) 5 MG TABS tablet Take 1 tablet (5 mg total) by mouth 2 (two) times daily. Start taking after completion of starter pack. 60 tablet 5   aspirin EC 81 MG tablet Take 2 tablets (162 mg total) by mouth daily.     Cod Liver Oil CAPS Take 2 capsules by mouth daily.     ferrous fumarate (HEMOCYTE - 106 MG FE) 325 (106 FE) MG TABS Take 1 tablet by mouth daily.     fluticasone (FLONASE) 50 MCG/ACT nasal spray Place 1 spray into both nostrils 2 (two) times daily.     folic acid (FOLVITE) 800 MCG tablet Take 800 mcg by mouth daily.      HYDROcodone-acetaminophen (NORCO/VICODIN) 5-325  MG tablet Take 1 tablet by mouth every 6 (six) hours as needed. 60 tablet 0   icosapent Ethyl (VASCEPA) 1 g capsule Take 2 capsules (2 g total) by mouth 2 (two) times daily. 120 capsule 8   lidocaine-prilocaine (EMLA) cream Apply 1 Application topically as needed. 30 g 0   ondansetron (ZOFRAN) 8 MG tablet Take 1 tablet (8 mg total) by mouth every 8 (eight) hours as needed. (Patient not taking: Reported on 08/26/2023) 30 tablet 0   predniSONE (DELTASONE) 20 MG tablet Take 3 tablets (60 mg total) by mouth daily with breakfast. 15 tablet 5   prochlorperazine (COMPAZINE) 10 MG tablet Take 1 tablet (10 mg total) by mouth every 6 (six) hours as needed for nausea or vomiting. (Patient not taking: Reported on 08/26/2023) 30 tablet 0   ramipril (ALTACE) 2.5 MG capsule TAKE 1 CAPSULE BY MOUTH EVERY DAY 90 capsule 3   rosuvastatin (CRESTOR) 10 MG tablet TAKE 1 TABLET BY MOUTH EVERY DAY 90 tablet 3   vitamin C (ASCORBIC ACID) 500 MG tablet Take 1,000 mg by mouth daily.      vitamin E 400 UNIT capsule Take 400 Units by mouth daily.     No current facility-administered medications for this visit.    REVIEW OF SYSTEMS:   Constitutional: ( - ) fevers, ( - )  chills , ( - ) night sweats Eyes: ( - ) blurriness of vision, ( - ) double vision, ( - ) watery eyes Ears, nose, mouth, throat, and face: ( - ) mucositis, ( - ) sore throat Respiratory: ( - ) cough, ( - ) dyspnea, ( - ) wheezes Cardiovascular: ( - ) palpitation, ( - ) chest discomfort, ( - ) lower extremity swelling Gastrointestinal:  ( - ) nausea, ( - ) heartburn, ( - ) change in bowel habits Skin: ( - ) abnormal skin rashes Lymphatics: ( - ) new lymphadenopathy, ( - ) easy bruising Neurological: ( - ) numbness, ( - ) tingling, ( - ) new weaknesses Behavioral/Psych: ( - ) mood change, ( - ) new changes  All other systems were reviewed with the patient and are negative.  PHYSICAL EXAMINATION: ECOG PERFORMANCE STATUS: 1 -  Symptomatic but completely  ambulatory  Vitals:   09/15/23 0924  BP: 99/71  Pulse: 78  Resp: 14  Temp: (!) 97.5 F (36.4 C)  SpO2: 98%    Filed Weights   09/15/23 0924  Weight: 189 lb 11.2 oz (86 kg)     GENERAL: Well-appearing elderly Caucasian male, alert, no distress and comfortable SKIN: skin color, texture, turgor are normal, significant lesions. Mild erythematous rash on chest and forehead, chest, and arms EYES: conjunctiva are pink and non-injected, sclera clear LUNGS: clear to auscultation and percussion with normal breathing effort HEART: regular rate & rhythm and no murmurs and no lower extremity edema Musculoskeletal: no cyanosis of digits and no clubbing  PSYCH: alert & oriented x 3, fluent speech NEURO: no focal motor/sensory deficits  LABORATORY DATA:  I have reviewed the data as listed    Latest Ref Rng & Units 09/15/2023    9:05 AM 08/26/2023   10:01 AM 08/17/2023    7:33 PM  CBC  WBC 4.0 - 10.5 K/uL 6.1  5.9  14.9   Hemoglobin 13.0 - 17.0 g/dL 84.6  96.2  95.2   Hematocrit 39.0 - 52.0 % 33.9  34.7  37.4   Platelets 150 - 400 K/uL 177  350  127        Latest Ref Rng & Units 09/15/2023    9:05 AM 08/26/2023   10:01 AM 08/17/2023   10:19 PM  CMP  Glucose 70 - 99 mg/dL 841  324  401   BUN 8 - 23 mg/dL 20  17  15    Creatinine 0.61 - 1.24 mg/dL 0.27  2.53  6.64   Sodium 135 - 145 mmol/L 137  138  135   Potassium 3.5 - 5.1 mmol/L 3.9  3.9  4.0   Chloride 98 - 111 mmol/L 105  106  104   CO2 22 - 32 mmol/L 25  24  21    Calcium 8.9 - 10.3 mg/dL 8.9  9.0  8.2   Total Protein 6.5 - 8.1 g/dL 7.0  7.0  6.7   Total Bilirubin 0.3 - 1.2 mg/dL 0.4  0.3  0.5   Alkaline Phos 38 - 126 U/L 70  57  74   AST 15 - 41 U/L 20  20  21    ALT 0 - 44 U/L 22  18  23      Lab Results  Component Value Date   MPROTEIN Not Observed 11/05/2016   Lab Results  Component Value Date   KPAFRELGTCHN 2.46 (H) 11/03/2015   KPAFRELGTCHN 2.86 (H) 09/16/2011   KPAFRELGTCHN 2.86 (H) 09/16/2011   LAMBDASER 1.71  11/03/2015   LAMBDASER 2.65 (H) 09/16/2011   LAMBDASER 2.65 (H) 09/16/2011   KAPLAMBRATIO 1.25 11/05/2016   KAPLAMBRATIO 1.44 11/03/2015   KAPLAMBRATIO 1.08 09/16/2011   KAPLAMBRATIO 1.08 09/16/2011    RADIOGRAPHIC STUDIES: NM PET Image Restag (PS) Skull Base To Thigh  Result Date: 09/15/2023 CLINICAL DATA:  Subsequent treatment strategy for B-cell lymphoma. EXAM: NUCLEAR MEDICINE PET SKULL BASE TO THIGH TECHNIQUE: 9.32 mCi F-18 FDG was injected intravenously. Full-ring PET imaging was performed from the skull base to thigh after the radiotracer. CT data was obtained and used for attenuation correction and anatomic localization. Fasting blood glucose: 99 mg/dl COMPARISON:  40/34/7425 FINDINGS: Mediastinal blood pool activity: SUV max 2.22 Liver activity: SUV max 3.53 NECK: No tracer avid cervical lymph nodes. Incidental CT findings: None. CHEST: No tracer avid mediastinal or hilar lymph nodes. No tracer avid axillary  or supraclavicular lymph nodes. No tracer avid pulmonary nodules. Incidental CT findings: Calcified pleural plaques overlie the left lung. Aortic atherosclerosis. Coronary artery calcifications. ABDOMEN/PELVIS: No abnormal tracer uptake identified within the liver, pancreas, spleen, or adrenal glands. No splenomegaly. No tracer avid retroperitoneal lymph nodes. Tracer avid peritoneal soft tissue along the undersurface of the right lower quadrant ventral abdominal wall is identified, image 152/4 and image 150/4. -Index lesion measures 3.5 x 1.3 cm with SUV max of 5.87, image 152/4 (Deauville criteria 5). Not seen on the previous exam. -Adjacent peritoneal nodule measures 0.9 cm with SUV max of 8.49, image 150/4 (Deauville criteria 5.) also new. -Tracer avid right abdominal mesenteric nodal mass measures 2.0 x 1.4 cm with SUV max of 6.63, image 147/4, (Deauville criteria 4). On the previous exam this measured 6.8 x 3.2 cm with SUV max of 35.05. Right external iliac nodal mass measures 7.0 x  5.1 cm with SUV max of 10.81, image 178/4 Deauville criteria 4. on the previous exam this measured 13.9 x 12.1 cm with SUV max of 34.34. The remaining sites of previously noted tracer avid tumor in the abdomen and pelvis have resolved in the interval. Incidental CT findings: Aortic atherosclerosis. Gallstones. Seed implants identified within the prostate gland. SKELETON: There is diffusely increased radiotracer activity throughout the bone marrow. No focal areas of increased radiotracer uptake above background bone marrow activity. Incidental CT findings: None. IMPRESSION: 1. There has been interval response to therapy. The majority of previously noted tracer avid tumor within the chest, abdomen and pelvis has resolved in the interval. 2. There are 2 new sites of tracer avid peritoneal soft tissue along the undersurface of the right lower quadrant ventral abdominal wall. These are concerning for new sites of lymphomatous involvement. Deauville criteria 5. 3. Decreased size and degree of tracer uptake associated with mesenteric and right external iliac nodal disease. There is residual Deauville criteria 4 tumor within these areas. 4. Diffusely increased radiotracer activity throughout the bone marrow. This is favored to represent stimulated marrow activity. 5. Coronary artery calcifications. 6. Cholelithiasis. 7.  Aortic Atherosclerosis (ICD10-I70.0). Electronically Signed   By: Signa Kell M.D.   On: 09/15/2023 05:00   CT Angio Abd/Pel W and/or Wo Contrast  Result Date: 08/17/2023 CLINICAL DATA:  Right lower quadrant pain. Mesenteric ischemia. Known right iliac mass. Lymphoma. EXAM: CTA ABDOMEN AND PELVIS WITHOUT AND WITH CONTRAST TECHNIQUE: Multidetector CT imaging of the abdomen and pelvis was performed using the standard protocol during bolus administration of intravenous contrast. Multiplanar reconstructed images and MIPs were obtained and reviewed to evaluate the vascular anatomy. RADIATION DOSE  REDUCTION: This exam was performed according to the departmental dose-optimization program which includes automated exposure control, adjustment of the mA and/or kV according to patient size and/or use of iterative reconstruction technique. CONTRAST:  OMNIPAQUE IOHEXOL 350 MG/ML SOLN COMPARISON:  PET-CT 07/06/2023. FINDINGS: VASCULAR Aorta: Normal caliber aorta without aneurysm, dissection, vasculitis or significant stenosis. There is mild calcified atherosclerotic disease. Celiac: Patent without evidence of aneurysm, dissection, vasculitis or significant stenosis. SMA: Patent without evidence of aneurysm, dissection, vasculitis or significant stenosis. Renals: Both renal arteries are patent without evidence of aneurysm, dissection, vasculitis, fibromuscular dysplasia or significant stenosis. IMA: Patent without evidence of aneurysm, dissection, vasculitis or significant stenosis. Inflow: Patent without evidence of aneurysm, dissection, vasculitis or significant stenosis. Proximal Outflow: Bilateral common femoral and visualized portions of the superficial and profunda femoral arteries are patent without evidence of aneurysm, dissection, vasculitis or significant stenosis. Veins: No  obvious venous abnormality within the limitations of this arterial phase study. Review of the MIP images confirms the above findings. NON-VASCULAR Lower chest: No acute abnormality. Hepatobiliary: Gallstones are present. There is no biliary ductal dilatation. No focal liver lesion identified. Pancreas: Unremarkable. No pancreatic ductal dilatation or surrounding inflammatory changes. Spleen: There are 2 rounded hypodense lesions in the spleen measuring up 2 13 mm which are indeterminate which have decreased in size and number compared to the prior study. Adrenals/Urinary Tract: Bilateral renal cysts are again noted measuring up to 1.9 cm on the left. There is no hydronephrosis or perinephric stranding. The adrenal glands and  bladder are within normal limits. Stomach/Bowel: There is wall thickening and inflammation involving a small bowel loop in the anterior mid right abdomen corresponding to patient's small bowel seen on the prior study. There is no evidence for bowel obstruction or free air. Colonic diverticula are again noted. The appendix and stomach are within normal limits. Lymphatic: Bulky central mesenteric lymphadenopathy has significantly decreased with the larger areas resolved. Enlarged right lower quadrant mesenteric lymph nodes persists measuring up to 2.1 x 1.5 cm, decreased in size. Right iliac chain mass again seen, decreased in size from prior now measuring 6.5 x 7.8 by 6.5 cm. This mass surrounds the inferior epigastric artery as seen on the prior study. There some central areas of hypodensity in this mass which may represent necrosis. Reproductive: Prostate radiotherapy seeds present. Other: No ascites.  No focal abdominal wall hernia. Musculoskeletal: No acute or significant osseous findings. IMPRESSION: 1. No evidence for aortic dissection or aneurysm. 2. No evidence for mesenteric ischemia. NON-VASCULAR 1. Wall thickening and inflammation of a small bowel loop in the anterior mid right abdomen worrisome for enteritis. 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 No bowel obstruction or free air. 2. Right iliac chain mass has decreased in size from prior. New central hypodensity in the mass may represent necrosis. Infection not excluded. 3. Mesenteric lymphadenopathy has significantly decreased. 4. Cholelithiasis. 5. Hypodense lesions in the spleen have decreased in size and number compared to the prior study. 6. Left Bosniak I benign renal cyst measuring 1.9 cm. No follow-up imaging is recommended. JACR 2018 Feb; 264-273, Management of the Incidental Renal Mass on CT, [employer] 2021; 814-848, Bosniak Classification of Cystic Renal Masses, Version 2019. Electronically Signed   By: Darliss Cheney M.D.   On: 08/17/2023 23:42    ASSESSMENT & PLAN Eugene Moreno is a 77 y.o.. male with medical history significant for diffuse large B-cell lymphoma who presents for a follow up visit.   #Diffuse Large B-cell Lymphoma, Stage III --Underwent US guided biopsy of pelvic mass on 06/24/2023.  Pathology is consistent with B-cell lymphoma with a high proliferation rate of 80-90%. Morphologically the findings are most consistent with a diffuse large B-cell lymphoma. FISH studies r/o Burkitt's lymphoma or double hit lymphoma. --Pre treatment PET CT scan on 07/06/2023 showed right external iliac nodal chain measuring 13.9 x 12.1 cm with an SUV max of 34.34. Multiple tracer avid lymph nodes are identified within the chest, abdomen and pelvis compatible with lymphoma. Consistent with Stage III disease.  PLAN: --Due for Cycle 4, Day 1 of R-CHOP chemotherapy. --Labs today show white blood cell 6.1, hemoglobin 1.7, MCV 96.3, and platelets of 177 --Proceed with treatment today without any dose modifications.  --interval PET CT on 09/09/2023 showed areas of concern including 2 new sites of tracer avid peritoneal soft tissue along the undersurface  of the right lower quadrant.  This measured Deauville criteria 5.  This was discussed with IR who noted the lesions were too small for biopsy and recommended continued observation.  There is also decreased intensity of the right external iliac nodes with the area measuring Deauville criteria for. --RTC in 3 weeks for labs, follow up before Cycle 5, Day 1.    #Rash: --Possibly related to antibiotic therapy --Patient is currently taking claritin daily. We will monitor his rash while he takes his prednisone over the next few days.   #Right groin pain: --Pain has improved and well controlled with Norco 5-325 mg BID as needed. Refill sent today.   #H/O DVT involving right  --Diagnosed 06/12/2022, secondary to underlying lymphoma --Currently on Eliquis 5 mg twice  daily  #Supportive Care -- chemotherapy education complete -- port placed -- zofran 8mg  q8H PRN and compazine 10mg  PO q6H for nausea -- allopurinol 300mg  PO daily for TLS prophylaxis -- EMLA cream for port -- no pain medication required at this time.    No orders of the defined types were placed in this encounter.   All questions were answered. The patient knows to call the clinic with any problems, questions or concerns.  I have spent a total of 30 minutes minutes of face-to-face and non-face-to-face time, preparing to see the patient,performing a medically appropriate examination, counseling and educating the patient, ordering tests/procedures, documenting clinical information in the electronic health record, independently interpreting results and communicating results to the patient, and care coordination.   Ulysees Barns, MD Department of Hematology/Oncology [facility] at [facility] Phone: [phone] Pager: [phone] Email: [email or web address]  09/16/2023 3:20 PM

## 2023-09-16 ENCOUNTER — Encounter: Payer: Self-pay | Admitting: Hematology and Oncology

## 2023-09-16 ENCOUNTER — Encounter (HOSPITAL_COMMUNITY): Payer: Medicare Other

## 2023-09-16 ENCOUNTER — Ambulatory Visit: Payer: Medicare Other | Admitting: Vascular Surgery

## 2023-09-17 ENCOUNTER — Inpatient Hospital Stay: Payer: Medicare Other

## 2023-09-17 VITALS — BP 121/75 | HR 86 | Temp 97.9°F | Resp 16

## 2023-09-17 DIAGNOSIS — Z5112 Encounter for antineoplastic immunotherapy: Secondary | ICD-10-CM | POA: Diagnosis not present

## 2023-09-17 DIAGNOSIS — C8338 Diffuse large B-cell lymphoma, lymph nodes of multiple sites: Secondary | ICD-10-CM

## 2023-09-17 MED ORDER — PEGFILGRASTIM-CBQV 6 MG/0.6ML ~~LOC~~ SOSY
6.0000 mg | PREFILLED_SYRINGE | Freq: Once | SUBCUTANEOUS | Status: AC
  Administered 2023-09-17: 6 mg via SUBCUTANEOUS

## 2023-09-21 ENCOUNTER — Other Ambulatory Visit: Payer: Self-pay

## 2023-10-06 MED FILL — Fosaprepitant Dimeglumine For IV Infusion 150 MG (Base Eq): INTRAVENOUS | Qty: 5 | Status: AC

## 2023-10-07 ENCOUNTER — Other Ambulatory Visit: Payer: Self-pay | Admitting: Hematology and Oncology

## 2023-10-07 ENCOUNTER — Inpatient Hospital Stay: Payer: Medicare Other | Attending: Physician Assistant

## 2023-10-07 ENCOUNTER — Inpatient Hospital Stay (HOSPITAL_BASED_OUTPATIENT_CLINIC_OR_DEPARTMENT_OTHER): Payer: Medicare Other | Admitting: Hematology and Oncology

## 2023-10-07 ENCOUNTER — Inpatient Hospital Stay: Payer: Medicare Other

## 2023-10-07 VITALS — BP 90/56 | HR 81 | Temp 98.1°F | Resp 13

## 2023-10-07 DIAGNOSIS — Z8546 Personal history of malignant neoplasm of prostate: Secondary | ICD-10-CM | POA: Insufficient documentation

## 2023-10-07 DIAGNOSIS — K5730 Diverticulosis of large intestine without perforation or abscess without bleeding: Secondary | ICD-10-CM | POA: Insufficient documentation

## 2023-10-07 DIAGNOSIS — I2510 Atherosclerotic heart disease of native coronary artery without angina pectoris: Secondary | ICD-10-CM | POA: Diagnosis not present

## 2023-10-07 DIAGNOSIS — K8020 Calculus of gallbladder without cholecystitis without obstruction: Secondary | ICD-10-CM | POA: Diagnosis not present

## 2023-10-07 DIAGNOSIS — Z7952 Long term (current) use of systemic steroids: Secondary | ICD-10-CM | POA: Diagnosis not present

## 2023-10-07 DIAGNOSIS — Z86718 Personal history of other venous thrombosis and embolism: Secondary | ICD-10-CM | POA: Diagnosis not present

## 2023-10-07 DIAGNOSIS — I252 Old myocardial infarction: Secondary | ICD-10-CM | POA: Insufficient documentation

## 2023-10-07 DIAGNOSIS — Z79633 Long term (current) use of mitotic inhibitor: Secondary | ICD-10-CM | POA: Diagnosis not present

## 2023-10-07 DIAGNOSIS — Z87891 Personal history of nicotine dependence: Secondary | ICD-10-CM | POA: Diagnosis not present

## 2023-10-07 DIAGNOSIS — I700 Atherosclerosis of aorta: Secondary | ICD-10-CM | POA: Diagnosis not present

## 2023-10-07 DIAGNOSIS — Z79899 Other long term (current) drug therapy: Secondary | ICD-10-CM | POA: Insufficient documentation

## 2023-10-07 DIAGNOSIS — R21 Rash and other nonspecific skin eruption: Secondary | ICD-10-CM | POA: Diagnosis not present

## 2023-10-07 DIAGNOSIS — C8338 Diffuse large B-cell lymphoma, lymph nodes of multiple sites: Secondary | ICD-10-CM

## 2023-10-07 DIAGNOSIS — Z8249 Family history of ischemic heart disease and other diseases of the circulatory system: Secondary | ICD-10-CM | POA: Diagnosis not present

## 2023-10-07 DIAGNOSIS — Z5112 Encounter for antineoplastic immunotherapy: Secondary | ICD-10-CM | POA: Insufficient documentation

## 2023-10-07 DIAGNOSIS — Z7901 Long term (current) use of anticoagulants: Secondary | ICD-10-CM | POA: Insufficient documentation

## 2023-10-07 DIAGNOSIS — Z79630 Long term (current) use of alkylating agent: Secondary | ICD-10-CM | POA: Insufficient documentation

## 2023-10-07 DIAGNOSIS — Z5111 Encounter for antineoplastic chemotherapy: Secondary | ICD-10-CM | POA: Diagnosis present

## 2023-10-07 DIAGNOSIS — Z79620 Long term (current) use of immunosuppressive biologic: Secondary | ICD-10-CM | POA: Insufficient documentation

## 2023-10-07 DIAGNOSIS — Z79632 Long term (current) use of antitumor antibiotic: Secondary | ICD-10-CM | POA: Diagnosis not present

## 2023-10-07 DIAGNOSIS — Z803 Family history of malignant neoplasm of breast: Secondary | ICD-10-CM | POA: Insufficient documentation

## 2023-10-07 LAB — CMP (CANCER CENTER ONLY)
ALT: 32 U/L (ref 0–44)
AST: 22 U/L (ref 15–41)
Albumin: 4.3 g/dL (ref 3.5–5.0)
Alkaline Phosphatase: 68 U/L (ref 38–126)
Anion gap: 7 (ref 5–15)
BUN: 13 mg/dL (ref 8–23)
CO2: 24 mmol/L (ref 22–32)
Calcium: 9.5 mg/dL (ref 8.9–10.3)
Chloride: 109 mmol/L (ref 98–111)
Creatinine: 0.76 mg/dL (ref 0.61–1.24)
GFR, Estimated: >60 mL/min (ref >60)
Glucose, Bld: 93 mg/dL (ref 70–99)
Potassium: 3.8 mmol/L (ref 3.5–5.1)
Sodium: 140 mmol/L (ref 135–145)
Total Bilirubin: 0.4 mg/dL (ref 0.3–1.2)
Total Protein: 7 g/dL (ref 6.5–8.1)

## 2023-10-07 LAB — CBC WITH DIFFERENTIAL (CANCER CENTER ONLY)
Abs Immature Granulocytes: 0.03 10*3/uL (ref 0.00–0.07)
Basophils Absolute: 0 10*3/uL (ref 0.0–0.1)
Basophils Relative: 1 %
Eosinophils Absolute: 0 10*3/uL (ref 0.0–0.5)
Eosinophils Relative: 1 %
HCT: 35.8 % — ABNORMAL LOW (ref 39.0–52.0)
Hemoglobin: 11.9 g/dL — ABNORMAL LOW (ref 13.0–17.0)
Immature Granulocytes: 1 %
Lymphocytes Relative: 5 %
Lymphs Abs: 0.2 10*3/uL — ABNORMAL LOW (ref 0.7–4.0)
MCH: 32.3 pg (ref 26.0–34.0)
MCHC: 33.2 g/dL (ref 30.0–36.0)
MCV: 97.3 fL (ref 80.0–100.0)
Monocytes Absolute: 0.4 10*3/uL (ref 0.1–1.0)
Monocytes Relative: 9 %
Neutro Abs: 3.5 10*3/uL (ref 1.7–7.7)
Neutrophils Relative %: 83 %
Platelet Count: 214 10*3/uL (ref 150–400)
RBC: 3.68 MIL/uL — ABNORMAL LOW (ref 4.22–5.81)
RDW: 17.7 % — ABNORMAL HIGH (ref 11.5–15.5)
WBC Count: 4.2 10*3/uL (ref 4.0–10.5)
nRBC: 0 % (ref 0.0–0.2)

## 2023-10-07 MED ORDER — PALONOSETRON HCL INJECTION 0.25 MG/5ML
0.2500 mg | Freq: Once | INTRAVENOUS | Status: AC
  Administered 2023-10-07: 0.25 mg via INTRAVENOUS
  Filled 2023-10-07: qty 5

## 2023-10-07 MED ORDER — HEPARIN SOD (PORK) LOCK FLUSH 100 UNIT/ML IV SOLN
500.0000 [IU] | Freq: Once | INTRAVENOUS | Status: AC | PRN
  Administered 2023-10-07: 500 [IU]

## 2023-10-07 MED ORDER — SODIUM CHLORIDE 0.9 % IV SOLN
375.0000 mg/m2 | Freq: Once | INTRAVENOUS | Status: AC
  Administered 2023-10-07: 800 mg via INTRAVENOUS
  Filled 2023-10-07: qty 30

## 2023-10-07 MED ORDER — DEXAMETHASONE SODIUM PHOSPHATE 10 MG/ML IJ SOLN
10.0000 mg | Freq: Once | INTRAMUSCULAR | Status: AC
  Administered 2023-10-07: 10 mg via INTRAVENOUS
  Filled 2023-10-07: qty 1

## 2023-10-07 MED ORDER — SODIUM CHLORIDE 0.9 % IV SOLN
150.0000 mg | Freq: Once | INTRAVENOUS | Status: AC
  Administered 2023-10-07: 150 mg via INTRAVENOUS
  Filled 2023-10-07: qty 150

## 2023-10-07 MED ORDER — SODIUM CHLORIDE 0.9 % IV SOLN
750.0000 mg/m2 | Freq: Once | INTRAVENOUS | Status: AC
  Administered 2023-10-07: 1500 mg via INTRAVENOUS
  Filled 2023-10-07: qty 75

## 2023-10-07 MED ORDER — VINCRISTINE SULFATE CHEMO INJECTION 1 MG/ML
2.0000 mg | Freq: Once | INTRAVENOUS | Status: AC
  Administered 2023-10-07: 2 mg via INTRAVENOUS
  Filled 2023-10-07: qty 2

## 2023-10-07 MED ORDER — ACETAMINOPHEN 325 MG PO TABS
650.0000 mg | ORAL_TABLET | Freq: Once | ORAL | Status: AC
  Administered 2023-10-07: 650 mg via ORAL
  Filled 2023-10-07: qty 2

## 2023-10-07 MED ORDER — SODIUM CHLORIDE 0.9% FLUSH
10.0000 mL | INTRAVENOUS | Status: DC | PRN
  Administered 2023-10-07: 10 mL

## 2023-10-07 MED ORDER — DIPHENHYDRAMINE HCL 25 MG PO CAPS
50.0000 mg | ORAL_CAPSULE | Freq: Once | ORAL | Status: AC
  Administered 2023-10-07: 50 mg via ORAL
  Filled 2023-10-07: qty 2

## 2023-10-07 MED ORDER — SODIUM CHLORIDE 0.9 % IV SOLN: Freq: Once | INTRAVENOUS | Status: AC

## 2023-10-07 MED ORDER — DOXORUBICIN HCL CHEMO IV INJECTION 2 MG/ML
50.0000 mg/m2 | Freq: Once | INTRAVENOUS | Status: AC
  Administered 2023-10-07: 106 mg via INTRAVENOUS
  Filled 2023-10-07: qty 53

## 2023-10-07 MED ORDER — HYDROCORTISONE 2 % EX CREA: 500.0000 mg | TOPICAL_CREAM | Freq: Two times a day (BID) | CUTANEOUS | 2 refills | Status: DC | PRN

## 2023-10-07 NOTE — Progress Notes (Unsigned)
[facility] [phone]   [phone]  PROGRESS NOTE  Patient Care Team: Orpha Bur, MD as PCP - General (Family Medicine) Quintella Reichert, MD as PCP - Cardiology (Cardiology) Crist Fat, MD as Attending Physician (Urology) Margaretmary Dys, MD as Consulting Physician (Radiation Oncology) Maryclare Labrador, RN as Registered Nurse Jaci Standard, MD as Consulting Physician (Hematology and Oncology)  Hematological/Oncological History # Diffuse Large B Cell Lymphoma, Stage III Presented to PCP with swelling and redness in his right leg x 3 weeks. 06/13/2023: Doppler US showed acute DVT involving right  common femoral vein, SF junction, right femoral vein, right proximal profunda vein, and external iliac vein. Incident finding included pelvic lesion with minimum vascularity anterior to the prostate, measuring 11.8 x 9.6 12.6 cm, with a volume of 749.3 cubic centimeters. Prior MR on 04/11/2021 reports 1.7 x .07 x 1.1 cm category 4 lesion. This lesion appears to be causing extrinsic compression on the external iliac vein (proximal external iliac vein not visualized due to extrinsic compression).  06/16/2023: CT angio abdomen/pelvis: Large lobulated mass in the right pelvis encasing the right iliac vessels and abutting the anterior pelvic wall as well as right iliacus muscle and right bladder dome. This mass consistent with a neoplastic process and features most suggestive of lymphoma.Multiple splenic lesions most consistent with metastatic disease.Short segment thick wall small bowel in the mid abdomen to the right of midline most consistent with infiltrative malignancy/lymphoma. Right pelvic sidewall mass/adenopathy.Cholelithiasis. Colonic diverticulosis. Aortic Atherosclerosis. 06/21/2023: Establ[facility] care [facility].  06/24/2023: US guided biopsy of abdominal mass. Pathology is consistent with B-cell lymphoma with a high proliferation rate of  80-90%. Morphologically the findings are most consistent with  a diffuse large B-cell lymphoma. FISH studies r/o Burkitt's lymphoma or double hit lymphoma. 07/15/2023: Cycle 1 Day 1 of R-CHOP chemotherapy.  08/05/2023: Cycle 2 Day 1 of R-CHOP chemotherapy.  08/26/2023: Cycle 3 Day 1 of R-CHOP chemotherapy.  09/15/2023: Cycle 4 Day 1 of R-CHOP chemotherapy.  10/07/2023: Cycle 5 Day 1 of R-CHOP chemotherapy.   Interval History:  Eugene Moreno Moreno 77 y.o. male with medical history significant for diffuse large B-cell lymphoma who presents for a follow up visit. The patient's last visit was on 09/15/2023. In the interim, he continues on R-CHOP chemotherapy. He presents today prior to Cycle 5, Day 1. He is accompanied by his wife for this visit.   On exam today Eugene Moreno Moreno reports he has been doing much better since he received the Cipro therapy at our last visit he reports the antibiotic therapy "did it sting".  He reports he completed successfully that regimen.  He continues to have the rash on his chest, face, and arms.  He reports it is not painful or itchy.  He notes he is using his over-the-counter hydrocortisone cream which is not improving it much.  He reports he would like something stronger with more potency.  He notes that he is tolerating Eliquis therapy well with no bleeding or bruising.  His energy levels about an 8 out of 10.  He reports that after therapy he does feel wiped out and sits and reads for a few days.  His appetite has been strong and he is sleeping well.  He denies fevers, chills, sweats, shortness of breath, chest pain, cough, headaches, dizziness or peripheral neuropathy. Overall he is willing and able to proceed with chemotherapy today.  A full 10 point ROS is otherwise negative.  MEDICAL  HISTORY:  Past Medical History:  Diagnosis Date   Coronary artery disease    cardiologist--- dr t. turner;  hx AWMI 09-06-2001  s/p cath with PCI and DES x1 to prox LAD;  last nuclear study  05-10-2016 in epic, anterior scar without ishcemia, nuclear ef 44%)   History of MI (myocardial infarction) 09/06/2001   anterior wall   Hyperlipemia    Hypertension    Leukopenia    chronic mild   Non Hodgkin's lymphoma (HCC)    Prostate cancer [facility])    urologist-- dr Marlou Porch--- dx 05/ 2022, Gleason 4+3, PSA 8.04   S/P drug eluting coronary stent placement 09/06/2001   x1 to prox LAD   Wears glasses    Wears hearing aid in both ears     SURGICAL HISTORY: Past Surgical History:  Procedure Laterality Date   ANTERIOR CERVICAL DECOMP/DISCECTOMY FUSION  01/26/2002   @[facility]  by dr Jeral Fruit;   C6-C7   COLONOSCOPY  2020   CORONARY ANGIOPLASTY WITH STENT PLACEMENT  09/06/2001   @[facility]  by dr s. 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   SPACE OAR INSTILLATION N/A 09/03/2021   Procedure: SPACE OAR INSTILLATION;  Surgeon: Crist Fat, MD;  Location: [facility];  Service: Urology;  Laterality: N/A;   WISDOM TOOTH EXTRACTION      SOCIAL HISTORY: Social History   Socioeconomic History   Marital status: Married    Spouse name: Not on file   Number of children: Not on file   Years of education: Not on file   Highest education level: Not on file  Occupational History   Occupation: retired    Associate Professor: [employer]    Comment: Systems analyst  Tobacco Use   Smoking status: Former    Current packs/day: 0.00    Types: Cigarettes    Start date: 1965    Quit date: 1969    Years since quitting: 55.8   Smokeless tobacco: Never  Vaping Use   Vaping status: Never Used  Substance and Sexual Activity   Alcohol use: Yes    Comment:  occasional   Drug use: Never   Sexual activity: Yes    Comment: vasectomy  Other Topics Concern   Not on file  Social History Narrative   Not on file   Social Determinants of Health   Financial Resource Strain: Not on file  Food Insecurity: No Food Insecurity (06/21/2023)   Hunger Vital Sign    Worried About Running Out of Food in the Last Year: Never true    Ran Out of Food in the Last Year: Never true  Transportation Needs: No Transportation Needs (06/21/2023)   PRAPARE - Administrator, Civil Service (Medical): No    Lack of Transportation (Non-Medical): No  Physical Activity: Not on file  Stress: Not on file  Social Connections: Not on file  Intimate Partner Violence: Not At Risk (06/21/2023)   Humiliation, Afraid, Rape, and Kick questionnaire    Fear of Current or Ex-Partner: No    Emotionally Abused: No    Physically Abused: No    Sexually Abused: No    FAMILY  HISTORY: Family History  Problem Relation Age of Onset   Breast cancer Mother 51   Heart attack Father        x3 between ages 18-64   Other Sister 61       angioplasty    ALLERGIES:  is allergic to niacin and related.  MEDICATIONS:  Current Outpatient Medications  Medication Sig Dispense Refill   allopurinol (ZYLOPRIM) 300 MG tablet Take 1 tablet (300 mg total) by mouth daily. 90 tablet 1   apixaban (ELIQUIS) 5 MG TABS tablet Take 1 tablet (5 mg total) by mouth 2 (two) times daily. Start taking after completion of starter pack. 60 tablet 5   aspirin EC 81 MG tablet Take 2 tablets (162 mg total) by mouth daily.     ciprofloxacin (CIPRO) 500 MG tablet Take 1 tablet (500 mg total) by mouth 2 (two) times daily. 28 tablet 0   Cod Liver Oil CAPS Take 2 capsules by mouth daily.     ferrous fumarate (HEMOCYTE - 106 MG FE) 325 (106 FE) MG TABS Take 1 tablet by mouth daily.     fluticasone (FLONASE) 50 MCG/ACT nasal spray Place 1 spray into both nostrils 2 (two) times daily.     folic acid (FOLVITE) 800 MCG  tablet Take 800 mcg by mouth daily.      HYDROcodone-acetaminophen (NORCO/VICODIN) 5-325 MG tablet Take 1 tablet by mouth every 6 (six) hours as needed. 60 tablet 0   hydrocortisone 2.5 % cream APPLY 500 MG TOPICALLY 2 (TWO) TIMES DAILY AS NEEDED. 28 g 2   icosapent Ethyl (VASCEPA) 1 g capsule Take 2 capsules (2 g total) by mouth 2 (two) times daily. 120 capsule 8   lidocaine-prilocaine (EMLA) cream Apply 1 Application topically as needed. 30 g 0   loratadine (CLARITIN) 10 MG tablet Take 10 mg by mouth daily.     ondansetron (ZOFRAN) 8 MG tablet Take 1 tablet (8 mg total) by mouth every 8 (eight) hours as needed. (Patient not taking: Reported on 08/26/2023) 30 tablet 0   predniSONE (DELTASONE) 20 MG tablet Take 3 tablets (60 mg total) by mouth daily with breakfast. 15 tablet 5   prochlorperazine (COMPAZINE) 10 MG tablet Take 1 tablet (10 mg total) by mouth every 6 (six) hours as needed for nausea or vomiting. (Patient not taking: Reported on 08/26/2023) 30 tablet 0   ramipril (ALTACE) 2.5 MG capsule TAKE 1 CAPSULE BY MOUTH EVERY DAY 90 capsule 3   rosuvastatin (CRESTOR) 10 MG tablet TAKE 1 TABLET BY MOUTH EVERY DAY 90 tablet 3   vitamin C (ASCORBIC ACID) 500 MG tablet Take 1,000 mg by mouth daily.      vitamin E 400 UNIT capsule Take 400 Units by mouth daily.     No current facility-administered medications for this visit.    REVIEW OF SYSTEMS:   Constitutional: ( - ) fevers, ( - )  chills , ( - ) night sweats Eyes: ( - ) blurriness of vision, ( - ) double vision, ( - ) watery eyes Ears, nose, mouth, throat, and face: ( - ) mucositis, ( - ) sore throat Respiratory: ( - ) cough, ( - ) dyspnea, ( - ) wheezes Cardiovascular: ( - ) palpitation, ( - ) chest discomfort, ( - ) lower extremity swelling Gastrointestinal:  ( - ) nausea, ( - ) heartburn, ( - ) change in bowel habits Skin: ( - ) abnormal skin rashes Lymphatics: ( - ) new lymphadenopathy, ( - ) easy  bruising Neurological: ( - ) numbness, (  - ) tingling, ( - ) new weaknesses Behavioral/Psych: ( - ) mood change, ( - ) new changes  All other systems were reviewed with the patient and are negative.  PHYSICAL EXAMINATION: ECOG PERFORMANCE STATUS: 1 - Symptomatic but completely ambulatory  Vitals:   10/07/23 0957  BP: 104/77  Pulse: 80  Resp: 16  Temp: (!) 97.5 F (36.4 C)  SpO2: 97%    Filed Weights   10/07/23 0957  Weight: 190 lb 8 oz (86.4 kg)     GENERAL: Well-appearing elderly Caucasian male, alert, no distress and comfortable SKIN: skin color, texture, turgor are normal, significant lesions. Mild erythematous rash on chest and forehead, chest, and arms EYES: conjunctiva are pink and non-injected, sclera clear LUNGS: clear to auscultation and percussion with normal breathing effort HEART: regular rate & rhythm and no murmurs and no lower extremity edema Musculoskeletal: no cyanosis of digits and no clubbing  PSYCH: alert & oriented x 3, fluent speech NEURO: no focal motor/sensory deficits  LABORATORY DATA:  I have reviewed the data as listed    Latest Ref Rng & Units 10/07/2023    9:20 AM 09/15/2023    9:05 AM 08/26/2023   10:01 AM  CBC  WBC 4.0 - 10.5 K/uL 4.2  6.1  5.9   Hemoglobin 13.0 - 17.0 g/dL 16.1  09.6  04.5   Hematocrit 39.0 - 52.0 % 35.8  33.9  34.7   Platelets 150 - 400 K/uL 214  177  350        Latest Ref Rng & Units 10/07/2023    9:20 AM 09/15/2023    9:05 AM 08/26/2023   10:01 AM  CMP  Glucose 70 - 99 mg/dL 93  409  811   BUN 8 - 23 mg/dL 13  20  17    Creatinine 0.61 - 1.24 mg/dL 9.14  7.82  9.56   Sodium 135 - 145 mmol/L 140  137  138   Potassium 3.5 - 5.1 mmol/L 3.8  3.9  3.9   Chloride 98 - 111 mmol/L 109  105  106   CO2 22 - 32 mmol/L 24  25  24    Calcium 8.9 - 10.3 mg/dL 9.5  8.9  9.0   Total Protein 6.5 - 8.1 g/dL 7.0  7.0  7.0   Total Bilirubin 0.3 - 1.2 mg/dL 0.4  0.4  0.3   Alkaline Phos 38 - 126 U/L 68  70  57   AST 15 - 41 U/L 22  20  20    ALT 0 - 44 U/L 32  22  18      Lab Results  Component Value Date   MPROTEIN Not Observed 11/05/2016   Lab Results  Component Value Date   KPAFRELGTCHN 2.46 (H) 11/03/2015   KPAFRELGTCHN 2.86 (H) 09/16/2011   KPAFRELGTCHN 2.86 (H) 09/16/2011   LAMBDASER 1.71 11/03/2015   LAMBDASER 2.65 (H) 09/16/2011   LAMBDASER 2.65 (H) 09/16/2011   KAPLAMBRATIO 1.25 11/05/2016   KAPLAMBRATIO 1.44 11/03/2015   KAPLAMBRATIO 1.08 09/16/2011   KAPLAMBRATIO 1.08 09/16/2011    RADIOGRAPHIC STUDIES: No results found.  ASSESSMENT & PLAN JUA KIKER is a 77 y.o.. male with medical history significant for diffuse large B-cell lymphoma who presents for a follow up visit.   #Diffuse Large B-cell Lymphoma, Stage III --Underwent US guided biopsy of pelvic mass on 06/24/2023.  Pathology is consistent with B-cell lymphoma with a high proliferation rate of 80-90%.  Morphologically the findings are most consistent with a diffuse large B-cell lymphoma. FISH studies r/o Burkitt's lymphoma or double hit lymphoma. --Pre treatment PET CT scan on 07/06/2023 showed right external iliac nodal chain measuring 13.9 x 12.1 cm with an SUV max of 34.34. Multiple tracer avid lymph nodes are identified within the chest, abdomen and pelvis compatible with lymphoma. Consistent with Stage III disease.  PLAN: --Due for Cycle 5, Day 1 of R-CHOP chemotherapy. --Labs today show white blood cell 4.2, hemoglobin 0.9, MCV 97.3, and platelets of 214 --Proceed with treatment today without any dose modifications.  --interval PET CT on 09/09/2023 showed areas of concern including 2 new sites of tracer avid peritoneal soft tissue along the undersurface of the right lower quadrant.  This measured Deauville criteria 5.  This was discussed with IR who noted the lesions were too small for biopsy and recommended continued observation.  There is also decreased intensity of the right external iliac nodes with the area measuring Deauville criteria 4 --RTC in 3 weeks for labs,  follow up before Cycle 6, Day 1.    #Rash: --Possibly related to antibiotic therapy --Patient is currently taking claritin daily. We will monitor his rash while he takes his prednisone over the next few days.  -- Prescribed hydrocortisone 2.5% cream sent to his pharmacy.  #Right groin pain: --Pain has improved and well controlled with Norco 5-325 mg BID as needed.  #H/O DVT involving right  --Diagnosed 06/12/2022, secondary to underlying lymphoma --Currently on Eliquis 5 mg twice daily  #Supportive Care -- chemotherapy education complete -- port placed -- zofran 8mg  q8H PRN and compazine 10mg  PO q6H for nausea -- allopurinol 300mg  PO daily for TLS prophylaxis -- EMLA cream for port -- no pain medication required at this time.    No orders of the defined types were placed in this encounter.   All questions were answered. The patient knows to call the clinic with any problems, questions or concerns.  I have spent a total of 30 minutes minutes of face-to-face and non-face-to-face time, preparing to see the patient,performing a medically appropriate examination, counseling and educating the patient, ordering tests/procedures, documenting clinical information in the electronic health record, independently interpreting results and communicating results to the patient, and care coordination.   Ulysees Barns, MD Department of Hematology/Oncology [facility] Phone: [phone] Pager: [phone] Email: [email or web address]  10/09/2023 2:31 PM

## 2023-10-07 NOTE — Patient Instructions (Signed)
[place] [facility]  Discharge Instructions: Thank you for choosing [facility] to provide your oncology and hematology care.   If you have a lab appointment with the Cancer Center, please go directly to the Cancer Center and check in at the registration area.   Wear comfortable clothing and clothing appropriate for easy access to any Portacath or PICC line.   We strive to give you quality time with your provider. You may need to reschedule your appointment if you arrive late (15 or more minutes).  Arriving late affects you and other patients whose appointments are after yours.  Also, if you miss three or more appointments without notifying the office, you may be dismissed from the clinic at the provider's discretion.      For prescription refill requests, have your pharmacy contact our office and allow 72 hours for refills to be completed.    Today you received the following chemotherapy and/or immunotherapy agents : Adriamycin, Vincristine, Cytoxan, Rituxan      To help prevent nausea and vomiting after your treatment, we encourage you to take your nausea medication as directed.  BELOW ARE SYMPTOMS THAT SHOULD BE REPORTED IMMEDIATELY: *FEVER GREATER THAN 100.4 F (38 C) OR HIGHER *CHILLS OR SWEATING *NAUSEA AND VOMITING THAT IS NOT CONTROLLED WITH YOUR NAUSEA MEDICATION *UNUSUAL SHORTNESS OF BREATH *UNUSUAL BRUISING OR BLEEDING *URINARY PROBLEMS (pain or burning when urinating, or frequent urination) *BOWEL PROBLEMS (unusual diarrhea, constipation, pain near the anus) TENDERNESS IN MOUTH AND THROAT WITH OR WITHOUT PRESENCE OF ULCERS (sore throat, sores in mouth, or a toothache) UNUSUAL RASH, SWELLING OR PAIN  UNUSUAL VAGINAL DISCHARGE OR ITCHING   Items with * indicate a potential emergency and should be followed up as soon as possible or go to the Emergency Department if any problems should occur.  Please show the CHEMOTHERAPY ALERT CARD or  IMMUNOTHERAPY ALERT CARD at check-in to the Emergency Department and triage nurse.  Should you have questions after your visit or need to cancel or reschedule your appointment, please contact [facility]  Dept: [phone]  and follow the prompts.  Office hours are 8:00 a.m. to 4:30 p.m. Monday - Friday. Please note that voicemails left after 4:00 p.m. may not be returned until the following business day.  We are closed weekends and major holidays. You have access to a nurse at all times for urgent questions. Please call the main number to the clinic Dept: [phone] and follow the prompts.   For any non-urgent questions, you may also contact your provider using MyChart. We now offer e-Visits for anyone 18 and older to request care online for non-urgent symptoms. For details visit [email or web address].   Also download the MyChart app! Go to the app store, search "MyChart", open the app, select [facility], and log in with your MyChart username and password.   

## 2023-10-09 ENCOUNTER — Encounter: Payer: Self-pay | Admitting: Hematology and Oncology

## 2023-10-10 ENCOUNTER — Inpatient Hospital Stay: Payer: Medicare Other

## 2023-10-10 VITALS — BP 102/64 | HR 62 | Temp 98.1°F | Resp 16

## 2023-10-10 DIAGNOSIS — C8338 Diffuse large B-cell lymphoma, lymph nodes of multiple sites: Secondary | ICD-10-CM

## 2023-10-10 DIAGNOSIS — Z5112 Encounter for antineoplastic immunotherapy: Secondary | ICD-10-CM | POA: Diagnosis not present

## 2023-10-10 MED ORDER — PEGFILGRASTIM INJECTION 6 MG/0.6ML ~~LOC~~
6.0000 mg | PREFILLED_SYRINGE | Freq: Once | SUBCUTANEOUS | Status: AC
  Administered 2023-10-10: 6 mg via SUBCUTANEOUS
  Filled 2023-10-10: qty 0.6

## 2023-10-10 MED ORDER — PEGFILGRASTIM-CBQV 6 MG/0.6ML ~~LOC~~ SOSY
6.0000 mg | PREFILLED_SYRINGE | Freq: Once | SUBCUTANEOUS | Status: DC
Start: 2023-10-10 — End: 2023-10-10

## 2023-10-10 NOTE — Patient Instructions (Signed)

## 2023-10-27 ENCOUNTER — Encounter: Payer: Self-pay | Admitting: Hematology and Oncology

## 2023-10-27 MED FILL — Fosaprepitant Dimeglumine For IV Infusion 150 MG (Base Eq): INTRAVENOUS | Qty: 5 | Status: AC

## 2023-10-28 ENCOUNTER — Inpatient Hospital Stay: Payer: Medicare Other | Attending: Physician Assistant

## 2023-10-28 ENCOUNTER — Inpatient Hospital Stay: Payer: Medicare Other

## 2023-10-28 ENCOUNTER — Inpatient Hospital Stay (HOSPITAL_BASED_OUTPATIENT_CLINIC_OR_DEPARTMENT_OTHER): Payer: Medicare Other | Admitting: Hematology and Oncology

## 2023-10-28 VITALS — BP 102/66 | HR 79 | Temp 97.7°F | Resp 18

## 2023-10-28 VITALS — BP 110/74 | HR 82 | Temp 97.5°F | Resp 16 | Wt 195.6 lb

## 2023-10-28 DIAGNOSIS — R21 Rash and other nonspecific skin eruption: Secondary | ICD-10-CM | POA: Insufficient documentation

## 2023-10-28 DIAGNOSIS — Z5111 Encounter for antineoplastic chemotherapy: Secondary | ICD-10-CM | POA: Diagnosis present

## 2023-10-28 DIAGNOSIS — K5730 Diverticulosis of large intestine without perforation or abscess without bleeding: Secondary | ICD-10-CM | POA: Insufficient documentation

## 2023-10-28 DIAGNOSIS — C8338 Diffuse large B-cell lymphoma, lymph nodes of multiple sites: Secondary | ICD-10-CM

## 2023-10-28 DIAGNOSIS — Z79632 Long term (current) use of antitumor antibiotic: Secondary | ICD-10-CM | POA: Insufficient documentation

## 2023-10-28 DIAGNOSIS — Z79899 Other long term (current) drug therapy: Secondary | ICD-10-CM | POA: Insufficient documentation

## 2023-10-28 DIAGNOSIS — Z86718 Personal history of other venous thrombosis and embolism: Secondary | ICD-10-CM | POA: Insufficient documentation

## 2023-10-28 DIAGNOSIS — I2510 Atherosclerotic heart disease of native coronary artery without angina pectoris: Secondary | ICD-10-CM | POA: Insufficient documentation

## 2023-10-28 DIAGNOSIS — Z8249 Family history of ischemic heart disease and other diseases of the circulatory system: Secondary | ICD-10-CM | POA: Diagnosis not present

## 2023-10-28 DIAGNOSIS — Z79620 Long term (current) use of immunosuppressive biologic: Secondary | ICD-10-CM | POA: Diagnosis not present

## 2023-10-28 DIAGNOSIS — Z79633 Long term (current) use of mitotic inhibitor: Secondary | ICD-10-CM | POA: Insufficient documentation

## 2023-10-28 DIAGNOSIS — Z95828 Presence of other vascular implants and grafts: Secondary | ICD-10-CM

## 2023-10-28 DIAGNOSIS — Z7901 Long term (current) use of anticoagulants: Secondary | ICD-10-CM | POA: Diagnosis not present

## 2023-10-28 DIAGNOSIS — K8020 Calculus of gallbladder without cholecystitis without obstruction: Secondary | ICD-10-CM | POA: Insufficient documentation

## 2023-10-28 DIAGNOSIS — Z79630 Long term (current) use of alkylating agent: Secondary | ICD-10-CM | POA: Insufficient documentation

## 2023-10-28 DIAGNOSIS — I252 Old myocardial infarction: Secondary | ICD-10-CM | POA: Insufficient documentation

## 2023-10-28 DIAGNOSIS — Z8546 Personal history of malignant neoplasm of prostate: Secondary | ICD-10-CM | POA: Insufficient documentation

## 2023-10-28 DIAGNOSIS — Z5112 Encounter for antineoplastic immunotherapy: Secondary | ICD-10-CM | POA: Insufficient documentation

## 2023-10-28 DIAGNOSIS — I700 Atherosclerosis of aorta: Secondary | ICD-10-CM | POA: Diagnosis not present

## 2023-10-28 DIAGNOSIS — Z803 Family history of malignant neoplasm of breast: Secondary | ICD-10-CM | POA: Diagnosis not present

## 2023-10-28 DIAGNOSIS — Z87891 Personal history of nicotine dependence: Secondary | ICD-10-CM | POA: Insufficient documentation

## 2023-10-28 DIAGNOSIS — Z7952 Long term (current) use of systemic steroids: Secondary | ICD-10-CM | POA: Insufficient documentation

## 2023-10-28 LAB — CMP (CANCER CENTER ONLY)
ALT: 25 U/L (ref 0–44)
AST: 20 U/L (ref 15–41)
Albumin: 4.1 g/dL (ref 3.5–5.0)
Alkaline Phosphatase: 61 U/L (ref 38–126)
Anion gap: 5 (ref 5–15)
BUN: 15 mg/dL (ref 8–23)
CO2: 25 mmol/L (ref 22–32)
Calcium: 8.8 mg/dL — ABNORMAL LOW (ref 8.9–10.3)
Chloride: 109 mmol/L (ref 98–111)
Creatinine: 0.71 mg/dL (ref 0.61–1.24)
GFR, Estimated: >60 mL/min (ref >60)
Glucose, Bld: 109 mg/dL — ABNORMAL HIGH (ref 70–99)
Potassium: 3.9 mmol/L (ref 3.5–5.1)
Sodium: 139 mmol/L (ref 135–145)
Total Bilirubin: 0.3 mg/dL (ref <1.2)
Total Protein: 6.6 g/dL (ref 6.5–8.1)

## 2023-10-28 LAB — CBC WITH DIFFERENTIAL (CANCER CENTER ONLY)
Abs Immature Granulocytes: 0.03 10*3/uL (ref 0.00–0.07)
Basophils Absolute: 0 10*3/uL (ref 0.0–0.1)
Basophils Relative: 1 %
Eosinophils Absolute: 0 10*3/uL (ref 0.0–0.5)
Eosinophils Relative: 1 %
HCT: 33.6 % — ABNORMAL LOW (ref 39.0–52.0)
Hemoglobin: 11.3 g/dL — ABNORMAL LOW (ref 13.0–17.0)
Immature Granulocytes: 1 %
Lymphocytes Relative: 3 %
Lymphs Abs: 0.2 10*3/uL — ABNORMAL LOW (ref 0.7–4.0)
MCH: 33.4 pg (ref 26.0–34.0)
MCHC: 33.6 g/dL (ref 30.0–36.0)
MCV: 99.4 fL (ref 80.0–100.0)
Monocytes Absolute: 0.3 10*3/uL (ref 0.1–1.0)
Monocytes Relative: 4 %
Neutro Abs: 5.8 10*3/uL (ref 1.7–7.7)
Neutrophils Relative %: 90 %
Platelet Count: 212 10*3/uL (ref 150–400)
RBC: 3.38 MIL/uL — ABNORMAL LOW (ref 4.22–5.81)
RDW: 16.7 % — ABNORMAL HIGH (ref 11.5–15.5)
WBC Count: 6.3 10*3/uL (ref 4.0–10.5)
nRBC: 0 % (ref 0.0–0.2)

## 2023-10-28 MED ORDER — ACETAMINOPHEN 325 MG PO TABS
650.0000 mg | ORAL_TABLET | Freq: Once | ORAL | Status: AC
  Administered 2023-10-28: 650 mg via ORAL
  Filled 2023-10-28: qty 2

## 2023-10-28 MED ORDER — DEXAMETHASONE SODIUM PHOSPHATE 10 MG/ML IJ SOLN
10.0000 mg | Freq: Once | INTRAMUSCULAR | Status: AC
  Administered 2023-10-28: 10 mg via INTRAVENOUS
  Filled 2023-10-28: qty 1

## 2023-10-28 MED ORDER — DIPHENHYDRAMINE HCL 25 MG PO CAPS
50.0000 mg | ORAL_CAPSULE | Freq: Once | ORAL | Status: AC
  Administered 2023-10-28: 50 mg via ORAL
  Filled 2023-10-28: qty 2

## 2023-10-28 MED ORDER — SODIUM CHLORIDE 0.9 % IV SOLN
750.0000 mg/m2 | Freq: Once | INTRAVENOUS | Status: AC
  Administered 2023-10-28: 1500 mg via INTRAVENOUS
  Filled 2023-10-28: qty 75

## 2023-10-28 MED ORDER — SODIUM CHLORIDE 0.9 % IV SOLN
375.0000 mg/m2 | Freq: Once | INTRAVENOUS | Status: AC
  Administered 2023-10-28: 800 mg via INTRAVENOUS
  Filled 2023-10-28: qty 50

## 2023-10-28 MED ORDER — SODIUM CHLORIDE 0.9 % IV SOLN
150.0000 mg | Freq: Once | INTRAVENOUS | Status: AC
  Administered 2023-10-28: 150 mg via INTRAVENOUS
  Filled 2023-10-28: qty 150

## 2023-10-28 MED ORDER — PALONOSETRON HCL INJECTION 0.25 MG/5ML
0.2500 mg | Freq: Once | INTRAVENOUS | Status: AC
  Administered 2023-10-28: 0.25 mg via INTRAVENOUS
  Filled 2023-10-28: qty 5

## 2023-10-28 MED ORDER — DOXORUBICIN HCL CHEMO IV INJECTION 2 MG/ML
50.0000 mg/m2 | Freq: Once | INTRAVENOUS | Status: AC
  Administered 2023-10-28: 106 mg via INTRAVENOUS
  Filled 2023-10-28: qty 53

## 2023-10-28 MED ORDER — SODIUM CHLORIDE 0.9% FLUSH
10.0000 mL | INTRAVENOUS | Status: DC | PRN
  Administered 2023-10-28: 10 mL

## 2023-10-28 MED ORDER — VINCRISTINE SULFATE CHEMO INJECTION 1 MG/ML
2.0000 mg | Freq: Once | INTRAVENOUS | Status: AC
  Administered 2023-10-28: 2 mg via INTRAVENOUS
  Filled 2023-10-28: qty 2

## 2023-10-28 MED ORDER — HEPARIN SOD (PORK) LOCK FLUSH 100 UNIT/ML IV SOLN
500.0000 [IU] | Freq: Once | INTRAVENOUS | Status: AC | PRN
  Administered 2023-10-28: 500 [IU]

## 2023-10-28 MED ORDER — SODIUM CHLORIDE 0.9 % IV SOLN: Freq: Once | INTRAVENOUS | Status: AC

## 2023-10-28 NOTE — Patient Instructions (Signed)
[place] [facility] - A DEPT OF [facility]  Discharge Instructions: Thank you for choosing [facility] to provide your oncology and hematology care.   If you have a lab appointment with the Cancer Center, please go directly to the Cancer Center and check in at the registration area.   Wear comfortable clothing and clothing appropriate for easy access to any Portacath or PICC line.   We strive to give you quality time with your provider. You may need to reschedule your appointment if you arrive late (15 or more minutes).  Arriving late affects you and other patients whose appointments are after yours.  Also, if you miss three or more appointments without notifying the office, you may be dismissed from the clinic at the provider's discretion.      For prescription refill requests, have your pharmacy contact our office and allow 72 hours for refills to be completed.    Today you received the following chemotherapy and/or immunotherapy agents rituxan, vincristine, adriamycin, cytoxan      To help prevent nausea and vomiting after your treatment, we encourage you to take your nausea medication as directed.  BELOW ARE SYMPTOMS THAT SHOULD BE REPORTED IMMEDIATELY: *FEVER GREATER THAN 100.4 F (38 C) OR HIGHER *CHILLS OR SWEATING *NAUSEA AND VOMITING THAT IS NOT CONTROLLED WITH YOUR NAUSEA MEDICATION *UNUSUAL SHORTNESS OF BREATH *UNUSUAL BRUISING OR BLEEDING *URINARY PROBLEMS (pain or burning when urinating, or frequent urination) *BOWEL PROBLEMS (unusual diarrhea, constipation, pain near the anus) TENDERNESS IN MOUTH AND THROAT WITH OR WITHOUT PRESENCE OF ULCERS (sore throat, sores in mouth, or a toothache) UNUSUAL RASH, SWELLING OR PAIN  UNUSUAL VAGINAL DISCHARGE OR ITCHING   Items with * indicate a potential emergency and should be followed up as soon as possible or go to the Emergency Department if any problems should occur.  Please show the  CHEMOTHERAPY ALERT CARD or IMMUNOTHERAPY ALERT CARD at check-in to the Emergency Department and triage nurse.  Should you have questions after your visit or need to cancel or reschedule your appointment, please contact [facility] - A DEPT OF [facility]  Dept: [phone]  and follow the prompts.  Office hours are 8:00 a.m. to 4:30 p.m. Monday - Friday. Please note that voicemails left after 4:00 p.m. may not be returned until the following business day.  We are closed weekends and major holidays. You have access to a nurse at all times for urgent questions. Please call the main number to the clinic Dept: [phone] and follow the prompts.   For any non-urgent questions, you may also contact your provider using MyChart. We now offer e-Visits for anyone 63 and older to request care online for non-urgent symptoms. For details visit [email or web address].   Also download the MyChart app! Go to the app store, search "MyChart", open the app, select [facility], and log in with your MyChart username and password.

## 2023-10-28 NOTE — Progress Notes (Signed)
[facility] [phone]   [phone]  PROGRESS NOTE  Patient Care Team: Orpha Bur, MD as PCP - General (Family Medicine) Quintella Reichert, MD as PCP - Cardiology (Cardiology) Crist Fat, MD as Attending Physician (Urology) Margaretmary Dys, MD as Consulting Physician (Radiation Oncology) Maryclare Labrador, RN as Registered Nurse Jaci Standard, MD as Consulting Physician (Hematology and Oncology)  Hematological/Oncological History # Diffuse Large B Cell Lymphoma, Stage III Presented to PCP with swelling and redness in his right leg x 3 weeks. 06/13/2023: Doppler US showed acute DVT involving right  common femoral vein, SF junction, right femoral vein, right proximal profunda vein, and external iliac vein. Incident finding included pelvic lesion with minimum vascularity anterior to the prostate, measuring 11.8 x 9.6 12.6 cm, with a volume of 749.3 cubic centimeters. Prior MR on 04/11/2021 reports 1.7 x .07 x 1.1 cm category 4 lesion. This lesion appears to be causing extrinsic compression on the external iliac vein (proximal external iliac vein not visualized due to extrinsic compression).  06/16/2023: CT angio abdomen/pelvis: Large lobulated mass in the right pelvis encasing the right iliac vessels and abutting the anterior pelvic wall as well as right iliacus muscle and right bladder dome. This mass consistent with a neoplastic process and features most suggestive of lymphoma.Multiple splenic lesions most consistent with metastatic disease.Short segment thick wall small bowel in the mid abdomen to the right of midline most consistent with infiltrative malignancy/lymphoma. Right pelvic sidewall mass/adenopathy.Cholelithiasis. Colonic diverticulosis. Aortic Atherosclerosis. 06/21/2023: Establish care with [facility].  06/24/2023: US guided biopsy of abdominal mass. Pathology is consistent with B-cell lymphoma with a high proliferation rate of  80-90%. Morphologically the findings are most consistent with  a diffuse large B-cell lymphoma. FISH studies r/o Burkitt's lymphoma or double hit lymphoma. 07/15/2023: Cycle 1 Day 1 of R-CHOP chemotherapy.  08/05/2023: Cycle 2 Day 1 of R-CHOP chemotherapy.  08/26/2023: Cycle 3 Day 1 of R-CHOP chemotherapy.  09/15/2023: Cycle 4 Day 1 of R-CHOP chemotherapy.  10/07/2023: Cycle 5 Day 1 of R-CHOP chemotherapy.  10/28/2023: Cycle 6 Day 1 of R-CHOP chemotherapy.   Interval History:  Eugene Moreno 77 y.o. male with medical history significant for diffuse large B-cell lymphoma who presents for a follow up visit. The patient's last visit was on 10/28/2023. In the interim, he continues on R-CHOP chemotherapy. He presents today prior to Cycle 6, Day 1. He is accompanied by his wife for this visit.   On exam today Eugene Moreno reports he has been well overall in the interim since her last visit.  He reports he is taking his last pill of his current prescription of Eliquis today and would like to refill it, but notes he is in the donut hole in the next refill be more expensive.  He reports he tolerated cycle 5 of chemotherapy quite well with no nausea, vomiting, or diarrhea.  He continues to have a rash on his face, chest, and arms.  He reports the hydrocortisone is helping and the rash is not itchy or painful.  He notes he is eating well and his energy is strong.  He also reports that the pain in his energy is gone ever since he completed the antibiotic therapy.  He reports that he has not had any infectious symptoms such as runny nose, sore throat, or cough.  He denies fevers, chills, sweats, shortness of breath, chest pain, cough, headaches, dizziness or peripheral neuropathy. Overall he is willing and  able to proceed with chemotherapy today.  A full 10 point ROS is otherwise negative.  MEDICAL HISTORY:  Past Medical History:  Diagnosis Date   Coronary artery disease    cardiologist--- dr t. turner;  hx AWMI  09-06-2001  s/p cath with PCI and DES x1 to prox LAD;  last nuclear study 05-10-2016 in epic, anterior scar without ishcemia, nuclear ef 44%)   History of MI (myocardial infarction) 09/06/2001   anterior wall   Hyperlipemia    Hypertension    Leukopenia    chronic mild   Non Hodgkin's lymphoma (HCC)    Prostate cancer [facility])    urologist-- dr Marlou Porch--- dx 05/ 2022, Gleason 4+3, PSA 8.04   S/P drug eluting coronary stent placement 09/06/2001   x1 to prox LAD   Wears glasses    Wears hearing aid in both ears     SURGICAL HISTORY: Past Surgical History:  Procedure Laterality Date   ANTERIOR CERVICAL DECOMP/DISCECTOMY FUSION  01/26/2002   @[facility]  by dr Jeral Fruit;   C6-C7   COLONOSCOPY  2020   CORONARY ANGIOPLASTY WITH STENT PLACEMENT  09/06/2001   @[facility]  by dr s. Maylon Cos;   PCI and DES x1 to prox LAD   IR IMAGING GUIDED PORT INSERTION  07/11/2023   RADIOACTIVE SEED IMPLANT N/A 09/03/2021   Procedure: RADIOACTIVE SEED IMPLANT/BRACHYTHERAPY IMPLANT;  Surgeon: Crist Fat, MD;  Location: 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   SPACE OAR INSTILLATION N/A 09/03/2021   Procedure: SPACE OAR INSTILLATION;  Surgeon: Crist Fat, MD;  Location: [facility];  Service: Urology;  Laterality: N/A;   WISDOM TOOTH EXTRACTION      SOCIAL HISTORY: Social History   Socioeconomic History   Marital status: Married    Spouse name: Not on file   Number of children: Not on file   Years of education: Not on file   Highest education level: Not on file  Occupational History   Occupation: retired    Associate Professor: [employer]    Comment: Systems analyst  Tobacco Use   Smoking status: Former    Current packs/day: 0.00    Types: Cigarettes    Start date: 1965    Quit date: 1969    Years since quitting: 55.8   Smokeless tobacco: Never  Vaping Use   Vaping status:  Never Used  Substance and Sexual Activity   Alcohol use: Yes    Comment: occasional   Drug use: Never   Sexual activity: Yes    Comment: vasectomy  Other Topics Concern   Not on file  Social History Narrative   Not on file   Social Determinants of Health   Financial Resource Strain: Not on file  Food Insecurity: No Food Insecurity (06/21/2023)   Hunger Vital Sign    Worried About Running Out of Food in the Last Year: Never true    Ran Out of Food in the Last Year: Never true  Transportation Needs: No Transportation Needs (06/21/2023)   PRAPARE - Administrator, Civil Service (Medical): No    Lack of Transportation (Non-Medical): No  Physical Activity: Not on file  Stress: Not on file  Social Connections: Not on file  Intimate Partner Violence: Not At Risk (06/21/2023)   Humiliation, Afraid, Rape, and Kick questionnaire    Fear of Current or Ex-Partner: No    Emotionally Abused:  No    Physically Abused: No    Sexually Abused: No    FAMILY HISTORY: Family History  Problem Relation Age of Onset   Breast cancer Mother 67   Heart attack Father        x3 between ages 29-64   Other Sister 22       angioplasty    ALLERGIES:  is allergic to niacin and related.  MEDICATIONS:  Current Outpatient Medications  Medication Sig Dispense Refill   allopurinol (ZYLOPRIM) 300 MG tablet Take 1 tablet (300 mg total) by mouth daily. 90 tablet 1   apixaban (ELIQUIS) 5 MG TABS tablet Take 1 tablet (5 mg total) by mouth 2 (two) times daily. Start taking after completion of starter pack. 60 tablet 5   aspirin EC 81 MG tablet Take 2 tablets (162 mg total) by mouth daily.     ciprofloxacin (CIPRO) 500 MG tablet Take 1 tablet (500 mg total) by mouth 2 (two) times daily. 28 tablet 0   Cod Liver Oil CAPS Take 2 capsules by mouth daily.     ferrous fumarate (HEMOCYTE - 106 MG FE) 325 (106 FE) MG TABS Take 1 tablet by mouth daily.     fluticasone (FLONASE) 50 MCG/ACT nasal spray Place 1  spray into both nostrils 2 (two) times daily.     folic acid (FOLVITE) 800 MCG tablet Take 800 mcg by mouth daily.      HYDROcodone-acetaminophen (NORCO/VICODIN) 5-325 MG tablet Take 1 tablet by mouth every 6 (six) hours as needed. 60 tablet 0   hydrocortisone 2.5 % cream APPLY 500 MG TOPICALLY 2 (TWO) TIMES DAILY AS NEEDED. 28 g 2   icosapent Ethyl (VASCEPA) 1 g capsule Take 2 capsules (2 g total) by mouth 2 (two) times daily. 120 capsule 8   lidocaine-prilocaine (EMLA) cream Apply 1 Application topically as needed. 30 g 0   loratadine (CLARITIN) 10 MG tablet Take 10 mg by mouth daily.     ondansetron (ZOFRAN) 8 MG tablet Take 1 tablet (8 mg total) by mouth every 8 (eight) hours as needed. (Patient not taking: Reported on 08/26/2023) 30 tablet 0   predniSONE (DELTASONE) 20 MG tablet Take 3 tablets (60 mg total) by mouth daily with breakfast. 15 tablet 5   prochlorperazine (COMPAZINE) 10 MG tablet Take 1 tablet (10 mg total) by mouth every 6 (six) hours as needed for nausea or vomiting. (Patient not taking: Reported on 08/26/2023) 30 tablet 0   ramipril (ALTACE) 2.5 MG capsule TAKE 1 CAPSULE BY MOUTH EVERY DAY 90 capsule 3   rosuvastatin (CRESTOR) 10 MG tablet TAKE 1 TABLET BY MOUTH EVERY DAY 90 tablet 3   vitamin C (ASCORBIC ACID) 500 MG tablet Take 1,000 mg by mouth daily.      vitamin E 400 UNIT capsule Take 400 Units by mouth daily.     No current facility-administered medications for this visit.    REVIEW OF SYSTEMS:   Constitutional: ( - ) fevers, ( - )  chills , ( - ) night sweats Eyes: ( - ) blurriness of vision, ( - ) double vision, ( - ) watery eyes Ears, nose, mouth, throat, and face: ( - ) mucositis, ( - ) sore throat Respiratory: ( - ) cough, ( - ) dyspnea, ( - ) wheezes Cardiovascular: ( - ) palpitation, ( - ) chest discomfort, ( - ) lower extremity swelling Gastrointestinal:  ( - ) nausea, ( - ) heartburn, ( - ) change in bowel habits  Skin: ( - ) abnormal skin rashes Lymphatics:  ( - ) new lymphadenopathy, ( - ) easy bruising Neurological: ( - ) numbness, ( - ) tingling, ( - ) new weaknesses Behavioral/Psych: ( - ) mood change, ( - ) new changes  All other systems were reviewed with the patient and are negative.  PHYSICAL EXAMINATION: ECOG PERFORMANCE STATUS: 1 - Symptomatic but completely ambulatory  Vitals:   10/28/23 0918  BP: 110/74  Pulse: 82  Resp: 16  Temp: (!) 97.5 F (36.4 C)  SpO2: 99%     Filed Weights   10/28/23 0918  Weight: 195 lb 9.6 oz (88.7 kg)      GENERAL: Well-appearing elderly Caucasian male, alert, no distress and comfortable SKIN: skin color, texture, turgor are normal, significant lesions. Mild erythematous rash on chest and forehead, chest, and arms EYES: conjunctiva are pink and non-injected, sclera clear LUNGS: clear to auscultation and percussion with normal breathing effort HEART: regular rate & rhythm and no murmurs and no lower extremity edema Musculoskeletal: no cyanosis of digits and no clubbing  PSYCH: alert & oriented x 3, fluent speech NEURO: no focal motor/sensory deficits  LABORATORY DATA:  I have reviewed the data as listed    Latest Ref Rng & Units 10/28/2023    8:46 AM 10/07/2023    9:20 AM 09/15/2023    9:05 AM  CBC  WBC 4.0 - 10.5 K/uL 6.3  4.2  6.1   Hemoglobin 13.0 - 17.0 g/dL 19.1  47.8  29.5   Hematocrit 39.0 - 52.0 % 33.6  35.8  33.9   Platelets 150 - 400 K/uL 212  214  177        Latest Ref Rng & Units 10/07/2023    9:20 AM 09/15/2023    9:05 AM 08/26/2023   10:01 AM  CMP  Glucose 70 - 99 mg/dL 93  621  308   BUN 8 - 23 mg/dL 13  20  17    Creatinine 0.61 - 1.24 mg/dL 6.57  8.46  9.62   Sodium 135 - 145 mmol/L 140  137  138   Potassium 3.5 - 5.1 mmol/L 3.8  3.9  3.9   Chloride 98 - 111 mmol/L 109  105  106   CO2 22 - 32 mmol/L 24  25  24    Calcium 8.9 - 10.3 mg/dL 9.5  8.9  9.0   Total Protein 6.5 - 8.1 g/dL 7.0  7.0  7.0   Total Bilirubin 0.3 - 1.2 mg/dL 0.4  0.4  0.3   Alkaline Phos  38 - 126 U/L 68  70  57   AST 15 - 41 U/L 22  20  20    ALT 0 - 44 U/L 32  22  18     Lab Results  Component Value Date   MPROTEIN Not Observed 11/05/2016   Lab Results  Component Value Date   KPAFRELGTCHN 2.46 (H) 11/03/2015   KPAFRELGTCHN 2.86 (H) 09/16/2011   KPAFRELGTCHN 2.86 (H) 09/16/2011   LAMBDASER 1.71 11/03/2015   LAMBDASER 2.65 (H) 09/16/2011   LAMBDASER 2.65 (H) 09/16/2011   KAPLAMBRATIO 1.25 11/05/2016   KAPLAMBRATIO 1.44 11/03/2015   KAPLAMBRATIO 1.08 09/16/2011   KAPLAMBRATIO 1.08 09/16/2011    RADIOGRAPHIC STUDIES: No results found.  ASSESSMENT & PLAN JONANTHAN ANDREOLA is a 77 y.o.. male with medical history significant for diffuse large B-cell lymphoma who presents for a follow up visit.   #Diffuse Large B-cell Lymphoma, Stage III --Underwent US guided  biopsy of pelvic mass on 06/24/2023.  Pathology is consistent with B-cell lymphoma with a high proliferation rate of 80-90%. Morphologically the findings are most consistent with a diffuse large B-cell lymphoma. FISH studies r/o Burkitt's lymphoma or double hit lymphoma. --Pre treatment PET CT scan on 07/06/2023 showed right external iliac nodal chain measuring 13.9 x 12.1 cm with an SUV max of 34.34. Multiple tracer avid lymph nodes are identified within the chest, abdomen and pelvis compatible with lymphoma. Consistent with Stage III disease.  PLAN: --Due for Cycle 6, Day 1 of R-CHOP chemotherapy. --Labs today show white blood cell labs today show white blood cell count 6.3, hemoglobin 1.3, MCV 99.4, and platelets of 212 with normal creatinine and LFTs. --Proceed with treatment today without any dose modifications.  --interval PET CT on 09/09/2023 showed areas of concern including 2 new sites of tracer avid peritoneal soft tissue along the undersurface of the right lower quadrant.  This measured Deauville criteria 5.  This was discussed with IR who noted the lesions were too small for biopsy and recommended continued  observation.  There is also decreased intensity of the right external iliac nodes with the area measuring Deauville criteria 4 --RTC in 4 weeks for labs, follow up with post treatment PET CT scan.   #Rash: --Possibly related to antibiotic therapy --Patient is currently taking claritin daily. We will monitor his rash while he takes his prednisone over the next few days.  -- Prescribed hydrocortisone 2.5% cream sent to his pharmacy.  #Right groin pain: --Pain has improved and well controlled with Norco 5-325 mg BID as needed.  #H/O DVT involving right  --Diagnosed 06/12/2022, secondary to underlying lymphoma --Currently on Eliquis 5 mg twice daily  #Supportive Care -- chemotherapy education complete -- port placed -- zofran 8mg  q8H PRN and compazine 10mg  PO q6H for nausea -- allopurinol 300mg  PO daily for TLS prophylaxis -- EMLA cream for port -- no pain medication required at this time.    No orders of the defined types were placed in this encounter.   All questions were answered. The patient knows to call the clinic with any problems, questions or concerns.  I have spent a total of 30 minutes minutes of face-to-face and non-face-to-face time, preparing to see the patient,performing a medically appropriate examination, counseling and educating the patient, ordering tests/procedures, documenting clinical information in the electronic health record, independently interpreting results and communicating results to the patient, and care coordination.   Ulysees Barns, MD Department of Hematology/Oncology [facility] Phone: [phone] Pager: [phone] Email: [email or web address]  10/28/2023 9:39 AM

## 2023-10-31 ENCOUNTER — Inpatient Hospital Stay: Payer: Medicare Other

## 2023-10-31 VITALS — BP 113/73 | HR 77 | Temp 97.8°F | Resp 18

## 2023-10-31 DIAGNOSIS — C8338 Diffuse large B-cell lymphoma, lymph nodes of multiple sites: Secondary | ICD-10-CM

## 2023-10-31 DIAGNOSIS — Z5112 Encounter for antineoplastic immunotherapy: Secondary | ICD-10-CM | POA: Diagnosis not present

## 2023-10-31 MED ORDER — PEGFILGRASTIM INJECTION 6 MG/0.6ML ~~LOC~~
6.0000 mg | PREFILLED_SYRINGE | Freq: Once | SUBCUTANEOUS | Status: AC
Start: 2023-10-31 — End: 2023-10-31
  Administered 2023-10-31: 6 mg via SUBCUTANEOUS
  Filled 2023-10-31: qty 0.6

## 2023-11-01 ENCOUNTER — Other Ambulatory Visit: Payer: Self-pay

## 2023-11-21 ENCOUNTER — Encounter (HOSPITAL_COMMUNITY)
Admission: RE | Admit: 2023-11-21 | Discharge: 2023-11-21 | Disposition: A | Discharge status: Home / Self Care | Payer: Medicare Other | Source: Ambulatory Visit | Attending: Hematology and Oncology | Admitting: Hematology and Oncology

## 2023-11-21 DIAGNOSIS — C8338 Diffuse large B-cell lymphoma, lymph nodes of multiple sites: Secondary | ICD-10-CM | POA: Diagnosis present

## 2023-11-21 LAB — GLUCOSE, CAPILLARY: Glucose-Capillary: 91 mg/dL (ref 70–99)

## 2023-11-21 MED ORDER — FLUDEOXYGLUCOSE F - 18 (FDG) INJECTION
10.0000 | Freq: Once | INTRAVENOUS | Status: AC
  Administered 2023-11-21: 9.66 via INTRAVENOUS

## 2023-12-06 ENCOUNTER — Inpatient Hospital Stay: Payer: Medicare Other

## 2023-12-06 ENCOUNTER — Inpatient Hospital Stay: Payer: Medicare Other | Attending: Physician Assistant | Admitting: Hematology and Oncology

## 2023-12-06 ENCOUNTER — Other Ambulatory Visit: Payer: Self-pay | Admitting: Hematology and Oncology

## 2023-12-06 DIAGNOSIS — C8338 Diffuse large B-cell lymphoma, lymph nodes of multiple sites: Secondary | ICD-10-CM | POA: Insufficient documentation

## 2023-12-06 DIAGNOSIS — Z95828 Presence of other vascular implants and grafts: Secondary | ICD-10-CM

## 2023-12-06 DIAGNOSIS — Z7901 Long term (current) use of anticoagulants: Secondary | ICD-10-CM | POA: Insufficient documentation

## 2023-12-06 DIAGNOSIS — Z803 Family history of malignant neoplasm of breast: Secondary | ICD-10-CM | POA: Diagnosis not present

## 2023-12-06 DIAGNOSIS — Z8249 Family history of ischemic heart disease and other diseases of the circulatory system: Secondary | ICD-10-CM | POA: Diagnosis not present

## 2023-12-06 DIAGNOSIS — Z5111 Encounter for antineoplastic chemotherapy: Secondary | ICD-10-CM | POA: Diagnosis present

## 2023-12-06 DIAGNOSIS — E785 Hyperlipidemia, unspecified: Secondary | ICD-10-CM | POA: Diagnosis not present

## 2023-12-06 DIAGNOSIS — Z8546 Personal history of malignant neoplasm of prostate: Secondary | ICD-10-CM | POA: Diagnosis not present

## 2023-12-06 DIAGNOSIS — Z79899 Other long term (current) drug therapy: Secondary | ICD-10-CM | POA: Diagnosis not present

## 2023-12-06 DIAGNOSIS — I252 Old myocardial infarction: Secondary | ICD-10-CM | POA: Diagnosis not present

## 2023-12-06 DIAGNOSIS — I2510 Atherosclerotic heart disease of native coronary artery without angina pectoris: Secondary | ICD-10-CM | POA: Diagnosis not present

## 2023-12-06 DIAGNOSIS — Z87891 Personal history of nicotine dependence: Secondary | ICD-10-CM | POA: Insufficient documentation

## 2023-12-06 DIAGNOSIS — Z86718 Personal history of other venous thrombosis and embolism: Secondary | ICD-10-CM | POA: Insufficient documentation

## 2023-12-06 DIAGNOSIS — I700 Atherosclerosis of aorta: Secondary | ICD-10-CM | POA: Insufficient documentation

## 2023-12-06 DIAGNOSIS — K8020 Calculus of gallbladder without cholecystitis without obstruction: Secondary | ICD-10-CM | POA: Insufficient documentation

## 2023-12-06 DIAGNOSIS — R21 Rash and other nonspecific skin eruption: Secondary | ICD-10-CM | POA: Insufficient documentation

## 2023-12-06 DIAGNOSIS — K5730 Diverticulosis of large intestine without perforation or abscess without bleeding: Secondary | ICD-10-CM | POA: Diagnosis not present

## 2023-12-06 DIAGNOSIS — Z5112 Encounter for antineoplastic immunotherapy: Secondary | ICD-10-CM | POA: Diagnosis present

## 2023-12-06 LAB — CBC WITH DIFFERENTIAL (CANCER CENTER ONLY)
Abs Immature Granulocytes: 0.04 10*3/uL (ref 0.00–0.07)
Basophils Absolute: 0 10*3/uL (ref 0.0–0.1)
Basophils Relative: 1 %
Eosinophils Absolute: 0.1 10*3/uL (ref 0.0–0.5)
Eosinophils Relative: 3 %
HCT: 37.1 % — ABNORMAL LOW (ref 39.0–52.0)
Hemoglobin: 12.4 g/dL — ABNORMAL LOW (ref 13.0–17.0)
Immature Granulocytes: 2 %
Lymphocytes Relative: 8 %
Lymphs Abs: 0.2 10*3/uL — ABNORMAL LOW (ref 0.7–4.0)
MCH: 34 pg (ref 26.0–34.0)
MCHC: 33.4 g/dL (ref 30.0–36.0)
MCV: 101.6 fL — ABNORMAL HIGH (ref 80.0–100.0)
Monocytes Absolute: 0.6 10*3/uL (ref 0.1–1.0)
Monocytes Relative: 22 %
Neutro Abs: 1.8 10*3/uL (ref 1.7–7.7)
Neutrophils Relative %: 64 %
Platelet Count: 160 10*3/uL (ref 150–400)
RBC: 3.65 MIL/uL — ABNORMAL LOW (ref 4.22–5.81)
RDW: 13 % (ref 11.5–15.5)
WBC Count: 2.7 10*3/uL — ABNORMAL LOW (ref 4.0–10.5)
nRBC: 0 % (ref 0.0–0.2)

## 2023-12-06 LAB — CMP (CANCER CENTER ONLY)
ALT: 32 U/L (ref 0–44)
AST: 24 U/L (ref 15–41)
Albumin: 4.2 g/dL (ref 3.5–5.0)
Alkaline Phosphatase: 63 U/L (ref 38–126)
Anion gap: 7 (ref 5–15)
BUN: 17 mg/dL (ref 8–23)
CO2: 27 mmol/L (ref 22–32)
Calcium: 9.2 mg/dL (ref 8.9–10.3)
Chloride: 108 mmol/L (ref 98–111)
Creatinine: 0.76 mg/dL (ref 0.61–1.24)
GFR, Estimated: >60 mL/min (ref >60)
Glucose, Bld: 90 mg/dL (ref 70–99)
Potassium: 4 mmol/L (ref 3.5–5.1)
Sodium: 142 mmol/L (ref 135–145)
Total Bilirubin: 0.4 mg/dL (ref <1.2)
Total Protein: 6.5 g/dL (ref 6.5–8.1)

## 2023-12-06 LAB — LACTATE DEHYDROGENASE: LDH: 156 U/L (ref 98–192)

## 2023-12-06 MED ORDER — SODIUM CHLORIDE 0.9% FLUSH
10.0000 mL | Freq: Once | INTRAVENOUS | Status: AC
  Administered 2023-12-06: 10 mL

## 2023-12-06 MED ORDER — HEPARIN SOD (PORK) LOCK FLUSH 100 UNIT/ML IV SOLN
500.0000 [IU] | Freq: Once | INTRAVENOUS | Status: AC
  Administered 2023-12-06: 500 [IU]

## 2023-12-06 NOTE — Progress Notes (Signed)
[facility] [phone]   Fax:([phone]  PROGRESS NOTE  Patient Care Team: Orpha Bur, MD as PCP - General (Family Medicine) Quintella Reichert, MD as PCP - Cardiology (Cardiology) Crist Fat, MD as Attending Physician (Urology) Margaretmary Dys, MD as Consulting Physician (Radiation Oncology) Maryclare Labrador, RN as Registered Nurse Jaci Standard, MD as Consulting Physician (Hematology and Oncology)  Hematological/Oncological History # Diffuse Large B Cell Lymphoma, Stage III Presented to PCP with swelling and redness in his right leg x 3 weeks. 06/13/2023: Doppler US showed acute DVT involving right  common femoral vein, SF junction, right femoral vein, right proximal profunda vein, and external iliac vein. Incident finding included pelvic lesion with minimum vascularity anterior to the prostate, measuring 11.8 x 9.6 12.6 cm, with a volume of 749.3 cubic centimeters. Prior MR on 04/11/2021 reports 1.7 x .07 x 1.1 cm category 4 lesion. This lesion appears to be causing extrinsic compression on the external iliac vein (proximal external iliac vein not visualized due to extrinsic compression).  06/16/2023: CT angio abdomen/pelvis: Large lobulated mass in the right pelvis encasing the right iliac vessels and abutting the anterior pelvic wall as well as right iliacus muscle and right bladder dome. This mass consistent with a neoplastic process and features most suggestive of lymphoma.Multiple splenic lesions most consistent with metastatic disease.Short segment thick wall small bowel in the mid abdomen to the right of midline most consistent with infiltrative malignancy/lymphoma. Right pelvic sidewall mass/adenopathy.Cholelithiasis. Colonic diverticulosis. Aortic Atherosclerosis. 06/21/2023: Establish care with [facility].  06/24/2023: US guided biopsy of abdominal mass. Pathology is consistent with B-cell lymphoma with a high proliferation rate of  80-90%. Morphologically the findings are most consistent with a diffuse large B-cell lymphoma. FISH studies r/o Burkitt's lymphoma or double hit lymphoma. 07/15/2023: Cycle 1 Day 1 of R-CHOP chemotherapy.  08/05/2023: Cycle 2 Day 1 of R-CHOP chemotherapy.  08/26/2023: Cycle 3 Day 1 of R-CHOP chemotherapy.  09/15/2023: Cycle 4 Day 1 of R-CHOP chemotherapy.  10/07/2023: Cycle 5 Day 1 of R-CHOP chemotherapy.  10/28/2023: Cycle 6 Day 1 of R-CHOP chemotherapy.   Interval History:  Eugene Moreno 77 y.o. male with medical history significant for diffuse large B-cell lymphoma who presents for a follow up visit. The patient's last visit was on 10/28/2023. In the interim, he continues on R-CHOP chemotherapy. He presents today after completing Cycle 6, Day 1. He is accompanied by his wife for this visit.   On exam today Eugene Moreno reports he has felt well overall with interim since her last visit.  He is had no infectious symptoms such as fevers, chills, sweats.  He reports is not having any pain in his groin.  He does have some periodic flaring of a rash predominantly on his chest, arms and back.  He reports the steroid cream does help with this.  His weight has been increasing and his appetite has been strong.  He is not having any difficulty with his port.  Overall he is at his baseline level of health but is deeply concerned about the results of his PET CT scan.  He denies fevers, chills, sweats, shortness of breath, chest pain, cough, headaches, dizziness or peripheral neuropathy. Overall he is willing and able to proceed with chemotherapy today.  A full 10 point ROS is otherwise negative.  Today we discussed the results of his PET CT scan.  Findings at this time are difficult to interpret.  This may represent residual  lymphoma or potentially represent an infectious process.  At this time it is unclear and I would recommend tissue sampling in order to confirm.  I have reached out to surgery to see if they  would be able to perform a biopsy/evaluation of this area.  If that is not feasible would consider IR guided biopsy of the lesion.  The patient and his wife voiced understanding of our findings and plan moving forward.  MEDICAL HISTORY:  Past Medical History:  Diagnosis Date   Coronary artery disease    cardiologist--- dr t. turner;  hx AWMI 09-06-2001  s/p cath with PCI and DES x1 to prox LAD;  last nuclear study 05-10-2016 in epic, anterior scar without ishcemia, nuclear ef 44%)   History of MI (myocardial infarction) 09/06/2001   anterior wall   Hyperlipemia    Hypertension    Leukopenia    chronic mild   Non Hodgkin's lymphoma (HCC)    Prostate cancer [facility])    urologist-- dr Marlou Porch--- dx 05/ 2022, Gleason 4+3, PSA 8.04   S/P drug eluting coronary stent placement 09/06/2001   x1 to prox LAD   Wears glasses    Wears hearing aid in both ears     SURGICAL HISTORY: Past Surgical History:  Procedure Laterality Date   ANTERIOR CERVICAL DECOMP/DISCECTOMY FUSION  01/26/2002   @[facility]  by dr Jeral Fruit;   C6-C7   COLONOSCOPY  2020   CORONARY ANGIOPLASTY WITH STENT PLACEMENT  09/06/2001   @[facility]  by dr s. 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   SPACE OAR INSTILLATION N/A 09/03/2021   Procedure: SPACE OAR INSTILLATION;  Surgeon: Crist Fat, MD;  Location: [facility];  Service: Urology;  Laterality: N/A;   WISDOM TOOTH EXTRACTION      SOCIAL HISTORY: Social History   Socioeconomic History   Marital status: Married    Spouse name: Not on file   Number of children: Not on file   Years of education: Not on file   Highest education  level: Not on file  Occupational History   Occupation: retired    Associate Professor: [employer]    Comment: Systems analyst  Tobacco Use   Smoking status: Former    Current packs/day: 0.00    Types: Cigarettes    Start date: 1965    Quit date: 1969    Years since quitting: 55.9   Smokeless tobacco: Never  Vaping Use   Vaping status: Never Used  Substance and Sexual Activity   Alcohol use: Yes    Comment: occasional   Drug use: Never   Sexual activity: Yes    Comment: vasectomy  Other Topics Concern   Not on file  Social History Narrative   Not on file   Social Drivers of Health   Financial Resource Strain: Not on file  Food Insecurity: No Food Insecurity (06/21/2023)   Hunger Vital Sign    Worried About Running Out of Food in the Last Year: Never true    Ran Out of Food in the Last Year: Never true  Transportation Needs: No Transportation Needs (06/21/2023)   PRAPARE - Transportation    Lack  of Transportation (Medical): No    Lack of Transportation (Non-Medical): No  Physical Activity: Not on file  Stress: Not on file  Social Connections: Not on file  Intimate Partner Violence: Not At Risk (06/21/2023)   Humiliation, Afraid, Rape, and Kick questionnaire    Fear of Current or Ex-Partner: No    Emotionally Abused: No    Physically Abused: No    Sexually Abused: No    FAMILY HISTORY: Family History  Problem Relation Age of Onset   Breast cancer Mother 73   Heart attack Father        x3 between ages 6-64   Other Sister 34       angioplasty    ALLERGIES:  is allergic to niacin and related.  MEDICATIONS:  Current Outpatient Medications  Medication Sig Dispense Refill   allopurinol (ZYLOPRIM) 300 MG tablet Take 1 tablet (300 mg total) by mouth daily. 90 tablet 1   apixaban (ELIQUIS) 5 MG TABS tablet Take 1 tablet (5 mg total) by mouth 2 (two) times daily. Start taking after completion of starter pack. 60 tablet 5   aspirin EC 81 MG tablet Take 2 tablets (162 mg  total) by mouth daily.     ciprofloxacin (CIPRO) 500 MG tablet Take 1 tablet (500 mg total) by mouth 2 (two) times daily. 28 tablet 0   Cod Liver Oil CAPS Take 2 capsules by mouth daily.     ferrous fumarate (HEMOCYTE - 106 MG FE) 325 (106 FE) MG TABS Take 1 tablet by mouth daily.     fluticasone (FLONASE) 50 MCG/ACT nasal spray Place 1 spray into both nostrils 2 (two) times daily.     folic acid (FOLVITE) 800 MCG tablet Take 800 mcg by mouth daily.      HYDROcodone-acetaminophen (NORCO/VICODIN) 5-325 MG tablet Take 1 tablet by mouth every 6 (six) hours as needed. 60 tablet 0   hydrocortisone 2.5 % cream APPLY 500 MG TOPICALLY 2 (TWO) TIMES DAILY AS NEEDED. 28 g 2   icosapent Ethyl (VASCEPA) 1 g capsule Take 2 capsules (2 g total) by mouth 2 (two) times daily. 120 capsule 8   lidocaine-prilocaine (EMLA) cream Apply 1 Application topically as needed. 30 g 0   loratadine (CLARITIN) 10 MG tablet Take 10 mg by mouth daily.     ondansetron (ZOFRAN) 8 MG tablet Take 1 tablet (8 mg total) by mouth every 8 (eight) hours as needed. (Patient not taking: Reported on 08/26/2023) 30 tablet 0   predniSONE (DELTASONE) 20 MG tablet Take 3 tablets (60 mg total) by mouth daily with breakfast. 15 tablet 5   prochlorperazine (COMPAZINE) 10 MG tablet Take 1 tablet (10 mg total) by mouth every 6 (six) hours as needed for nausea or vomiting. (Patient not taking: Reported on 08/26/2023) 30 tablet 0   ramipril (ALTACE) 2.5 MG capsule TAKE 1 CAPSULE BY MOUTH EVERY DAY 90 capsule 3   rosuvastatin (CRESTOR) 10 MG tablet TAKE 1 TABLET BY MOUTH EVERY DAY 90 tablet 3   vitamin C (ASCORBIC ACID) 500 MG tablet Take 1,000 mg by mouth daily.      vitamin E 400 UNIT capsule Take 400 Units by mouth daily.     No current facility-administered medications for this visit.    REVIEW OF SYSTEMS:   Constitutional: ( - ) fevers, ( - )  chills , ( - ) night sweats Eyes: ( - ) blurriness of vision, ( - ) double vision, ( - ) watery  eyes Ears,  nose, mouth, throat, and face: ( - ) mucositis, ( - ) sore throat Respiratory: ( - ) cough, ( - ) dyspnea, ( - ) wheezes Cardiovascular: ( - ) palpitation, ( - ) chest discomfort, ( - ) lower extremity swelling Gastrointestinal:  ( - ) nausea, ( - ) heartburn, ( - ) change in bowel habits Skin: ( - ) abnormal skin rashes Lymphatics: ( - ) new lymphadenopathy, ( - ) easy bruising Neurological: ( - ) numbness, ( - ) tingling, ( - ) new weaknesses Behavioral/Psych: ( - ) mood change, ( - ) new changes  All other systems were reviewed with the patient and are negative.  PHYSICAL EXAMINATION: ECOG PERFORMANCE STATUS: 1 - Symptomatic but completely ambulatory  There were no vitals filed for this visit. There were no vitals filed for this visit.  GENERAL: Well-appearing elderly Caucasian male, alert, no distress and comfortable SKIN: skin color, texture, turgor are normal, significant lesions. Mild erythematous rash on chest and forehead, chest, and arms EYES: conjunctiva are pink and non-injected, sclera clear LUNGS: clear to auscultation and percussion with normal breathing effort HEART: regular rate & rhythm and no murmurs and no lower extremity edema Musculoskeletal: no cyanosis of digits and no clubbing  PSYCH: alert & oriented x 3, fluent speech NEURO: no focal motor/sensory deficits  LABORATORY DATA:  I have reviewed the data as listed    Latest Ref Rng & Units 12/06/2023   10:23 AM 10/28/2023    8:46 AM 10/07/2023    9:20 AM  CBC  WBC 4.0 - 10.5 K/uL 2.7  6.3  4.2   Hemoglobin 13.0 - 17.0 g/dL 38.7  56.4  33.2   Hematocrit 39.0 - 52.0 % 37.1  33.6  35.8   Platelets 150 - 400 K/uL 160  212  214        Latest Ref Rng & Units 12/06/2023   10:23 AM 10/28/2023    8:46 AM 10/07/2023    9:20 AM  CMP  Glucose 70 - 99 mg/dL 90  951  93   BUN 8 - 23 mg/dL 17  15  13    Creatinine 0.61 - 1.24 mg/dL 8.84  1.66  0.63   Sodium 135 - 145 mmol/L 142  139  140   Potassium  3.5 - 5.1 mmol/L 4.0  3.9  3.8   Chloride 98 - 111 mmol/L 108  109  109   CO2 22 - 32 mmol/L 27  25  24    Calcium 8.9 - 10.3 mg/dL 9.2  8.8  9.5   Total Protein 6.5 - 8.1 g/dL 6.5  6.6  7.0   Total Bilirubin <1.2 mg/dL 0.4  0.3  0.4   Alkaline Phos 38 - 126 U/L 63  61  68   AST 15 - 41 U/L 24  20  22    ALT 0 - 44 U/L 32  25  32     Lab Results  Component Value Date   MPROTEIN Not Observed 11/05/2016   Lab Results  Component Value Date   KPAFRELGTCHN 2.46 (H) 11/03/2015   KPAFRELGTCHN 2.86 (H) 09/16/2011   KPAFRELGTCHN 2.86 (H) 09/16/2011   LAMBDASER 1.71 11/03/2015   LAMBDASER 2.65 (H) 09/16/2011   LAMBDASER 2.65 (H) 09/16/2011   KAPLAMBRATIO 1.25 11/05/2016   KAPLAMBRATIO 1.44 11/03/2015   KAPLAMBRATIO 1.08 09/16/2011   KAPLAMBRATIO 1.08 09/16/2011    RADIOGRAPHIC STUDIES: NM PET Image Restag (PS) Skull Base To Thigh Result Date: 11/29/2023 CLINICAL DATA:  Subsequent  treatment strategy for diffuse B-cell lymphoma. EXAM: NUCLEAR MEDICINE PET SKULL BASE TO THIGH TECHNIQUE: 9.66 mCi F-18 FDG was injected intravenously. Full-ring PET imaging was performed from the skull base to thigh after the radiotracer. CT data was obtained and used for attenuation correction and anatomic localization. Fasting blood glucose: 91 mg/dl COMPARISON:  PET-CT 95/28/4132. FINDINGS: Mediastinal blood pool activity: SUV max 2.0 Liver activity: SUV max 3.5 NECK: No hypermetabolic lymph nodes in the neck. Incidental CT findings: None. CHEST: No hypermetabolic mediastinal or hilar nodes. No suspicious pulmonary nodules on the CT scan. Incidental CT findings: Atherosclerotic calcifications in the thoracic aorta as well as the left main, left anterior descending and right coronary arteries. Right internal jugular single-lumen porta cath with tip terminating in the right atrium. ABDOMEN/PELVIS: Previously noted nodal mass in the right inguinal region (axial image 175 of series 4) currently measures 5.7 x 3.8 cm  (previously 7.0 x 5.1 cm on 09/09/2023) and demonstrates persistent predominantly peripheral hypermetabolism (SUVmax = 8.7 (previously 10.8)) indicative of residual disease (Deauville 4). Previously noted soft tissue mass deep to the lower right anterior abdominal wall musculature has regressed and is no longer associated with hypermetabolism. No definite residual mesenteric lymphadenopathy or hypermetabolism. Incidental CT findings: Atherosclerosis in the abdominal aorta and pelvic vasculature. Numerous colonic diverticuli are noted, without surrounding inflammatory changes to indicate an acute diverticulitis at this time. Multiple faintly calcified gallstones lie dependently in the gallbladder. Brachytherapy implants in the prostate gland. SKELETON: No focal hypermetabolic activity to suggest skeletal metastasis. Incidental CT findings: None. IMPRESSION: 1. Today's study demonstrates continued positive response to therapy with regression of the previously noted soft tissue mass along the undersurface of the right anterior abdominal wall musculature, and resolution of previously noted mesenteric lymphadenopathy and hypermetabolism. However, there is a persistent nodal mass in the right inguinal region which has slightly decreased in size but maintains peripheral predominant hypermetabolism (Deauville 4), similar to the prior study. No new sites of disease are noted elsewhere in the neck, chest, abdomen or pelvis. 2. Aortic atherosclerosis, in addition to left main and 2 vessel coronary artery disease. Assessment for potential risk factor modification, dietary therapy or pharmacologic therapy may be warranted, if clinically indicated. 3. Colonic diverticulosis without evidence of acute diverticulitis at this time. 4. Cholelithiasis without evidence of acute cholecystitis. 5. Additional incidental findings, as above. Electronically Signed   By: Trudie Reed M.D.   On: 11/29/2023 08:02    ASSESSMENT &  PLAN Eugene Moreno is a 77 y.o.. male with medical history significant for diffuse large B-cell lymphoma who presents for a follow up visit.   #Diffuse Large B-cell Lymphoma, Stage III --Underwent US guided biopsy of pelvic mass on 06/24/2023.  Pathology is consistent with B-cell lymphoma with a high proliferation rate of 80-90%. Morphologically the findings are most consistent with a diffuse large B-cell lymphoma. FISH studies r/o Burkitt's lymphoma or double hit lymphoma. --Pre treatment PET CT scan on 07/06/2023 showed right external iliac nodal chain measuring 13.9 x 12.1 cm with an SUV max of 34.34. Multiple tracer avid lymph nodes are identified within the chest, abdomen and pelvis compatible with lymphoma. Consistent with Stage III disease.  PLAN: --completed 6 cycles of R-CHOP chemotherapy. --Labs today show white blood cell labs today show white blood cell count 2.7, Hgb 12.4, MCV 101.6, Plt 160 with normal creatinine and LFTs. --Proceed with treatment today without any dose modifications.  --interval PET CT on 09/09/2023 showed areas of concern including 2 new sites  of tracer avid peritoneal soft tissue along the undersurface of the right lower quadrant.  This measured Deauville criteria 5.  This was discussed with IR who noted the lesions were too small for biopsy and recommended continued observation.  There is also decreased intensity of the right external iliac nodes with the area measuring Deauville criteria 4 --post treatment PET CT scan showed positive response to therapy with regression of the previously noted soft tissue mass along the undersurface of the right anterior abdominal wall musculature, and resolution of previously noted mesenteric lymphadenopathy and hypermetabolism. However, there is a persistent nodal mass in the right inguinal region which has slightly decreased in size but maintains peripheral predominant hypermetabolism (Deauville 4) --will discuss these findings with  surgery to see if biopsy is still untenable. Additionally can discuss with IR Tissue sampling would be preferred to confirm this is not residual lymphoma.  --RTC in 12 weeks for labs, clinic visit and  CT scan.   #Rash: --Possibly related to antibiotic therapy --Patient is currently taking claritin daily. We will monitor his rash while he takes his prednisone over the next few days.  -- Prescribed hydrocortisone 2.5% cream sent to his pharmacy.  #Right groin pain: --Pain has improved and well controlled with Norco 5-325 mg BID as needed.  #H/O DVT involving right  --Diagnosed 06/12/2022, secondary to underlying lymphoma --Currently on Eliquis 5 mg twice daily  #Supportive Care -- chemotherapy education complete -- port placed. Will keep port in place until the results of the PET CT are clarified with biopsy.  -- zofran 8mg  q8H PRN and compazine 10mg  PO q6H for nausea -- allopurinol 300mg  PO daily for TLS prophylaxis -- EMLA cream for port -- no pain medication required at this time.   No orders of the defined types were placed in this encounter.  All questions were answered. The patient knows to call the clinic with any problems, questions or concerns.  I have spent a total of 30 minutes minutes of face-to-face and non-face-to-face time, preparing to see the patient,performing a medically appropriate examination, counseling and educating the patient, ordering tests/procedures, documenting clinical information in the electronic health record, independently interpreting results and communicating results to the patient, and care coordination.   Ulysees Barns, MD Department of Hematology/Oncology [facility] at [facility] Phone: [phone] Pager: [phone] Email: [email or web address]  12/06/2023 3:50 PM

## 2023-12-07 ENCOUNTER — Telehealth: Payer: Self-pay | Admitting: Hematology and Oncology

## 2023-12-08 ENCOUNTER — Other Ambulatory Visit: Payer: Self-pay

## 2023-12-09 ENCOUNTER — Other Ambulatory Visit: Payer: Self-pay

## 2023-12-28 ENCOUNTER — Other Ambulatory Visit: Payer: Self-pay | Admitting: Hematology and Oncology

## 2024-01-02 ENCOUNTER — Other Ambulatory Visit: Payer: Self-pay | Admitting: Hematology and Oncology

## 2024-01-02 ENCOUNTER — Telehealth: Payer: Self-pay | Admitting: *Deleted

## 2024-01-02 DIAGNOSIS — C8338 Diffuse large B-cell lymphoma, lymph nodes of multiple sites: Secondary | ICD-10-CM

## 2024-01-02 NOTE — Progress Notes (Signed)
 Karalee Wilkie POUR, MD  Kyrel Leighton Approved for US  Guided core biopsy of right deep inguinal nodal mass.   * mass is centrally necrotic, aim for FDG avid margin * mass likely encases the inferior epigastric artery, use color on US  to ensure safety of trajectory  HKM       Previous Messages    ----- Message ----- From: Jahred Tatar Sent: 01/02/2024   1:50 PM EST To: Thales Knipple; Ir Procedure Requests Subject: CT Biopsy                                      Procedure : CT Biopsy  Reason: PET avid nodal mass in the right inguinal region, requesting biopsy Dx: Diffuse large B-cell lymphoma of lymph nodes of multiple regions (HCC) [C83.38 (ICD-10-CM)]    History :NM Pet Restage Skull base to Thigh , CT Angio abd pelvis w/wo  Provider : Federico Norleen ONEIDA MADISON, MD  Provider contact : [phone]

## 2024-01-02 NOTE — Telephone Encounter (Signed)
 TCT patient regarding a biopsy of his right groin area. Spoke with patient. He is aware that Dr. Federico does want a biopsy of the residual mass in his right groin. Advised that Dr. Federico has ordered the biopsy and that he [place]) can expect a call from radiology, likely this week to schedule the biopsy. Pt voiced understanding.

## 2024-01-03 ENCOUNTER — Encounter: Payer: Self-pay | Admitting: Hematology and Oncology

## 2024-01-03 NOTE — Telephone Encounter (Signed)
 Contacted patient per Dr. Lafonda message below: Patient's biopsy is 02/08/2024. Patient has clinic appt on 01/03/24 that was scheduled to be following biopsy. I would prefer to see him post biopsy. If he would still like to come in on 01/04/24 we can see him, but I would recommend seeing us  3-10 days after the biopsy if ok with patient. Can also be scheduled with Johnston T. PA, if needed.  Patient in agreement to postpone appt with Dr.Dorsey until after biopsy completed and results available. Appts for 01/04/24 cancelled. Schedule message sent.

## 2024-01-04 ENCOUNTER — Other Ambulatory Visit: Payer: Self-pay

## 2024-01-04 ENCOUNTER — Inpatient Hospital Stay: Payer: Medicare Other | Attending: Physician Assistant

## 2024-01-04 ENCOUNTER — Inpatient Hospital Stay: Payer: Medicare Other | Admitting: Hematology and Oncology

## 2024-01-05 ENCOUNTER — Other Ambulatory Visit: Payer: Self-pay

## 2024-01-18 ENCOUNTER — Other Ambulatory Visit: Payer: Self-pay

## 2024-02-07 ENCOUNTER — Other Ambulatory Visit: Payer: Self-pay | Admitting: Radiology

## 2024-02-07 DIAGNOSIS — R1900 Intra-abdominal and pelvic swelling, mass and lump, unspecified site: Secondary | ICD-10-CM

## 2024-02-07 NOTE — H&P (Signed)
Chief Complaint: large B cell lymphoma; right inguinal node mass - image guided right deep inguinal nodal mass core biopsy  Referring Provider(s): Ulysees Barns   Supervising Physician: Irish Lack  Patient Status: [facility] - Out-pt  History of Present Illness: Eugene Moreno is a 78 y.o. male with a history of hypertension, coronary artery disease, prostate cancer, and s/p coronary stent.  Pt is with diffuse large B cell lymphoma and follows with Dr. Leonides Schanz of oncology.  Pt has undergone 6 cycles of R-CHOP with the latest being on 10/28/23.  A PET scan was obtained on 11/20/24 revealing a positive response to therapy but a persistent nodal mass in the right inguinal region.  While the right inguinal nodal mass has slightly regressed, it maintains predominant hypermetabolism.  Dr. Leonides Schanz referred pt to interventional radiology for possible biopsy for further pathologic exploration.  Pts imaging was reviewed and approved by Dr. Archer Asa on 01/02/24 for image guided core biopsy of right deep inguinal nodal mass scheduled for 02/08/24.    Patient is Full Code  Past Medical History:  Diagnosis Date   Coronary artery disease    cardiologist--- dr t. turner;  hx AWMI 09-06-2001  s/p cath with PCI and DES x1 to prox LAD;  last nuclear study 05-10-2016 in epic, anterior scar without ishcemia, nuclear ef 44%)   History of MI (myocardial infarction) 09/06/2001   anterior wall   Hyperlipemia    Hypertension    Leukopenia    chronic mild   Non Hodgkin's lymphoma (HCC)    Prostate cancer [facility])    urologist-- dr Marlou Porch--- dx 05/ 2022, Gleason 4+3, PSA 8.04   S/P drug eluting coronary stent placement 09/06/2001   x1 to prox LAD   Wears glasses    Wears hearing aid in both ears     Past Surgical History:  Procedure Laterality Date   ANTERIOR CERVICAL DECOMP/DISCECTOMY FUSION  01/26/2002   @[facility]  by dr Jeral Fruit;   C6-C7   COLONOSCOPY  2020   CORONARY ANGIOPLASTY WITH STENT PLACEMENT   09/06/2001   @[facility]  by dr s. 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 Authorizing Provider  allopurinol (ZYLOPRIM) 300 MG tablet Take 1 tablet (300 mg total) by mouth daily. 07/07/23   Jaci Standard, MD  apixaban (ELIQUIS) 5 MG TABS tablet Take 1 tablet (5 mg total) by mouth 2 (two) times daily. Start taking after completion of starter pack. 06/13/23   Pervis Hocking B, RPH-CPP  aspirin EC 81 MG tablet Take 2 tablets (162 mg total) by mouth daily. 10/18/11   Laurey Morale, MD  ciprofloxacin (CIPRO) 500 MG tablet Take 1 tablet (500 mg total) by mouth 2 (two) times daily. 09/15/23   Jaci Standard, MD  [facility] Liver Oil CAPS  Take 2 capsules by mouth daily.    [provider]  ferrous fumarate (HEMOCYTE - 106 MG FE) 325 (106 FE) MG TABS Take 1 tablet by mouth daily.    [provider]  fluticasone (FLONASE) 50 MCG/ACT nasal spray Place 1 spray into both nostrils 2 (two) times daily. 04/07/23   [provider]  folic acid (FOLVITE) 800 MCG tablet Take 800 mcg by mouth daily.     [provider]  HYDROcodone-acetaminophen (NORCO/VICODIN) 5-325 MG tablet Take 1 tablet by mouth every 6 (six) hours as needed. 08/29/23   Briant Cedar, PA-C  hydrocortisone 2.5 % cream APPLY 500 MG TOPICALLY 2 (TWO)  TIMES DAILY AS NEEDED. 10/07/23   Jaci Standard, MD  icosapent Ethyl (VASCEPA) 1 g capsule Take 2 capsules (2 g total) by mouth 2 (two) times daily. 04/26/23   Sharlene Dory, PA-C  lidocaine-prilocaine (EMLA) cream Apply 1 Application topically as needed. 07/07/23   Jaci Standard, MD  loratadine (CLARITIN) 10 MG tablet Take 10 mg by mouth daily.    [provider]  ondansetron (ZOFRAN) 8 MG tablet Take 1 tablet (8 mg total) by mouth every 8 (eight) hours as needed. Patient not taking: Reported on 08/26/2023 07/07/23   Jaci Standard, MD  predniSONE (DELTASONE) 20 MG tablet Take 3 tablets (60 mg total) by mouth daily with breakfast. 07/07/23   Jaci Standard, MD  prochlorperazine (COMPAZINE) 10 MG tablet Take 1 tablet (10 mg total) by mouth every 6 (six) hours as needed for nausea or vomiting. Patient not taking: Reported on 08/26/2023 07/07/23   Ulysees Barns IV, MD  ramipril (ALTACE) 2.5 MG capsule TAKE 1 CAPSULE BY MOUTH EVERY DAY 06/21/23   Quintella Reichert, MD  rosuvastatin (CRESTOR) 10 MG tablet TAKE 1 TABLET BY MOUTH EVERY DAY 05/09/23   Quintella Reichert, MD  vitamin C (ASCORBIC ACID) 500 MG tablet Take 1,000 mg by mouth daily.     [provider]  vitamin E 400 UNIT capsule Take 400 Units by mouth daily.    [provider]     Family History  Problem Relation Age of Onset   Breast cancer Mother 68   Heart attack Father        x3 between ages 50-64   Other Sister 57       angioplasty    Social History   Socioeconomic History   Marital status: Married    Spouse name: Not on file   Number of children: Not on file   Years of education: Not on file   Highest education level: Not on file  Occupational History   Occupation: retired    Associate Professor: [employer]    Comment: Systems analyst  Tobacco Use   Smoking status: Former    Current packs/day: 0.00    Types: Cigarettes    Start date: 1965    Quit date: 1969    Years since quitting: 56.1    Smokeless tobacco: Never  Vaping Use   Vaping status: Never Used  Substance and Sexual Activity   Alcohol use: Yes    Comment: occasional   Drug use: Never   Sexual activity: Yes    Comment: vasectomy  Other Topics Concern   Not on file  Social History Narrative   Not on file   Social Drivers of Health   Financial Resource Strain: Not on file  Food Insecurity: No Food Insecurity (06/21/2023)  Hunger Vital Sign    Worried About Running Out of Food in the Last Year: Never true    Ran Out of Food in the Last Year: Never true  Transportation Needs: No Transportation Needs (06/21/2023)   PRAPARE - Administrator, Civil Service (Medical): No    Lack of Transportation (Non-Medical): No  Physical Activity: Not on file  Stress: Not on file  Social Connections: Not on file     Review of Systems: A 12 point ROS discussed and pertinent positives are indicated in the HPI above.  All other systems are negative.  Review of Systems  Constitutional:  Negative for fatigue and fever.  HENT:  Negative for congestion and dental problem.   Respiratory:  Negative for cough, chest tightness, shortness of breath and wheezing.   Cardiovascular:  Negative for chest pain.  Gastrointestinal:  Negative for diarrhea, nausea and vomiting.  Neurological:  Negative for light-headedness and headaches.  Psychiatric/Behavioral:  Negative for agitation, behavioral problems and confusion.     Vital Signs: There were no vitals taken for this visit.  Advance Care Plan: The advanced care place/surrogate decision maker was discussed at the time of visit and the patient did not wish to discuss or was not able to name a surrogate decision maker or provide an advance care plan.  Physical Exam Constitutional:      Appearance: Normal appearance.  HENT:     Head: Normocephalic and atraumatic.     Mouth/Throat:     Mouth: Mucous membranes are moist.  Cardiovascular:     Rate and Rhythm: Normal rate and  regular rhythm.  Pulmonary:     Effort: Pulmonary effort is normal.     Breath sounds: Normal breath sounds.  Abdominal:     General: Bowel sounds are normal.     Palpations: Abdomen is soft.  Musculoskeletal:        General: Normal range of motion.     Cervical back: Normal range of motion.  Skin:    General: Skin is warm and dry.  Neurological:     Mental Status: He is alert and oriented to person, place, and time.  Psychiatric:        Mood and Affect: Mood normal.        Behavior: Behavior normal.     Imaging: No results found.  Labs:  CBC: Recent Labs    09/15/23 0905 10/07/23 0920 10/28/23 0846 12/06/23 1023  WBC 6.1 4.2 6.3 2.7*  HGB 11.7* 11.9* 11.3* 12.4*  HCT 33.9* 35.8* 33.6* 37.1*  PLT 177 214 212 160    COAGS: Recent Labs    06/24/23 1230  INR 1.0    BMP: Recent Labs    09/15/23 0905 10/07/23 0920 10/28/23 0846 12/06/23 1023  NA 137 140 139 142  K 3.9 3.8 3.9 4.0  CL 105 109 109 108  CO2 25 24 25 27   GLUCOSE 124* 93 109* 90  BUN 20 13 15 17   CALCIUM 8.9 9.5 8.8* 9.2  CREATININE 0.75 0.76 0.71 0.76  GFRNONAA >60 >60 >60 >60    LIVER FUNCTION TESTS: Recent Labs    09/15/23 0905 10/07/23 0920 10/28/23 0846 12/06/23 1023  BILITOT 0.4 0.4 0.3 0.4  AST 20 22 20 24   ALT 22 32 25 32  ALKPHOS 70 68 61 63  PROT 7.0 7.0 6.6 6.5  ALBUMIN 4.1 4.3 4.1 4.2    TUMOR MARKERS: No results for input(s): "AFPTM", "CEA", "CA199", "CHROMGRNA" in the last  8760 hours.  Assessment and Plan:  Pt with large B cell lymphoma and persistent right inguinal node mass scheduled for image guided right deep inguinal nodal mass core biopsy on 12/07/24.  Imaging was reviewed and approved by Dr. Archer Asa on 01/02/24 for image guided core biopsy of right deep inguinal nodal mass.    Risks and benefits of image guided right deep inguinal nodal mass core biopsy was discussed with the patient and/or patient's family including, but not limited to bleeding,  infection, damage to adjacent structures or low yield requiring additional tests.  All of the questions were answered and there is agreement to proceed.  Consent signed and in chart.  Thank you for allowing our service to participate in Eugene Moreno 's care.  Electronically Signed: Loman Brooklyn, PA-C   02/07/2024, 10:09 AM    I spent a total of  30 Minutes   in face to face in clinical consultation, greater than 50% of which was counseling/coordinating care for image guided right deep inguinal nodal mass core biopsy.

## 2024-02-08 ENCOUNTER — Encounter (HOSPITAL_COMMUNITY): Payer: Self-pay

## 2024-02-08 ENCOUNTER — Other Ambulatory Visit: Payer: Self-pay

## 2024-02-08 ENCOUNTER — Ambulatory Visit (HOSPITAL_COMMUNITY)
Admission: RE | Admit: 2024-02-08 | Discharge: 2024-02-08 | Disposition: A | Discharge status: Home / Self Care | Payer: Medicare Other | Source: Ambulatory Visit | Attending: Hematology and Oncology | Admitting: Hematology and Oncology

## 2024-02-08 DIAGNOSIS — Z955 Presence of coronary angioplasty implant and graft: Secondary | ICD-10-CM | POA: Diagnosis not present

## 2024-02-08 DIAGNOSIS — I10 Essential (primary) hypertension: Secondary | ICD-10-CM | POA: Diagnosis not present

## 2024-02-08 DIAGNOSIS — R1900 Intra-abdominal and pelvic swelling, mass and lump, unspecified site: Secondary | ICD-10-CM

## 2024-02-08 DIAGNOSIS — I2510 Atherosclerotic heart disease of native coronary artery without angina pectoris: Secondary | ICD-10-CM | POA: Insufficient documentation

## 2024-02-08 DIAGNOSIS — I898 Other specified noninfective disorders of lymphatic vessels and lymph nodes: Secondary | ICD-10-CM | POA: Insufficient documentation

## 2024-02-08 DIAGNOSIS — C8338 Diffuse large B-cell lymphoma, lymph nodes of multiple sites: Secondary | ICD-10-CM | POA: Insufficient documentation

## 2024-02-08 DIAGNOSIS — Z8546 Personal history of malignant neoplasm of prostate: Secondary | ICD-10-CM | POA: Diagnosis not present

## 2024-02-08 LAB — CBC
HCT: 41.1 % (ref 39.0–52.0)
Hemoglobin: 14.3 g/dL (ref 13.0–17.0)
MCH: 32.7 pg (ref 26.0–34.0)
MCHC: 34.8 g/dL (ref 30.0–36.0)
MCV: 94.1 fL (ref 80.0–100.0)
Platelets: 158 10*3/uL (ref 150–400)
RBC: 4.37 MIL/uL (ref 4.22–5.81)
RDW: 12 % (ref 11.5–15.5)
WBC: 3.1 10*3/uL — ABNORMAL LOW (ref 4.0–10.5)
nRBC: 0 % (ref 0.0–0.2)

## 2024-02-08 LAB — PROTIME-INR
INR: 1 (ref 0.8–1.2)
Prothrombin Time: 13.1 s (ref 11.4–15.2)

## 2024-02-08 MED ORDER — SODIUM CHLORIDE 0.9 % IV SOLN: INTRAVENOUS | Status: DC

## 2024-02-08 MED ORDER — LIDOCAINE HCL 1 % IJ SOLN
INTRAMUSCULAR | Status: AC
  Filled 2024-02-08: qty 20

## 2024-02-08 NOTE — Discharge Instructions (Signed)
 Discharge Instructions:   Please call Interventional Radiology clinic [phone] with any questions or concerns.  You may remove your dressing and shower tomorrow.   Needle Biopsy, Care After The following information offers guidance on how to care for yourself after your procedure. Your health care provider may also give you more specific instructions. If you have problems or questions, contact your health care provider. What can I expect after the procedure? After the procedure, it is common to have: Soreness, pain, and tenderness where a tissue sample was taken (biopsy site). Bruising or mild pain at the biopsy site. These symptoms should go away after a few days. Follow these instructions at home: Biopsy site care  Follow instructions from your health care provider about how to take care of your biopsy site. Make sure you: Wash your hands with soap and water for at least 20 seconds before and after you change your bandage (dressing). If soap and water are not available, use hand sanitizer. Know when and how to change your dressing. Know when to remove your dressing. Check your puncture site every day for signs of infection. Check for: More redness, swelling, or pain. More drainage of fluid or blood. More warmth. Pus or a bad smell. General instructions Rest as told by your health care provider. Do not take baths, swim, or use a hot tub until your health care provider approves. Ask your health care provider if you may take showers. You may only be allowed to take sponge baths. Take over-the-counter and prescription medicines only as told by your health care provider. Return to your normal activities as told by your health care provider. Ask your health care provider what activities are safe for you. If you have airplane travel scheduled, talk with your health care provider about when it is safe for you to travel by airplane. This is specific to certain biopsy procedures. It is up  to you to get the results of your procedure. Ask your health care provider, or the department that is doing the procedure, when your results will be ready. Keep all follow-up visits. You may need to make an appointment to get your biopsy results. Contact a health care provider if: You have a fever. You have more redness, swelling, or pain at the puncture site that lasts longer than a few days. You have more fluid or blood coming from your puncture site. You have pus or a bad smell coming from your puncture site. Your puncture site feels warm to the touch. You have pain that does not get better with medicine. Get help right away if: You have severe bleeding from the puncture site. You have chest pain. You have problems breathing. You cough up blood. You faint. You have a very fast heart rate. These symptoms may be an emergency. Get help right away. Call 911. Do not wait to see if the symptoms will go away. Do not drive yourself to the hospital. Summary After the procedure, it is common to have soreness, bruising, tenderness, or mild pain at the biopsy site. These symptoms should go away in a few days. Check your biopsy site every day for signs of infection, such as more redness, swelling, or pain. Do not take baths, swim, or use a hot tub until your health care provider approves. Ask your health care provider if you may take showers. Contact a heath care provider if you have more redness, swelling, or pain at the puncture site that lasts longer than a few days. This  information is not intended to replace advice given to you by your health care provider. Make sure you discuss any questions you have with your health care provider. Document Revised: 12/02/2021 Document Reviewed: 12/02/2021 Elsevier Patient Education  2023 [employer].

## 2024-02-08 NOTE — Procedures (Signed)
Interventional Radiology Procedure Note  Procedure: US Guided Biopsy of right inguinal/iliac lymph node  Complications: None  Estimated Blood Loss: < 10 mL  Findings: 18 G core biopsy of right inguinal/iliac lymph node mass performed under US guidance.  Four core samples obtained and sent to Pathology.  Jodi Marble. Fredia Sorrow, M.D Pager:  [phone]

## 2024-02-08 NOTE — Progress Notes (Signed)
1145 Reported to  Dr. Fredia Sorrow patient ate cereal and drank black coffee at 0930 this am.  Will discuss plan to use and see the patient.

## 2024-02-09 LAB — SURGICAL PATHOLOGY

## 2024-02-16 ENCOUNTER — Telehealth: Payer: Self-pay | Admitting: *Deleted

## 2024-02-16 NOTE — Telephone Encounter (Signed)
-----   Message from Ulysees Barns IV sent at 02/10/2024  8:46 AM EST ----- Please let Eugene Moreno know that his biopsy results were more consistent with inflammation than residual lymphoma.  At this time there is no evidence of active lymphoma.  We will continue monitoring in clinic.  We will see him back next week to discuss further. ----- Message ----- From: Interface, Lab In Three Zero One Sent: 02/09/2024  10:58 AM EST To: Jaci Standard, MD

## 2024-02-16 NOTE — Telephone Encounter (Signed)
 TCT patient with biopsy results. Spoke with him. Advised that his biopsy results were more consistent with inflammation than residual lymphoma.  At this time there is no evidence of active lymphoma.  We will continue monitoring in clinic.  We will see him back next week to discuss further.  He voiced understanding and is aware of his appts here tomorrow.

## 2024-02-17 ENCOUNTER — Inpatient Hospital Stay: Payer: Medicare Other | Attending: Physician Assistant

## 2024-02-17 ENCOUNTER — Other Ambulatory Visit: Payer: Self-pay | Admitting: Hematology and Oncology

## 2024-02-17 ENCOUNTER — Inpatient Hospital Stay (HOSPITAL_BASED_OUTPATIENT_CLINIC_OR_DEPARTMENT_OTHER): Payer: Medicare Other | Admitting: Hematology and Oncology

## 2024-02-17 VITALS — BP 125/77 | HR 68 | Temp 98.2°F | Resp 13 | Wt 203.8 lb

## 2024-02-17 DIAGNOSIS — R1909 Other intra-abdominal and pelvic swelling, mass and lump: Secondary | ICD-10-CM | POA: Insufficient documentation

## 2024-02-17 DIAGNOSIS — Z95828 Presence of other vascular implants and grafts: Secondary | ICD-10-CM

## 2024-02-17 DIAGNOSIS — E785 Hyperlipidemia, unspecified: Secondary | ICD-10-CM | POA: Diagnosis not present

## 2024-02-17 DIAGNOSIS — C8338 Diffuse large B-cell lymphoma, lymph nodes of multiple sites: Secondary | ICD-10-CM

## 2024-02-17 DIAGNOSIS — Z8546 Personal history of malignant neoplasm of prostate: Secondary | ICD-10-CM | POA: Insufficient documentation

## 2024-02-17 DIAGNOSIS — Z8249 Family history of ischemic heart disease and other diseases of the circulatory system: Secondary | ICD-10-CM | POA: Insufficient documentation

## 2024-02-17 DIAGNOSIS — Z803 Family history of malignant neoplasm of breast: Secondary | ICD-10-CM | POA: Insufficient documentation

## 2024-02-17 DIAGNOSIS — Z5112 Encounter for antineoplastic immunotherapy: Secondary | ICD-10-CM | POA: Diagnosis present

## 2024-02-17 DIAGNOSIS — Z79899 Other long term (current) drug therapy: Secondary | ICD-10-CM | POA: Diagnosis not present

## 2024-02-17 DIAGNOSIS — Z7901 Long term (current) use of anticoagulants: Secondary | ICD-10-CM | POA: Insufficient documentation

## 2024-02-17 DIAGNOSIS — I2510 Atherosclerotic heart disease of native coronary artery without angina pectoris: Secondary | ICD-10-CM | POA: Diagnosis not present

## 2024-02-17 DIAGNOSIS — Z86718 Personal history of other venous thrombosis and embolism: Secondary | ICD-10-CM | POA: Diagnosis not present

## 2024-02-17 DIAGNOSIS — Z87891 Personal history of nicotine dependence: Secondary | ICD-10-CM | POA: Diagnosis not present

## 2024-02-17 DIAGNOSIS — Z5111 Encounter for antineoplastic chemotherapy: Secondary | ICD-10-CM | POA: Diagnosis present

## 2024-02-17 DIAGNOSIS — K8020 Calculus of gallbladder without cholecystitis without obstruction: Secondary | ICD-10-CM | POA: Insufficient documentation

## 2024-02-17 DIAGNOSIS — I252 Old myocardial infarction: Secondary | ICD-10-CM | POA: Diagnosis not present

## 2024-02-17 DIAGNOSIS — K5730 Diverticulosis of large intestine without perforation or abscess without bleeding: Secondary | ICD-10-CM | POA: Insufficient documentation

## 2024-02-17 DIAGNOSIS — Z7982 Long term (current) use of aspirin: Secondary | ICD-10-CM | POA: Diagnosis not present

## 2024-02-17 DIAGNOSIS — C8330 Diffuse large B-cell lymphoma, unspecified site: Secondary | ICD-10-CM | POA: Insufficient documentation

## 2024-02-17 DIAGNOSIS — I700 Atherosclerosis of aorta: Secondary | ICD-10-CM | POA: Insufficient documentation

## 2024-02-17 LAB — CBC WITH DIFFERENTIAL (CANCER CENTER ONLY)
Abs Immature Granulocytes: 0 10*3/uL (ref 0.00–0.07)
Basophils Absolute: 0 10*3/uL (ref 0.0–0.1)
Basophils Relative: 1 %
Eosinophils Absolute: 0.1 10*3/uL (ref 0.0–0.5)
Eosinophils Relative: 3 %
HCT: 39.4 % (ref 39.0–52.0)
Hemoglobin: 13.8 g/dL (ref 13.0–17.0)
Immature Granulocytes: 0 %
Lymphocytes Relative: 14 %
Lymphs Abs: 0.4 10*3/uL — ABNORMAL LOW (ref 0.7–4.0)
MCH: 32.1 pg (ref 26.0–34.0)
MCHC: 35 g/dL (ref 30.0–36.0)
MCV: 91.6 fL (ref 80.0–100.0)
Monocytes Absolute: 0.6 10*3/uL (ref 0.1–1.0)
Monocytes Relative: 21 %
Neutro Abs: 1.9 10*3/uL (ref 1.7–7.7)
Neutrophils Relative %: 61 %
Platelet Count: 165 10*3/uL (ref 150–400)
RBC: 4.3 MIL/uL (ref 4.22–5.81)
RDW: 12.1 % (ref 11.5–15.5)
WBC Count: 3.1 10*3/uL — ABNORMAL LOW (ref 4.0–10.5)
nRBC: 0 % (ref 0.0–0.2)

## 2024-02-17 LAB — CMP (CANCER CENTER ONLY)
ALT: 34 U/L (ref 0–44)
AST: 28 U/L (ref 15–41)
Albumin: 4.4 g/dL (ref 3.5–5.0)
Alkaline Phosphatase: 69 U/L (ref 38–126)
Anion gap: 5 (ref 5–15)
BUN: 21 mg/dL (ref 8–23)
CO2: 27 mmol/L (ref 22–32)
Calcium: 8.9 mg/dL (ref 8.9–10.3)
Chloride: 107 mmol/L (ref 98–111)
Creatinine: 0.74 mg/dL (ref 0.61–1.24)
GFR, Estimated: >60 mL/min (ref >60)
Glucose, Bld: 88 mg/dL (ref 70–99)
Potassium: 4.2 mmol/L (ref 3.5–5.1)
Sodium: 139 mmol/L (ref 135–145)
Total Bilirubin: 0.4 mg/dL (ref 0.0–1.2)
Total Protein: 6.8 g/dL (ref 6.5–8.1)

## 2024-02-17 LAB — LACTATE DEHYDROGENASE: LDH: 174 U/L (ref 98–192)

## 2024-02-17 MED ORDER — HEPARIN SOD (PORK) LOCK FLUSH 100 UNIT/ML IV SOLN
500.0000 [IU] | Freq: Once | INTRAVENOUS | Status: AC
  Administered 2024-02-17: 500 [IU]

## 2024-02-17 MED ORDER — SODIUM CHLORIDE 0.9% FLUSH
10.0000 mL | Freq: Once | INTRAVENOUS | Status: AC
  Administered 2024-02-17: 10 mL

## 2024-02-17 NOTE — Progress Notes (Signed)
 [facility] [phone]   [phone]  PROGRESS NOTE  Patient Care Team: Orpha Bur, MD as PCP - General (Family Medicine) Quintella Reichert, MD as PCP - Cardiology (Cardiology) Crist Fat, MD as Attending Physician (Urology) Margaretmary Dys, MD as Consulting Physician (Radiation Oncology) Maryclare Labrador, RN as Registered Nurse Jaci Standard, MD as Consulting Physician (Hematology and Oncology)  Hematological/Oncological History # Diffuse Large B Cell Lymphoma, Stage III Presented to PCP with swelling and redness in his right leg x 3 weeks. 06/13/2023: Doppler US showed acute DVT involving right  common femoral vein, SF junction, right femoral vein, right proximal profunda vein, and external iliac vein. Incident finding included pelvic lesion with minimum vascularity anterior to the prostate, measuring 11.8 x 9.6 12.6 cm, with a volume of 749.3 cubic centimeters. Prior MR on 04/11/2021 reports 1.7 x .07 x 1.1 cm category 4 lesion. This lesion appears to be causing extrinsic compression on the external iliac vein (proximal external iliac vein not visualized due to extrinsic compression).  06/16/2023: CT angio abdomen/pelvis: Large lobulated mass in the right pelvis encasing the right iliac vessels and abutting the anterior pelvic wall as well as right iliacus muscle and right bladder dome. This mass consistent with a neoplastic process and features most suggestive of lymphoma.Multiple splenic lesions most consistent with metastatic disease.Short segment thick wall small bowel in the mid abdomen to the right of midline most consistent with infiltrative malignancy/lymphoma. Right pelvic sidewall mass/adenopathy.Cholelithiasis. Colonic diverticulosis. Aortic Atherosclerosis. 06/21/2023: Establish care with [facility].  06/24/2023: US guided biopsy of abdominal mass. Pathology is consistent with B-cell lymphoma with a high proliferation rate of  80-90%. Morphologically the findings are most consistent with a diffuse large B-cell lymphoma. FISH studies r/o Burkitt's lymphoma or double hit lymphoma. 07/15/2023: Cycle 1 Day 1 of R-CHOP chemotherapy.  08/05/2023: Cycle 2 Day 1 of R-CHOP chemotherapy.  08/26/2023: Cycle 3 Day 1 of R-CHOP chemotherapy.  09/15/2023: Cycle 4 Day 1 of R-CHOP chemotherapy.  10/07/2023: Cycle 5 Day 1 of R-CHOP chemotherapy.  10/28/2023: Cycle 6 Day 1 of R-CHOP chemotherapy.   Interval History:  RIPKEN REKOWSKI 78 y.o. male with medical history significant for diffuse large B-cell lymphoma who presents for a follow up visit. The patient's last visit was on 10/28/2023. In the interim, he underwent biopsy of a PET Avid area in the right inguinal region, results consistent with inflammation.   On exam today Mr. Highbaugh reports he has been well overall interim since her last visit.  He reports overall he feels quite good.  His energy is strong and his appetite is good.  He reports he is had no issues with fevers, chills, sweats.  He reports no nausea, vomiting, or diarrhea.  He denies any bumps or lumps concerning for recurrent lymphadenopathy.  He and his wife have upcoming travel plans to [place] as well as [place] and [place].  Otherwise he is at his baseline level of health. Overall he is willing and able to proceed with chemotherapy today.  A full 10 point ROS is otherwise negative.  Today we discussed the results of his biopsy.  The biopsy findings were consistent with inflammation and no clear evidence of residual or recurrent lymphoma.  Radiologist review the imaging also did not believe that there was residual lymphoma but was willing to take a biopsy from the area for our purposes.  Given this I would recommend we follow-up with routine CT scans as previously  planned.  The patient and his wife voiced understanding of our findings and plan moving forward.  MEDICAL HISTORY:  Past Medical History:  Diagnosis Date    Coronary artery disease    cardiologist--- dr t. turner;  hx AWMI 09-06-2001  s/p cath with PCI and DES x1 to prox LAD;  last nuclear study 05-10-2016 in epic, anterior scar without ishcemia, nuclear ef 44%)   History of MI (myocardial infarction) 09/06/2001   anterior wall   Hyperlipemia    Hypertension    Leukopenia    chronic mild   Non Hodgkin's lymphoma (HCC)    Prostate cancer [facility])    urologist-- dr Marlou Porch--- dx 05/ 2022, Gleason 4+3, PSA 8.04   S/P drug eluting coronary stent placement 09/06/2001   x1 to prox LAD   Wears glasses    Wears hearing aid in both ears     SURGICAL HISTORY: Past Surgical History:  Procedure Laterality Date   ANTERIOR CERVICAL DECOMP/DISCECTOMY FUSION  01/26/2002   @[facility]  by dr Jeral Fruit;   C6-C7   COLONOSCOPY  2020   CORONARY ANGIOPLASTY WITH STENT PLACEMENT  09/06/2001   @[facility]  by dr s. Maylon Cos;   PCI and DES x1 to prox LAD   IR IMAGING GUIDED PORT INSERTION  07/11/2023   RADIOACTIVE SEED IMPLANT N/A 09/03/2021   Procedure: RADIOACTIVE SEED IMPLANT/BRACHYTHERAPY IMPLANT;  Surgeon: Crist Fat, MD;  Location: 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   SPACE OAR INSTILLATION N/A 09/03/2021   Procedure: SPACE OAR INSTILLATION;  Surgeon: Crist Fat, MD;  Location: [facility];  Service: Urology;  Laterality: N/A;   WISDOM TOOTH EXTRACTION      SOCIAL HISTORY: Social History   Socioeconomic History   Marital status: Married    Spouse name: Not on file   Number of children: Not on file   Years of education: Not on file   Highest education level: Not on file  Occupational History   Occupation: retired    Associate Professor: [employer]    Comment: Systems analyst  Tobacco Use   Smoking status: Former    Current packs/day: 0.00    Types: Cigarettes    Start date: 1965    Quit date: 1969    Years since  quitting: 56.1   Smokeless tobacco: Never  Vaping Use   Vaping status: Never Used  Substance and Sexual Activity   Alcohol use: Yes    Comment: occasional   Drug use: Never   Sexual activity: Yes    Comment: vasectomy  Other Topics Concern   Not on file  Social History Narrative   Not on file   Social Drivers of Health   Financial Resource Strain: Not on file  Food Insecurity: No Food Insecurity (06/21/2023)   Hunger Vital Sign    Worried About Running Out of Food in the Last Year: Never true    Ran Out of Food in the Last Year: Never true  Transportation Needs: No Transportation Needs (06/21/2023)   PRAPARE - Administrator, Civil Service (Medical): No    Lack of Transportation (Non-Medical): No  Physical Activity: Not on file  Stress: Not on file  Social Connections: Not on file  Intimate Partner Violence: Not At Risk (06/21/2023)   Humiliation, Afraid, Rape, and Kick questionnaire    Fear of Current or Ex-Partner: No    Emotionally  Abused: No    Physically Abused: No    Sexually Abused: No    FAMILY HISTORY: Family History  Problem Relation Age of Onset   Breast cancer Mother 34   Heart attack Father        x3 between ages 77-64   Other Sister 53       angioplasty    ALLERGIES:  is allergic to niacin and related.  MEDICATIONS:  Current Outpatient Medications  Medication Sig Dispense Refill   allopurinol (ZYLOPRIM) 300 MG tablet Take 1 tablet (300 mg total) by mouth daily. 90 tablet 1   apixaban (ELIQUIS) 5 MG TABS tablet Take 1 tablet (5 mg total) by mouth 2 (two) times daily. Start taking after completion of starter pack. 60 tablet 5   aspirin EC 81 MG tablet Take 2 tablets (162 mg total) by mouth daily.     ciprofloxacin (CIPRO) 500 MG tablet Take 1 tablet (500 mg total) by mouth 2 (two) times daily. 28 tablet 0   Cod Liver Oil CAPS Take 2 capsules by mouth daily.     ferrous fumarate (HEMOCYTE - 106 MG FE) 325 (106 FE) MG TABS Take 1 tablet by mouth  daily.     fluticasone (FLONASE) 50 MCG/ACT nasal spray Place 1 spray into both nostrils 2 (two) times daily.     folic acid (FOLVITE) 800 MCG tablet Take 800 mcg by mouth daily.      HYDROcodone-acetaminophen (NORCO/VICODIN) 5-325 MG tablet Take 1 tablet by mouth every 6 (six) hours as needed. 60 tablet 0   hydrocortisone 2.5 % cream APPLY 500 MG TOPICALLY 2 (TWO) TIMES DAILY AS NEEDED. 28 g 2   icosapent Ethyl (VASCEPA) 1 g capsule Take 2 capsules (2 g total) by mouth 2 (two) times daily. 120 capsule 8   lidocaine-prilocaine (EMLA) cream Apply 1 Application topically as needed. 30 g 0   loratadine (CLARITIN) 10 MG tablet Take 10 mg by mouth daily.     ondansetron (ZOFRAN) 8 MG tablet Take 1 tablet (8 mg total) by mouth every 8 (eight) hours as needed. (Patient not taking: Reported on 08/26/2023) 30 tablet 0   predniSONE (DELTASONE) 20 MG tablet Take 3 tablets (60 mg total) by mouth daily with breakfast. 15 tablet 5   prochlorperazine (COMPAZINE) 10 MG tablet Take 1 tablet (10 mg total) by mouth every 6 (six) hours as needed for nausea or vomiting. (Patient not taking: Reported on 08/26/2023) 30 tablet 0   ramipril (ALTACE) 2.5 MG capsule TAKE 1 CAPSULE BY MOUTH EVERY DAY 90 capsule 3   rosuvastatin (CRESTOR) 10 MG tablet TAKE 1 TABLET BY MOUTH EVERY DAY 90 tablet 3   vitamin C (ASCORBIC ACID) 500 MG tablet Take 1,000 mg by mouth daily.      vitamin E 400 UNIT capsule Take 400 Units by mouth daily.     No current facility-administered medications for this visit.    REVIEW OF SYSTEMS:   Constitutional: ( - ) fevers, ( - )  chills , ( - ) night sweats Eyes: ( - ) blurriness of vision, ( - ) double vision, ( - ) watery eyes Ears, nose, mouth, throat, and face: ( - ) mucositis, ( - ) sore throat Respiratory: ( - ) cough, ( - ) dyspnea, ( - ) wheezes Cardiovascular: ( - ) palpitation, ( - ) chest discomfort, ( - ) lower extremity swelling Gastrointestinal:  ( - ) nausea, ( - ) heartburn, ( - )  change in  bowel habits Skin: ( - ) abnormal skin rashes Lymphatics: ( - ) new lymphadenopathy, ( - ) easy bruising Neurological: ( - ) numbness, ( - ) tingling, ( - ) new weaknesses Behavioral/Psych: ( - ) mood change, ( - ) new changes  All other systems were reviewed with the patient and are negative.  PHYSICAL EXAMINATION: ECOG PERFORMANCE STATUS: 1 - Symptomatic but completely ambulatory  Vitals:   02/17/24 1104  BP: 125/77  Pulse: 68  Resp: 13  Temp: 98.2 F (36.8 C)  SpO2: 97%   Filed Weights   02/17/24 1104  Weight: 203 lb 12.8 oz (92.4 kg)    GENERAL: Well-appearing elderly Caucasian male, alert, no distress and comfortable SKIN: skin color, texture, turgor are normal, significant lesions. Mild erythematous rash on chest and forehead, chest, and arms EYES: conjunctiva are pink and non-injected, sclera clear LUNGS: clear to auscultation and percussion with normal breathing effort HEART: regular rate & rhythm and no murmurs and no lower extremity edema Musculoskeletal: no cyanosis of digits and no clubbing  PSYCH: alert & oriented x 3, fluent speech NEURO: no focal motor/sensory deficits  LABORATORY DATA:  I have reviewed the data as listed    Latest Ref Rng & Units 02/17/2024   10:28 AM 02/08/2024   11:19 AM 12/06/2023   10:23 AM  CBC  WBC 4.0 - 10.5 K/uL 3.1  3.1  2.7   Hemoglobin 13.0 - 17.0 g/dL 13.0  86.5  78.4   Hematocrit 39.0 - 52.0 % 39.4  41.1  37.1   Platelets 150 - 400 K/uL 165  158  160        Latest Ref Rng & Units 02/17/2024   10:28 AM 12/06/2023   10:23 AM 10/28/2023    8:46 AM  CMP  Glucose 70 - 99 mg/dL 88  90  696   BUN 8 - 23 mg/dL 21  17  15    Creatinine 0.61 - 1.24 mg/dL 2.95  2.84  1.32   Sodium 135 - 145 mmol/L 139  142  139   Potassium 3.5 - 5.1 mmol/L 4.2  4.0  3.9   Chloride 98 - 111 mmol/L 107  108  109   CO2 22 - 32 mmol/L 27  27  25    Calcium 8.9 - 10.3 mg/dL 8.9  9.2  8.8   Total Protein 6.5 - 8.1 g/dL 6.8  6.5  6.6    Total Bilirubin 0.0 - 1.2 mg/dL 0.4  0.4  0.3   Alkaline Phos 38 - 126 U/L 69  63  61   AST 15 - 41 U/L 28  24  20    ALT 0 - 44 U/L 34  32  25     Lab Results  Component Value Date   MPROTEIN Not Observed 11/05/2016   Lab Results  Component Value Date   KPAFRELGTCHN 2.46 (H) 11/03/2015   KPAFRELGTCHN 2.86 (H) 09/16/2011   KPAFRELGTCHN 2.86 (H) 09/16/2011   LAMBDASER 1.71 11/03/2015   LAMBDASER 2.65 (H) 09/16/2011   LAMBDASER 2.65 (H) 09/16/2011   KAPLAMBRATIO 1.25 11/05/2016   KAPLAMBRATIO 1.44 11/03/2015   KAPLAMBRATIO 1.08 09/16/2011   KAPLAMBRATIO 1.08 09/16/2011    RADIOGRAPHIC STUDIES: [place] CORE BIOPSY (LYMPH NODES) Result Date: 02/08/2024 INDICATION: History of diffuse large B-cell lymphoma with persistent enlarged and hypermetabolic lymph node mass in the right inguinal region. Biopsy requested to evaluate residual lymphoma. EXAM: ULTRASOUND GUIDED CORE BIOPSY OF RIGHT INGUINAL LYMPH NODE MASS MEDICATIONS: None. ANESTHESIA/SEDATION: None PROCEDURE:  The procedure, risks, benefits, and alternatives were explained to the patient. Questions regarding the procedure were encouraged and answered. The patient understands and consents to the procedure. A time-out was performed prior to initiating the procedure. The right groin region was prepped with chlorhexidine in a sterile fashion, and a sterile drape was applied covering the operative field. A sterile gown and sterile gloves were used for the procedure. Local anesthesia was provided with 1% Lidocaine. After localizing a right inguinal/distal external iliac lymph node mass, a 17 gauge trocar needle was advanced to the periphery of the mass. Four separate coaxial 18 gauge core biopsy samples were then obtained and submitted in saline. Additional ultrasound was performed. COMPLICATIONS: None immediate. FINDINGS: Irregular hypoechoic lymph node mass in the high right inguinal/distal external iliac region abutting the external iliac artery  and vein measures approximately 4.0 x 3.1 x 2.8 cm by ultrasound. Solid core biopsy samples were obtained. IMPRESSION: Ultrasound-guided core biopsy performed of a lymph node mass in the high right inguinal/distal external iliac region. Electronically Signed   By: Irish Lack M.D.   On: 02/08/2024 14:21    ASSESSMENT & PLAN JUNIEL GROENE is a 78 y.o.. male with medical history significant for diffuse large B-cell lymphoma who presents for a follow up visit.   #Diffuse Large B-cell Lymphoma, Stage III --Underwent US guided biopsy of pelvic mass on 06/24/2023.  Pathology is consistent with B-cell lymphoma with a high proliferation rate of 80-90%. Morphologically the findings are most consistent with a diffuse large B-cell lymphoma. FISH studies r/o Burkitt's lymphoma or double hit lymphoma. --Pre treatment PET CT scan on 07/06/2023 showed right external iliac nodal chain measuring 13.9 x 12.1 cm with an SUV max of 34.34. Multiple tracer avid lymph nodes are identified within the chest, abdomen and pelvis compatible with lymphoma. Consistent with Stage III disease.  PLAN: --completed 6 cycles of R-CHOP chemotherapy. --Labs today show white blood cell labs today show white blood cell count 3.1, hemoglobin 13.8, MCV 91.6, platelet 165 with normal creatinine and LFTs. --interval PET CT on 09/09/2023 showed areas of concern including 2 new sites of tracer avid peritoneal soft tissue along the undersurface of the right lower quadrant.  This measured Deauville criteria 5.  This was discussed with IR who noted the lesions were too small for biopsy and recommended continued observation.  There is also decreased intensity of the right external iliac nodes with the area measuring Deauville criteria 4 --post treatment PET CT scan showed positive response to therapy with regression of the previously noted soft tissue mass along the undersurface of the right anterior abdominal wall musculature, and resolution of  previously noted mesenteric lymphadenopathy and hypermetabolism. However, there is a persistent nodal mass in the right inguinal region which has slightly decreased in size but maintains peripheral predominant hypermetabolism (Deauville 4) -- Biopsy of concerning area showed inflammation but no evidence of underlying malignancy.  Will plan to pursue routine CT scans moving forward. --RTC in 16 weeks for labs, clinic visit and  CT scan.   #Rash: --Possibly related to antibiotic therapy --Patient is currently taking claritin daily. We will monitor his rash while he takes his prednisone over the next few days.  -- Prescribed hydrocortisone 2.5% cream sent to his pharmacy.  #Right groin pain: --Pain has improved and well controlled with Norco 5-325 mg BID as needed.  #H/O DVT involving right  --Diagnosed 06/12/2022, secondary to underlying lymphoma --Currently on Eliquis 5 mg twice daily -- Patient completed greater  than 6 months of full-strength anticoagulation therapy and his symptoms have resolved.  The provoking factor was thought to be the lymphadenopathy from his lymphoma. -- Okay to discontinue Eliquis at this time.  #Supportive Care -- port placed.  Request for removal placed today. -- no pain medication required at this time.   Orders Placed This Encounter  Procedures   IR Removal Tun Access W/ Port W/O FL    Standing Status:   Future    Expected Date:   02/24/2024    Expiration Date:   02/16/2025    Reason for exam::   chemo complete, patient in remission. Requesting port removal    Preferred Imaging Location?:   [facility]   CT CHEST ABDOMEN PELVIS W CONTRAST    Standing Status:   Future    Expected Date:   06/04/2024    Expiration Date:   02/16/2025    If indicated for the ordered procedure, I authorize the administration of contrast media per Radiology protocol:   Yes    Does the patient have a contrast media/X-ray dye allergy?:   No    Preferred imaging location?:    [facility]    If indicated for the ordered procedure, I authorize the administration of oral contrast media per Radiology protocol:   Yes   All questions were answered. The patient knows to call the clinic with any problems, questions or concerns.  I have spent a total of 30 minutes minutes of face-to-face and non-face-to-face time, preparing to see the patient,performing a medically appropriate examination, counseling and educating the patient, ordering tests/procedures, documenting clinical information in the electronic health record, independently interpreting results and communicating results to the patient, and care coordination.   Ulysees Barns, MD Department of Hematology/Oncology [facility] Phone: [phone] Pager: [phone] Email: [email or web address]  02/17/2024 4:19 PM

## 2024-02-18 ENCOUNTER — Other Ambulatory Visit: Payer: Self-pay

## 2024-02-24 ENCOUNTER — Ambulatory Visit (HOSPITAL_COMMUNITY)
Admission: RE | Admit: 2024-02-24 | Discharge: 2024-02-24 | Disposition: A | Discharge status: Home / Self Care | Source: Ambulatory Visit | Attending: Hematology and Oncology | Admitting: Hematology and Oncology

## 2024-02-24 ENCOUNTER — Encounter: Payer: Self-pay | Admitting: Hematology and Oncology

## 2024-02-24 DIAGNOSIS — Z452 Encounter for adjustment and management of vascular access device: Secondary | ICD-10-CM | POA: Diagnosis present

## 2024-02-24 DIAGNOSIS — Z95828 Presence of other vascular implants and grafts: Secondary | ICD-10-CM

## 2024-02-24 HISTORY — PX: IR REMOVAL TUN ACCESS W/ PORT W/O FL MOD SED: IMG2290

## 2024-02-24 MED ORDER — LIDOCAINE-EPINEPHRINE 1 %-1:100000 IJ SOLN
INTRAMUSCULAR | Status: AC
Start: 2024-02-24 — End: ?
  Filled 2024-02-24: qty 1

## 2024-03-04 ENCOUNTER — Other Ambulatory Visit: Payer: Self-pay | Admitting: Physician Assistant

## 2024-03-15 ENCOUNTER — Other Ambulatory Visit: Payer: Self-pay

## 2024-03-17 ENCOUNTER — Other Ambulatory Visit: Payer: Self-pay

## 2024-05-02 ENCOUNTER — Other Ambulatory Visit: Payer: Self-pay | Admitting: Cardiology

## 2024-05-07 ENCOUNTER — Ambulatory Visit: Admitting: Cardiology

## 2024-05-23 ENCOUNTER — Other Ambulatory Visit: Payer: Self-pay | Admitting: Cardiology

## 2024-05-28 ENCOUNTER — Telehealth: Payer: Self-pay | Admitting: Hematology and Oncology

## 2024-05-30 ENCOUNTER — Inpatient Hospital Stay

## 2024-05-30 ENCOUNTER — Inpatient Hospital Stay: Admitting: Hematology and Oncology

## 2024-06-04 ENCOUNTER — Ambulatory Visit (HOSPITAL_COMMUNITY)
Admission: RE | Admit: 2024-06-04 | Discharge: 2024-06-04 | Disposition: A | Discharge status: Home / Self Care | Source: Ambulatory Visit | Attending: Hematology and Oncology | Admitting: Hematology and Oncology

## 2024-06-04 DIAGNOSIS — C8338 Diffuse large B-cell lymphoma, lymph nodes of multiple sites: Secondary | ICD-10-CM | POA: Diagnosis present

## 2024-06-04 MED ORDER — IOHEXOL 9 MG/ML PO SOLN
1000.0000 mL | Freq: Once | ORAL | Status: AC
  Administered 2024-06-04: 1000 mL via ORAL

## 2024-06-04 MED ORDER — IOHEXOL 9 MG/ML PO SOLN
ORAL | Status: AC
  Filled 2024-06-04: qty 1000

## 2024-06-04 MED ORDER — IOHEXOL 300 MG/ML  SOLN
100.0000 mL | Freq: Once | INTRAMUSCULAR | Status: AC | PRN
  Administered 2024-06-04: 100 mL via INTRAVENOUS

## 2024-06-05 NOTE — Progress Notes (Signed)
 Contacted pt per Dr Rosaline Coma to : let Eugene Moreno know that his CT scan looks excellent. There is no evidence of worsening lymphadenopathy. The area of inflammation previously noted has shrunk considerably. Overall his results are most consistent with a continued complete remission. We will plan to see him back as scheduled next week.  Pt acknowledged information and verbalized understanding.

## 2024-06-07 ENCOUNTER — Ambulatory Visit: Payer: Medicare Other | Admitting: Hematology and Oncology

## 2024-06-07 ENCOUNTER — Other Ambulatory Visit: Payer: Medicare Other

## 2024-06-10 ENCOUNTER — Other Ambulatory Visit: Payer: Self-pay | Admitting: Hematology and Oncology

## 2024-06-13 ENCOUNTER — Other Ambulatory Visit: Payer: Self-pay | Admitting: Hematology and Oncology

## 2024-06-13 ENCOUNTER — Inpatient Hospital Stay (HOSPITAL_BASED_OUTPATIENT_CLINIC_OR_DEPARTMENT_OTHER): Admitting: Hematology and Oncology

## 2024-06-13 ENCOUNTER — Inpatient Hospital Stay: Attending: Hematology and Oncology

## 2024-06-13 VITALS — BP 113/78 | HR 73 | Temp 98.0°F | Resp 14 | Wt 206.2 lb

## 2024-06-13 DIAGNOSIS — C8338 Diffuse large B-cell lymphoma, lymph nodes of multiple sites: Secondary | ICD-10-CM

## 2024-06-13 DIAGNOSIS — I700 Atherosclerosis of aorta: Secondary | ICD-10-CM | POA: Insufficient documentation

## 2024-06-13 DIAGNOSIS — Z87891 Personal history of nicotine dependence: Secondary | ICD-10-CM | POA: Insufficient documentation

## 2024-06-13 DIAGNOSIS — C8330 Diffuse large B-cell lymphoma, unspecified site: Secondary | ICD-10-CM | POA: Insufficient documentation

## 2024-06-13 DIAGNOSIS — Z7901 Long term (current) use of anticoagulants: Secondary | ICD-10-CM | POA: Diagnosis not present

## 2024-06-13 DIAGNOSIS — M7989 Other specified soft tissue disorders: Secondary | ICD-10-CM | POA: Diagnosis not present

## 2024-06-13 DIAGNOSIS — I252 Old myocardial infarction: Secondary | ICD-10-CM | POA: Diagnosis not present

## 2024-06-13 DIAGNOSIS — K8020 Calculus of gallbladder without cholecystitis without obstruction: Secondary | ICD-10-CM | POA: Insufficient documentation

## 2024-06-13 DIAGNOSIS — Z86718 Personal history of other venous thrombosis and embolism: Secondary | ICD-10-CM | POA: Diagnosis not present

## 2024-06-13 DIAGNOSIS — I2510 Atherosclerotic heart disease of native coronary artery without angina pectoris: Secondary | ICD-10-CM | POA: Insufficient documentation

## 2024-06-13 DIAGNOSIS — Z803 Family history of malignant neoplasm of breast: Secondary | ICD-10-CM | POA: Diagnosis not present

## 2024-06-13 DIAGNOSIS — Z8546 Personal history of malignant neoplasm of prostate: Secondary | ICD-10-CM | POA: Diagnosis not present

## 2024-06-13 DIAGNOSIS — Z79899 Other long term (current) drug therapy: Secondary | ICD-10-CM | POA: Diagnosis not present

## 2024-06-13 DIAGNOSIS — Z8249 Family history of ischemic heart disease and other diseases of the circulatory system: Secondary | ICD-10-CM | POA: Insufficient documentation

## 2024-06-13 DIAGNOSIS — K5730 Diverticulosis of large intestine without perforation or abscess without bleeding: Secondary | ICD-10-CM | POA: Insufficient documentation

## 2024-06-13 DIAGNOSIS — Z7982 Long term (current) use of aspirin: Secondary | ICD-10-CM | POA: Diagnosis not present

## 2024-06-13 LAB — CBC WITH DIFFERENTIAL (CANCER CENTER ONLY)
Abs Immature Granulocytes: 0 10*3/uL (ref 0.00–0.07)
Basophils Absolute: 0 10*3/uL (ref 0.0–0.1)
Basophils Relative: 0 %
Eosinophils Absolute: 0.1 10*3/uL (ref 0.0–0.5)
Eosinophils Relative: 4 %
HCT: 41.7 % (ref 39.0–52.0)
Hemoglobin: 14.5 g/dL (ref 13.0–17.0)
Immature Granulocytes: 0 %
Lymphocytes Relative: 14 %
Lymphs Abs: 0.4 10*3/uL — ABNORMAL LOW (ref 0.7–4.0)
MCH: 32.6 pg (ref 26.0–34.0)
MCHC: 34.8 g/dL (ref 30.0–36.0)
MCV: 93.7 fL (ref 80.0–100.0)
Monocytes Absolute: 0.6 10*3/uL (ref 0.1–1.0)
Monocytes Relative: 18 %
Neutro Abs: 1.9 10*3/uL (ref 1.7–7.7)
Neutrophils Relative %: 64 %
Platelet Count: 152 10*3/uL (ref 150–400)
RBC: 4.45 MIL/uL (ref 4.22–5.81)
RDW: 12.6 % (ref 11.5–15.5)
WBC Count: 3 10*3/uL — ABNORMAL LOW (ref 4.0–10.5)
nRBC: 0 % (ref 0.0–0.2)

## 2024-06-13 LAB — CMP (CANCER CENTER ONLY)
ALT: 60 U/L — ABNORMAL HIGH (ref 0–44)
AST: 52 U/L — ABNORMAL HIGH (ref 15–41)
Albumin: 4.4 g/dL (ref 3.5–5.0)
Alkaline Phosphatase: 58 U/L (ref 38–126)
Anion gap: 9 (ref 5–15)
BUN: 18 mg/dL (ref 8–23)
CO2: 24 mmol/L (ref 22–32)
Calcium: 9.3 mg/dL (ref 8.9–10.3)
Chloride: 109 mmol/L (ref 98–111)
Creatinine: 0.86 mg/dL (ref 0.61–1.24)
GFR, Estimated: >60 mL/min (ref >60)
Glucose, Bld: 91 mg/dL (ref 70–99)
Potassium: 4.1 mmol/L (ref 3.5–5.1)
Sodium: 142 mmol/L (ref 135–145)
Total Bilirubin: 0.5 mg/dL (ref 0.0–1.2)
Total Protein: 6.9 g/dL (ref 6.5–8.1)

## 2024-06-13 LAB — LACTATE DEHYDROGENASE: LDH: 274 U/L — ABNORMAL HIGH (ref 98–192)

## 2024-06-13 NOTE — Progress Notes (Signed)
 [facility] [phone]   [phone]  PROGRESS NOTE  Patient Care Team: Eugene Artist PARAS, MD as PCP - General (Family Medicine) Eugene Wilbert SAUNDERS, MD as PCP - Cardiology (Cardiology) Eugene Morene ORN, MD as Attending Physician (Urology) Eugene Cough, MD as Consulting Physician (Radiation Oncology) Eugene Wendelyn BIRCH, RN as Registered Nurse Eugene Norleen ONEIDA MADISON, MD as Consulting Physician (Hematology and Oncology)  Hematological/Oncological History # Diffuse Large B Cell Lymphoma, Stage III Presented to PCP with swelling and redness in his right leg x 3 weeks. 06/13/2023: Doppler US  showed acute DVT involving right  common femoral vein, SF junction, right femoral vein, right proximal profunda vein, and external iliac vein. Incident finding included pelvic lesion with minimum vascularity anterior to the prostate, measuring 11.8 x 9.6 12.6 cm, with a volume of 749.3 cubic centimeters. Prior MR on 04/11/2021 reports 1.7 x .07 x 1.1 cm category 4 lesion. This lesion appears to be causing extrinsic compression on the external iliac vein (proximal external iliac vein not visualized due to extrinsic compression).  06/16/2023: CT angio abdomen/pelvis: Large lobulated mass in the right pelvis encasing the right iliac vessels and abutting the anterior pelvic wall as well as right iliacus muscle and right bladder dome. This mass consistent with a neoplastic process and features most suggestive of lymphoma.Multiple splenic lesions most consistent with metastatic disease.Short segment thick wall small bowel in the mid abdomen to the right of midline most consistent with infiltrative malignancy/lymphoma. Right pelvic sidewall mass/adenopathy.Cholelithiasis. Colonic diverticulosis. Aortic Atherosclerosis. 06/21/2023: Establish care with [facility].  06/24/2023: US  guided biopsy of abdominal mass. Pathology is consistent with B-cell lymphoma with a high proliferation rate of  80-90%. Morphologically the findings are most consistent with a diffuse large B-cell lymphoma. FISH studies r/o Burkitt's lymphoma or double hit lymphoma. 07/15/2023: Cycle 1 Day 1 of R-CHOP chemotherapy.  08/05/2023: Cycle 2 Day 1 of R-CHOP chemotherapy.  08/26/2023: Cycle 3 Day 1 of R-CHOP chemotherapy.  09/15/2023: Cycle 4 Day 1 of R-CHOP chemotherapy.  10/07/2023: Cycle 5 Day 1 of R-CHOP chemotherapy.  10/28/2023: Cycle 6 Day 1 of R-CHOP chemotherapy.   Interval History:  Eugene Moreno 78 y.o. male with medical history significant for diffuse large B-cell lymphoma who presents for a follow up visit. The patient's last visit was on 02/17/2024. In the interim, he underwent biopsy of a repeat CT scan which showed continued decrease in the size of the inflamed area noted previously on PET CT scan.   On exam today Eugene Moreno reports he has been doing well overall in interim since her last visit.  He has upcoming drive to [place] , where he will be stopping in [place] .  He reports his energy and appetite are good.  He notes he would like to lose 20 pounds.  He notes that he has gained 3 pounds in the interim since her last visit.  He notes he is not having any pain anywhere denies any infectious symptoms such as runny nose, sore throat, Moreno.  He does have some upcoming dental work but there is an hesitancy over his chronically low white blood cell count.  He reports that the reason for his LFTs being elevated was he had a late night party last night with some beer and pizza.  He had 2 beers.  Overall he is feeling well and has no questions concerns or complaints today.   A full 10 point ROS is otherwise negative.  MEDICAL HISTORY:  Past Medical History:  Diagnosis Date   Coronary artery disease    cardiologist--- dr t. turner;  hx AWMI 09-06-2001  s/p cath with PCI and DES x1 to prox LAD;  last nuclear study 05-10-2016 in epic, anterior scar without ishcemia, nuclear ef 44%)   History  of MI (myocardial infarction) 09/06/2001   anterior wall   Hyperlipemia    Hypertension    Leukopenia    chronic mild   Non Hodgkin's lymphoma (HCC)    Prostate cancer [facility])    urologist-- dr Eugene--- dx 05/ 2022, Gleason 4+3, PSA 8.04   S/P drug eluting coronary stent placement 09/06/2001   x1 to prox LAD   Wears glasses    Wears hearing aid in both ears     SURGICAL HISTORY: Past Surgical History:  Procedure Laterality Date   ANTERIOR CERVICAL DECOMP/DISCECTOMY FUSION  01/26/2002   @[facility]  by dr botero;   C6-C7   COLONOSCOPY  2020   CORONARY ANGIOPLASTY WITH STENT PLACEMENT  09/06/2001   @[facility]  by dr s. 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   SPACE OAR INSTILLATION N/A 09/03/2021   Procedure: SPACE OAR INSTILLATION;  Surgeon: Eugene Morene ORN, MD;  Location: [facility];  Service: Urology;  Laterality: N/A;   WISDOM TOOTH EXTRACTION      SOCIAL HISTORY: Social History   Socioeconomic History   Marital status: Married    Spouse name: Not on file   Number of children: Not on file   Years of education: Not on file   Highest education level: Not on file  Occupational History   Occupation: retired    Associate Professor: [employer]    Comment: Systems analyst  Tobacco Use   Smoking status: Former    Current packs/day: 0.00    Types: Cigarettes    Start date: 1965    Quit date: 1969    Years since quitting: 56.5   Smokeless tobacco: Never  Vaping Use   Vaping status: Never Used  Substance and Sexual Activity   Alcohol use: Yes    Comment: occasional   Drug  use: Never   Sexual activity: Yes    Comment: vasectomy  Other Topics Concern   Not on file  Social History Narrative   Not on file   Social Drivers of Health   Financial Resource Strain: Not on file  Food Insecurity: No Food Insecurity (06/21/2023)   Hunger Vital Sign    Worried About Running Out of Food in the Last Year: Never true    Ran Out of Food in the Last Year: Never true  Transportation Needs: No Transportation Needs (06/21/2023)   PRAPARE - Administrator, Civil Service (Medical): No    Lack of Transportation (Non-Medical): No  Physical Activity: Not on file  Stress: Not on file  Social Connections: Not on file  Intimate Partner Violence: Not At Risk (06/21/2023)   Humiliation, Afraid, Rape, and Kick questionnaire    Fear of Current or Ex-Partner: No    Emotionally Abused: No    Physically Abused: No  Sexually Abused: No    FAMILY HISTORY: Family History  Problem Relation Age of Onset   Breast cancer Mother 53   Heart attack Father        x3 between ages 35-64   Other Sister 56       angioplasty    ALLERGIES:  is allergic to niacin and related.  MEDICATIONS:  Current Outpatient Medications  Medication Sig Dispense Refill   allopurinol  (ZYLOPRIM ) 300 MG tablet Take 1 tablet (300 mg total) by mouth daily. 90 tablet 1   apixaban  (ELIQUIS ) 5 MG TABS tablet Take 1 tablet (5 mg total) by mouth 2 (two) times daily. Start taking after completion of starter pack. 60 tablet 5   aspirin EC 81 MG tablet Take 2 tablets (162 mg total) by mouth daily.     ciprofloxacin  (CIPRO ) 500 MG tablet Take 1 tablet (500 mg total) by mouth 2 (two) times daily. 28 tablet 0   Cod Liver Oil CAPS Take 2 capsules by mouth daily.     ferrous fumarate (HEMOCYTE - 106 MG FE) 325 (106 FE) MG TABS Take 1 tablet by mouth daily.     fluticasone (FLONASE) 50 MCG/ACT nasal spray Place 1 spray into both nostrils 2 (two) times daily.     folic acid (FOLVITE) 800 MCG tablet Take 800 mcg by  mouth daily.      HYDROcodone -acetaminophen  (NORCO/VICODIN) 5-325 MG tablet Take 1 tablet by mouth every 6 (six) hours as needed. 60 tablet 0   hydrocortisone  2.5 % cream APPLY 500 MG TOPICALLY 2 (TWO) TIMES DAILY AS NEEDED. 28 g 2   icosapent  Ethyl (VASCEPA ) 1 g capsule Take 1 capsule (1 g total) by mouth 2 (two) times daily. 180 capsule 0   lidocaine -prilocaine  (EMLA ) cream Apply 1 Application topically as needed. 30 g 0   loratadine (CLARITIN) 10 MG tablet Take 10 mg by mouth daily.     ondansetron  (ZOFRAN ) 8 MG tablet Take 1 tablet (8 mg total) by mouth every 8 (eight) hours as needed. (Patient not taking: Reported on 08/26/2023) 30 tablet 0   predniSONE  (DELTASONE ) 20 MG tablet Take 3 tablets (60 mg total) by mouth daily with breakfast. 15 tablet 5   prochlorperazine  (COMPAZINE ) 10 MG tablet Take 1 tablet (10 mg total) by mouth every 6 (six) hours as needed for nausea or vomiting. (Patient not taking: Reported on 08/26/2023) 30 tablet 0   ramipril  (ALTACE ) 2.5 MG capsule TAKE 1 CAPSULE BY MOUTH EVERY DAY 90 capsule 0   rosuvastatin  (CRESTOR ) 10 MG tablet TAKE 1 TABLET BY MOUTH EVERY DAY 90 tablet 0   vitamin C (ASCORBIC ACID) 500 MG tablet Take 1,000 mg by mouth daily.      vitamin E 400 UNIT capsule Take 400 Units by mouth daily.     No current facility-administered medications for this visit.    REVIEW OF SYSTEMS:   Constitutional: ( - ) fevers, ( - )  chills , ( - ) night sweats Eyes: ( - ) blurriness of vision, ( - ) double vision, ( - ) watery eyes Ears, nose, mouth, throat, and face: ( - ) mucositis, ( - ) sore throat Respiratory: ( - ) Moreno, ( - ) dyspnea, ( - ) wheezes Cardiovascular: ( - ) palpitation, ( - ) chest discomfort, ( - ) lower extremity swelling Gastrointestinal:  ( - ) nausea, ( - ) heartburn, ( - ) change in bowel habits Skin: ( - ) abnormal skin rashes Lymphatics: ( - )  new lymphadenopathy, ( - ) easy bruising Neurological: ( - ) numbness, ( - ) tingling, ( - ) new  weaknesses Behavioral/Psych: ( - ) mood change, ( - ) new changes  All other systems were reviewed with the patient and are negative.  PHYSICAL EXAMINATION: ECOG PERFORMANCE STATUS: 1 - Symptomatic but completely ambulatory  There were no vitals filed for this visit.  There were no vitals filed for this visit.   GENERAL: Well-appearing elderly Caucasian male, alert, no distress and comfortable SKIN: skin color, texture, turgor are normal, significant lesions. Mild erythematous rash on chest and forehead, chest, and arms EYES: conjunctiva are pink and non-injected, sclera clear LUNGS: clear to auscultation and percussion with normal breathing effort HEART: regular rate & rhythm and no murmurs and no lower extremity edema Musculoskeletal: no cyanosis of digits and no clubbing  PSYCH: alert & oriented x 3, fluent speech NEURO: no focal motor/sensory deficits  LABORATORY DATA:  I have reviewed the data as listed    Latest Ref Rng & Units 02/17/2024   10:28 AM 02/08/2024   11:19 AM 12/06/2023   10:23 AM  CBC  WBC 4.0 - 10.5 K/uL 3.1  3.1  2.7   Hemoglobin 13.0 - 17.0 g/dL 86.1  85.6  87.5   Hematocrit 39.0 - 52.0 % 39.4  41.1  37.1   Platelets 150 - 400 K/uL 165  158  160        Latest Ref Rng & Units 02/17/2024   10:28 AM 12/06/2023   10:23 AM 10/28/2023    8:46 AM  CMP  Glucose 70 - 99 mg/dL 88  90  890   BUN 8 - 23 mg/dL 21  17  15    Creatinine 0.61 - 1.24 mg/dL 9.25  9.23  9.28   Sodium 135 - 145 mmol/L 139  142  139   Potassium 3.5 - 5.1 mmol/L 4.2  4.0  3.9   Chloride 98 - 111 mmol/L 107  108  109   CO2 22 - 32 mmol/L 27  27  25    Calcium  8.9 - 10.3 mg/dL 8.9  9.2  8.8   Total Protein 6.5 - 8.1 g/dL 6.8  6.5  6.6   Total Bilirubin 0.0 - 1.2 mg/dL 0.4  0.4  0.3   Alkaline Phos 38 - 126 U/L 69  63  61   AST 15 - 41 U/L 28  24  20    ALT 0 - 44 U/L 34  32  25     Lab Results  Component Value Date   MPROTEIN Not Observed 11/05/2016   Lab Results  Component Value  Date   KPAFRELGTCHN 2.46 (H) 11/03/2015   KPAFRELGTCHN 2.86 (H) 09/16/2011   KPAFRELGTCHN 2.86 (H) 09/16/2011   LAMBDASER 1.71 11/03/2015   LAMBDASER 2.65 (H) 09/16/2011   LAMBDASER 2.65 (H) 09/16/2011   KAPLAMBRATIO 1.25 11/05/2016   KAPLAMBRATIO 1.44 11/03/2015   KAPLAMBRATIO 1.08 09/16/2011   KAPLAMBRATIO 1.08 09/16/2011    RADIOGRAPHIC STUDIES: CT CHEST ABDOMEN PELVIS W CONTRAST Result Date: 06/05/2024 CLINICAL DATA:  Diffuse large B-cell lymphoma, additional history of prostate cancer * Tracking Code: BO * EXAM: CT CHEST, ABDOMEN, AND PELVIS WITH CONTRAST TECHNIQUE: Multidetector CT imaging of the chest, abdomen and pelvis was performed following the standard protocol during bolus administration of intravenous contrast. RADIATION DOSE REDUCTION: This exam was performed according to the departmental dose-optimization program which includes automated exposure control, adjustment of the mA and/or kV according to patient size and/or  use of iterative reconstruction technique. CONTRAST:  OMNIPAQUE  IOHEXOL  300 MG/ML  SOLN COMPARISON:  PET-CT, 11/21/2023 FINDINGS: CT CHEST FINDINGS Cardiovascular: No significant vascular findings. Normal heart size. Left and right coronary artery calcifications and stents. No pericardial effusion. Mediastinum/Nodes: No enlarged mediastinal, hilar, or axillary lymph nodes. Thyroid gland, trachea, and esophagus demonstrate no significant findings. Lungs/Pleura: Mild, bibasilar predominant irregular peripheral interstitial opacity and septal thickening. Bilateral calcified pleural plaques. No pleural effusion or pneumothorax. Musculoskeletal: No chest wall abnormality. No acute osseous findings. CT ABDOMEN PELVIS FINDINGS Hepatobiliary: No solid liver abnormality is seen. Small gallstones. No gallbladder wall thickening, or biliary dilatation. Pancreas: Unremarkable. No pancreatic ductal dilatation or surrounding inflammatory changes. Spleen: Normal in size without  significant abnormality. Adrenals/Urinary Tract: Adrenal glands are unremarkable. Kidneys are normal, without renal calculi, solid lesion, or hydronephrosis. Bladder is unremarkable. Stomach/Bowel: Stomach is within normal limits. Appendix appears normal. No evidence of bowel wall thickening, distention, or inflammatory changes. Descending and sigmoid diverticulosis. Vascular/Lymphatic: Aortic atherosclerosis. Significantly diminished size of a soft tissue mass overlying the right external iliac artery measuring 2.1 x 2.0 cm, previously 5.7 x 3.8 cm (series 2, image 111). No persistently enlarged abdominal or pelvic lymph nodes. Reproductive: Prostate brachytherapy Other: No abdominal wall hernia or abnormality. No ascites. Musculoskeletal: No acute osseous findings. IMPRESSION: 1. Significantly diminished size of a soft tissue mass overlying the right external iliac artery measuring 2.1 x 2.0 cm, previously 5.7 x 3.8 cm consistent with treatment response. 2. No persistently enlarged lymph nodes in the chest, abdomen, or pelvis. 3. Mild, bibasilar predominant irregular peripheral interstitial opacity and septal thickening, consistent with mild fibrosis. Bilateral calcified pleural plaques. 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 4. Prostate brachytherapy. 5. Coronary artery disease. 6. Cholelithiasis. Aortic Atherosclerosis (ICD10-I70.0). Electronically Signed   By: Marolyn JONETTA Jaksch M.D.   On: 06/05/2024 07:12    ASSESSMENT & PLAN Eugene Moreno is a 78 y.o.. male with medical history significant for diffuse large B-cell lymphoma who presents for a follow up visit.   #Diffuse Large B-cell Lymphoma, Stage III --Underwent US  guided biopsy of pelvic mass on 06/24/2023.  Pathology is consistent with B-cell lymphoma with a high proliferation rate of 80-90%. Morphologically the findings are most consistent with a diffuse large B-cell lymphoma. FISH studies r/o  Burkitt's lymphoma or double hit lymphoma. --Pre treatment PET CT scan on 07/06/2023 showed right external iliac nodal chain measuring 13.9 x 12.1 cm with an SUV max of 34.34. Multiple tracer avid lymph nodes are identified within the chest, abdomen and pelvis compatible with lymphoma. Consistent with Stage III disease.  PLAN: --completed 6 cycles of R-CHOP chemotherapy. --Labs today show white blood cell labs today show white blood cell count 3.0, hemoglobin 14.5, MCV 93.7, platelets 152 with normal creatinine and LFTs. --interval PET CT on 09/09/2023 showed areas of concern including 2 new sites of tracer avid peritoneal soft tissue along the undersurface of the right lower quadrant.  This measured Deauville criteria 5.  This was discussed with IR who noted the lesions were too small for biopsy and recommended continued observation.  There is also decreased intensity of the right external iliac nodes with the area measuring Deauville criteria 4 --post treatment PET CT scan showed positive response to therapy with regression of the previously noted soft tissue mass along the undersurface of the right anterior abdominal wall musculature, and resolution of previously noted mesenteric lymphadenopathy and hypermetabolism. However, there is a persistent nodal mass  in the right inguinal region which has slightly decreased in size but maintains peripheral predominant hypermetabolism (Deauville 4) -- Biopsy of concerning area showed inflammation but no evidence of underlying malignancy.  Will plan to pursue routine CT scans moving forward. --CT scan on 06/04/2024 showed significantly diminished size of the soft tissue mass overlying the right external iliac artery.  Additionally there was no evidence of residual recurrent lymph nodes in the chest, abdomen, or pelvis. --RTC in 12 weeks for labs, clinic visit and  CT scan in 6 months .   #Rash--resolved.  --Possibly related to antibiotic therapy --Patient is  currently taking claritin daily. We will monitor his rash while he takes his prednisone  over the next few days.  -- Prescribed hydrocortisone  2.5% cream sent to his pharmacy.  #Right groin pain: --Pain has improved and well controlled with Norco 5-325 mg BID as needed.  #H/O DVT involving right  --Diagnosed 06/12/2022, secondary to underlying lymphoma --Currently on Eliquis  5 mg twice daily -- Patient completed greater than 6 months of full-strength anticoagulation therapy and his symptoms have resolved.  The provoking factor was thought to be the lymphadenopathy from his lymphoma. -- Okay to discontinue Eliquis  at this time.  #Supportive Care -- port removed  -- no pain medication required at this time.   No orders of the defined types were placed in this encounter.  All questions were answered. The patient knows to call the clinic with any problems, questions or concerns.  I have spent a total of 30 minutes minutes of face-to-face and non-face-to-face time, preparing to see the patient,performing a medically appropriate examination, counseling and educating the patient, ordering tests/procedures, documenting clinical information in the electronic health record, independently interpreting results and communicating results to the patient, and care coordination.   Norleen IVAR Kidney, MD Department of Hematology/Oncology [facility] Phone: [phone] Pager: [phone] Email: [email or web address]  06/13/2024 7:21 AM

## 2024-06-14 ENCOUNTER — Other Ambulatory Visit: Payer: Self-pay

## 2024-06-14 NOTE — Addendum Note (Signed)
 Addended by: DWAYNE NORRIS A on: 06/14/2024 02:23 PM   Modules accepted: Orders

## 2024-07-06 ENCOUNTER — Other Ambulatory Visit: Payer: Self-pay

## 2024-07-23 ENCOUNTER — Ambulatory Visit: Attending: Cardiology | Admitting: Cardiology

## 2024-07-23 ENCOUNTER — Encounter: Payer: Self-pay | Admitting: Cardiology

## 2024-07-23 VITALS — BP 101/66 | HR 73 | Ht 71.5 in | Wt 207.8 lb

## 2024-07-23 DIAGNOSIS — I2510 Atherosclerotic heart disease of native coronary artery without angina pectoris: Secondary | ICD-10-CM

## 2024-07-23 DIAGNOSIS — E7800 Pure hypercholesterolemia, unspecified: Secondary | ICD-10-CM

## 2024-07-23 DIAGNOSIS — I10 Essential (primary) hypertension: Secondary | ICD-10-CM | POA: Diagnosis not present

## 2024-07-23 MED ORDER — ROSUVASTATIN CALCIUM 10 MG PO TABS: 10.0000 mg | ORAL_TABLET | Freq: Every day | ORAL | 3 refills | Status: AC

## 2024-07-23 MED ORDER — ICOSAPENT ETHYL 1 G PO CAPS: 1.0000 g | ORAL_CAPSULE | Freq: Two times a day (BID) | ORAL | 3 refills | Status: AC

## 2024-07-23 NOTE — Progress Notes (Signed)
 Office Visit    Patient Name: Eugene Moreno Date of Encounter: 07/23/2024  PCP:  Delayne Artist PARAS, MD   [facility]  Cardiologist:  Wilbert Bihari, MD  Advanced Practice Provider:  No care team member to display Electrophysiologist:  None   HPI    Eugene Moreno is a 78 y.o. male with a past medical history of ASCAD  status post AWMI 2002 status post PCI of the LAD with last nuclear stress test 5/17 with anterior scar but no ischemia   Here follow-up for follow-up today and is doing well.  He denies any chest pain or pressure, SOB, DOE, PND, orthopnea, lower extremity edema, PND, orthopnea, dizziness, palpitations or syncope.  Past Medical History    Past Medical History:  Diagnosis Date   Coronary artery disease    cardiologist--- dr t. Merlin Ege;  hx AWMI 09-06-2001  s/p cath with PCI and DES x1 to prox LAD;  last nuclear study 05-10-2016 in epic, anterior scar without ishcemia, nuclear ef 44%)   History of MI (myocardial infarction) 09/06/2001   anterior wall   Hyperlipemia    Hypertension    Leukopenia    chronic mild   Non Hodgkin's lymphoma (HCC)    Prostate cancer [facility])    urologist-- dr cam--- dx 05/ 2022, Gleason 4+3, PSA 8.04   S/P drug eluting coronary stent placement 09/06/2001   x1 to prox LAD   Wears glasses    Wears hearing aid in both ears    Past Surgical History:  Procedure Laterality Date   ANTERIOR CERVICAL DECOMP/DISCECTOMY FUSION  01/26/2002   @[facility]  by dr botero;   C6-C7   COLONOSCOPY  2020   CORONARY ANGIOPLASTY WITH STENT PLACEMENT  09/06/2001   @[facility]  by dr s. 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   SPACE OAR INSTILLATION N/A 09/03/2021   Procedure: SPACE OAR INSTILLATION;  Surgeon: cam Morene ORN, MD;  Location: [facility];  Service: Urology;  Laterality: N/A;   WISDOM TOOTH EXTRACTION      Allergies  Allergies  Allergen Reactions   Niacin And Related     Flushing     EKGs/Labs/Other Studies Reviewed:   The following studies were reviewed today:     Recent Labs: 06/13/2024: ALT 60; BUN 18; Creatinine 0.86; Hemoglobin 14.5; Platelet Count 152; Potassium 4.1; Sodium 142  Recent Lipid Panel    Component Value Date/Time   CHOL 133 05/03/2023 0830   TRIG 120 05/03/2023 0830   HDL 42 05/03/2023 0830   CHOLHDL 3.2 05/03/2023 0830   CHOLHDL 3.2 04/26/2016 0942   VLDL 23 04/26/2016 0942   LDLCALC 69 05/03/2023 0830     Home Medications   Current Meds  Medication Sig   aspirin EC 81 MG tablet Take 2 tablets (162 mg total) by mouth daily.   Cod Liver Oil CAPS Take 2 capsules by mouth daily.   ferrous fumarate (HEMOCYTE - 106 MG FE) 325 (106 FE) MG TABS Take 1 tablet by mouth daily.   folic acid (  FOLVITE) 800 MCG tablet Take 800 mcg by mouth daily.    hydrocortisone  2.5 % cream APPLY 500 MG TOPICALLY 2 (TWO) TIMES DAILY AS NEEDED.   icosapent  Ethyl (VASCEPA ) 1 g capsule Take 1 capsule (1 g total) by mouth 2 (two) times daily.   lidocaine -prilocaine  (EMLA ) cream Apply 1 Application topically as needed.   loratadine (CLARITIN) 10 MG tablet Take 10 mg by mouth daily.   ramipril  (ALTACE ) 2.5 MG capsule TAKE 1 CAPSULE BY MOUTH EVERY DAY   rosuvastatin  (CRESTOR ) 10 MG tablet TAKE 1 TABLET BY MOUTH EVERY DAY   vitamin C (ASCORBIC ACID) 500 MG tablet Take 1,000 mg by mouth daily.    vitamin E 400 UNIT capsule Take 400 Units by mouth daily.     Review of Systems      All other systems reviewed and are otherwise negative except as noted above.  Physical Exam    VS:  BP 101/66 (BP  Location: Left Arm, Patient Position: Sitting, Cuff Size: Normal)   Pulse 73   Ht 5' 11.5 (1.816 m)   Wt 207 lb 12.8 oz (94.3 kg)   SpO2 94%   BMI 28.58 kg/m  , BMI Body mass index is 28.58 kg/m.  Wt Readings from Last 3 Encounters:  07/23/24 207 lb 12.8 oz (94.3 kg)  06/13/24 206 lb 3.2 oz (93.5 kg)  02/17/24 203 lb 12.8 oz (92.4 kg)    GEN: Well nourished, well developed in no acute distress HEENT: Normal NECK: No JVD; No carotid bruits LYMPHATICS: No lymphadenopathy CARDIAC:RRR, no murmurs, rubs, gallops RESPIRATORY:  Clear to auscultation without rales, wheezing or rhonchi  ABDOMEN: Soft, non-tender, non-distended MUSCULOSKELETAL:  trace ankle edema; No deformity  SKIN: Warm and dry NEUROLOGIC:  Alert and oriented x 3 PSYCHIATRIC:  Normal affect  Assessment & Plan    CAD status post PCI of the LAD 2002 - He denies any anginal symptoms on exam today - Continue aspirin 81 mg daily and Crestor  10 mg daily with as needed refills  HLD - LDL goal less than 70 - I have personally reviewed and interpreted outside labs performed by patient's PCP which showed LDL 60, HDL 33 on 05/28/2024 and ALT 60 on 06/13/2024 - Continue Crestor  10 mg daily, Vascepa  2 g twice daily with as needed refills  HTN -BP controlled on exam today -Continue ramipril  2.5 mg daily with as needed refills -I have personally reviewed and interpreted outside labs performed by patient's PCP which showed serum creatinine 0.86 and potassium 4.1 on 06/13/2024    Disposition: Follow up 1 year   Signed, Wilbert Bihari, MD 07/23/2024, 9:45 AM [facility]

## 2024-07-23 NOTE — Addendum Note (Signed)
 Addended by: GRETEL MAEOLA CROME on: 07/23/2024 09:51 AM   Modules accepted: Orders

## 2024-07-30 ENCOUNTER — Telehealth: Payer: Self-pay

## 2024-07-30 NOTE — Telephone Encounter (Signed)
 Spoke with Augustin from [facility] in regards recent labs.  She requested recent labs faxed to their office.    Faxed recent labs to [phone] with confirmation.

## 2024-08-20 ENCOUNTER — Other Ambulatory Visit: Payer: Self-pay | Admitting: Cardiology

## 2024-09-27 ENCOUNTER — Inpatient Hospital Stay: Admitting: Hematology and Oncology

## 2024-09-27 ENCOUNTER — Inpatient Hospital Stay: Attending: Hematology and Oncology

## 2024-09-27 ENCOUNTER — Other Ambulatory Visit: Payer: Self-pay | Admitting: Hematology and Oncology

## 2024-09-27 VITALS — BP 110/68 | HR 70 | Temp 97.1°F | Resp 13 | Wt 201.3 lb

## 2024-09-27 DIAGNOSIS — Z7901 Long term (current) use of anticoagulants: Secondary | ICD-10-CM | POA: Diagnosis not present

## 2024-09-27 DIAGNOSIS — I700 Atherosclerosis of aorta: Secondary | ICD-10-CM | POA: Insufficient documentation

## 2024-09-27 DIAGNOSIS — K8020 Calculus of gallbladder without cholecystitis without obstruction: Secondary | ICD-10-CM | POA: Insufficient documentation

## 2024-09-27 DIAGNOSIS — E785 Hyperlipidemia, unspecified: Secondary | ICD-10-CM | POA: Diagnosis not present

## 2024-09-27 DIAGNOSIS — Z86718 Personal history of other venous thrombosis and embolism: Secondary | ICD-10-CM | POA: Insufficient documentation

## 2024-09-27 DIAGNOSIS — I2510 Atherosclerotic heart disease of native coronary artery without angina pectoris: Secondary | ICD-10-CM | POA: Diagnosis not present

## 2024-09-27 DIAGNOSIS — I252 Old myocardial infarction: Secondary | ICD-10-CM | POA: Insufficient documentation

## 2024-09-27 DIAGNOSIS — Z803 Family history of malignant neoplasm of breast: Secondary | ICD-10-CM | POA: Diagnosis not present

## 2024-09-27 DIAGNOSIS — C8333 Diffuse large B-cell lymphoma, intra-abdominal lymph nodes: Secondary | ICD-10-CM | POA: Diagnosis present

## 2024-09-27 DIAGNOSIS — Z87891 Personal history of nicotine dependence: Secondary | ICD-10-CM | POA: Diagnosis not present

## 2024-09-27 DIAGNOSIS — K5730 Diverticulosis of large intestine without perforation or abscess without bleeding: Secondary | ICD-10-CM | POA: Insufficient documentation

## 2024-09-27 DIAGNOSIS — Z7982 Long term (current) use of aspirin: Secondary | ICD-10-CM | POA: Insufficient documentation

## 2024-09-27 DIAGNOSIS — C8338 Diffuse large B-cell lymphoma, lymph nodes of multiple sites: Secondary | ICD-10-CM

## 2024-09-27 DIAGNOSIS — Z79899 Other long term (current) drug therapy: Secondary | ICD-10-CM | POA: Diagnosis not present

## 2024-09-27 DIAGNOSIS — Z8249 Family history of ischemic heart disease and other diseases of the circulatory system: Secondary | ICD-10-CM | POA: Insufficient documentation

## 2024-09-27 LAB — CBC WITH DIFFERENTIAL (CANCER CENTER ONLY)
Abs Immature Granulocytes: 0.01 K/uL (ref 0.00–0.07)
Basophils Absolute: 0 K/uL (ref 0.0–0.1)
Basophils Relative: 1 %
Eosinophils Absolute: 0.2 K/uL (ref 0.0–0.5)
Eosinophils Relative: 5 %
HCT: 41.8 % (ref 39.0–52.0)
Hemoglobin: 14.5 g/dL (ref 13.0–17.0)
Immature Granulocytes: 0 %
Lymphocytes Relative: 17 %
Lymphs Abs: 0.6 K/uL — ABNORMAL LOW (ref 0.7–4.0)
MCH: 32.4 pg (ref 26.0–34.0)
MCHC: 34.7 g/dL (ref 30.0–36.0)
MCV: 93.3 fL (ref 80.0–100.0)
Monocytes Absolute: 0.6 K/uL (ref 0.1–1.0)
Monocytes Relative: 17 %
Neutro Abs: 2.3 K/uL (ref 1.7–7.7)
Neutrophils Relative %: 60 %
Platelet Count: 166 K/uL (ref 150–400)
RBC: 4.48 MIL/uL (ref 4.22–5.81)
RDW: 12.6 % (ref 11.5–15.5)
WBC Count: 3.8 K/uL — ABNORMAL LOW (ref 4.0–10.5)
nRBC: 0 % (ref 0.0–0.2)

## 2024-09-27 LAB — CMP (CANCER CENTER ONLY)
ALT: 67 U/L — ABNORMAL HIGH (ref 0–44)
AST: 57 U/L — ABNORMAL HIGH (ref 15–41)
Albumin: 4.3 g/dL (ref 3.5–5.0)
Alkaline Phosphatase: 61 U/L (ref 38–126)
Anion gap: 6 (ref 5–15)
BUN: 16 mg/dL (ref 8–23)
CO2: 30 mmol/L (ref 22–32)
Calcium: 10 mg/dL (ref 8.9–10.3)
Chloride: 106 mmol/L (ref 98–111)
Creatinine: 0.74 mg/dL (ref 0.61–1.24)
GFR, Estimated: >60 mL/min (ref >60)
Glucose, Bld: 75 mg/dL (ref 70–99)
Potassium: 4.5 mmol/L (ref 3.5–5.1)
Sodium: 142 mmol/L (ref 135–145)
Total Bilirubin: 0.6 mg/dL (ref 0.0–1.2)
Total Protein: 7.1 g/dL (ref 6.5–8.1)

## 2024-09-27 LAB — LACTATE DEHYDROGENASE: LDH: 356 U/L — ABNORMAL HIGH (ref 98–192)

## 2024-09-27 NOTE — Progress Notes (Signed)
 [facility] [phone]   [phone]  PROGRESS NOTE  Patient Care Team: Delayne Artist PARAS, MD as PCP - General (Family Medicine) Shlomo Wilbert SAUNDERS, MD as PCP - Cardiology (Cardiology) Cam Morene ORN, MD as Attending Physician (Urology) Patrcia Cough, MD as Consulting Physician (Radiation Oncology) Starla Wendelyn BIRCH, RN as Registered Nurse Federico Norleen ONEIDA MADISON, MD as Consulting Physician (Hematology and Oncology)  Hematological/Oncological History # Diffuse Large B Cell Lymphoma, Stage III Presented to PCP with swelling and redness in his right leg x 3 weeks. 06/13/2023: Doppler US  showed acute DVT involving right  common femoral vein, SF junction, right femoral vein, right proximal profunda vein, and external iliac vein. Incident finding included pelvic lesion with minimum vascularity anterior to the prostate, measuring 11.8 x 9.6 12.6 cm, with a volume of 749.3 cubic centimeters. Prior MR on 04/11/2021 reports 1.7 x .07 x 1.1 cm category 4 lesion. This lesion appears to be causing extrinsic compression on the external iliac vein (proximal external iliac vein not visualized due to extrinsic compression).  06/16/2023: CT angio abdomen/pelvis: Large lobulated mass in the right pelvis encasing the right iliac vessels and abutting the anterior pelvic wall as well as right iliacus muscle and right bladder dome. This mass consistent with a neoplastic process and features most suggestive of lymphoma.Multiple splenic lesions most consistent with metastatic disease.Short segment thick wall small bowel in the mid abdomen to the right of midline most consistent with infiltrative malignancy/lymphoma. Right pelvic sidewall mass/adenopathy.Cholelithiasis. Colonic diverticulosis. Aortic Atherosclerosis. 06/21/2023: Establish care with [facility].  06/24/2023: US  guided biopsy of abdominal mass. Pathology is consistent with B-cell lymphoma with a high proliferation rate of  80-90%. Morphologically the findings are most consistent with a diffuse large B-cell lymphoma. FISH studies r/o Burkitt's lymphoma or double hit lymphoma. 07/15/2023: Cycle 1 Day 1 of R-CHOP chemotherapy.  08/05/2023: Cycle 2 Day 1 of R-CHOP chemotherapy.  08/26/2023: Cycle 3 Day 1 of R-CHOP chemotherapy.  09/15/2023: Cycle 4 Day 1 of R-CHOP chemotherapy.  10/07/2023: Cycle 5 Day 1 of R-CHOP chemotherapy.  10/28/2023: Cycle 6 Day 1 of R-CHOP chemotherapy.   Interval History:  Eugene Moreno 78 y.o. male with medical history significant for diffuse large B-cell lymphoma who presents for a follow up visit. The patient's last visit was on 06/13/2024. In the interim, he has had no major changes in her health.   On exam today Mr. Flanagin reports he has been well overall in interim since her last visit and recently had a very nice trip to [place] .  He was able to take a scooter ride along [address].  He has an upcoming trip to [place] for a safari later this year.  He reports overall he feels very well.  His energy levels are strong and his weight has been steady.  He denies any runny nose, sore throat, cough.  He is no fevers, chills, sweats.  He denies any bumps or lumps concerning for lymphadenopathy.  He did recently have a tooth extraction and had no difficulty with that procedure.  He has been eating well and notes nothing else out of the ordinary.  He is not currently any pain anywhere and has no other upcoming procedures.  He otherwise denies any fevers, chills, sweats, nausea, vomiting or diarrhea.  Full 10 point ROS is otherwise negative.  MEDICAL HISTORY:  Past Medical History:  Diagnosis Date   Coronary artery disease    cardiologist--- dr t. turner;  hx AWMI  09-06-2001  s/p cath with PCI and DES x1 to prox LAD;  last nuclear study 05-10-2016 in epic, anterior scar without ishcemia, nuclear ef 44%)   History of MI (myocardial infarction) 09/06/2001   anterior wall   Hyperlipemia     Hypertension    Leukopenia    chronic mild   Non Hodgkin's lymphoma (HCC)    Prostate cancer [facility])    urologist-- dr cam--- dx 05/ 2022, Gleason 4+3, PSA 8.04   S/P drug eluting coronary stent placement 09/06/2001   x1 to prox LAD   Wears glasses    Wears hearing aid in both ears     SURGICAL HISTORY: Past Surgical History:  Procedure Laterality Date   ANTERIOR CERVICAL DECOMP/DISCECTOMY FUSION  01/26/2002   @[facility]  by dr botero;   C6-C7   COLONOSCOPY  2020   CORONARY ANGIOPLASTY WITH STENT PLACEMENT  09/06/2001   @[facility]  by dr s. 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   SPACE OAR INSTILLATION N/A 09/03/2021   Procedure: SPACE OAR INSTILLATION;  Surgeon: cam Morene ORN, MD;  Location: [facility];  Service: Urology;  Laterality: N/A;   WISDOM TOOTH EXTRACTION      SOCIAL HISTORY: Social History   Socioeconomic History   Marital status: Married    Spouse name: Not on file   Number of children: Not on file   Years of education: Not on file   Highest education level: Not on file  Occupational History   Occupation: retired    Associate Professor: [employer]    Comment: Systems analyst  Tobacco Use   Smoking status: Former    Current packs/day: 0.00    Types: Cigarettes    Start date: 1965    Quit date: 1969    Years since quitting: 56.8   Smokeless tobacco: Never  Vaping Use   Vaping status: Never Used  Substance and Sexual Activity   Alcohol use: Yes    Comment: occasional   Drug use: Never   Sexual activity: Yes    Comment: vasectomy  Other Topics  Concern   Not on file  Social History Narrative   Not on file   Social Drivers of Health   Financial Resource Strain: Not on file  Food Insecurity: No Food Insecurity (06/21/2023)   Hunger Vital Sign    Worried About Running Out of Food in the Last Year: Never true    Ran Out of Food in the Last Year: Never true  Transportation Needs: No Transportation Needs (06/21/2023)   PRAPARE - Administrator, Civil Service (Medical): No    Lack of Transportation (Non-Medical): No  Physical Activity: Not on file  Stress: Not on file  Social Connections: Not on file  Intimate Partner Violence: Not At Risk (06/21/2023)   Humiliation, Afraid, Rape, and Kick questionnaire    Fear of Current or Ex-Partner: No    Emotionally Abused: No    Physically Abused: No    Sexually Abused: No    FAMILY HISTORY: Family History  Problem Relation Age of Onset  Breast cancer Mother 2   Heart attack Father        x3 between ages 64-64   Other Sister 55       angioplasty    ALLERGIES:  is allergic to niacin and related.  MEDICATIONS:  Current Outpatient Medications  Medication Sig Dispense Refill   aspirin EC 81 MG tablet Take 2 tablets (162 mg total) by mouth daily.     Cod Liver Oil CAPS Take 2 capsules by mouth daily.     ferrous fumarate (HEMOCYTE - 106 MG FE) 325 (106 FE) MG TABS Take 1 tablet by mouth daily.     folic acid (FOLVITE) 800 MCG tablet Take 800 mcg by mouth daily.      hydrocortisone  2.5 % cream APPLY 500 MG TOPICALLY 2 (TWO) TIMES DAILY AS NEEDED. 28 g 2   icosapent  Ethyl (VASCEPA ) 1 g capsule Take 1 capsule (1 g total) by mouth 2 (two) times daily. 180 capsule 3   lidocaine -prilocaine  (EMLA ) cream Apply 1 Application topically as needed. 30 g 0   loratadine (CLARITIN) 10 MG tablet Take 10 mg by mouth daily.     ramipril  (ALTACE ) 2.5 MG capsule Take 1 capsule (2.5 mg total) by mouth daily. 90 capsule 3   rosuvastatin  (CRESTOR ) 10 MG tablet Take 1 tablet (10 mg total) by  mouth daily. 90 tablet 3   vitamin C (ASCORBIC ACID) 500 MG tablet Take 1,000 mg by mouth daily.      vitamin E 400 UNIT capsule Take 400 Units by mouth daily.     No current facility-administered medications for this visit.    REVIEW OF SYSTEMS:   Constitutional: ( - ) fevers, ( - )  chills , ( - ) night sweats Eyes: ( - ) blurriness of vision, ( - ) double vision, ( - ) watery eyes Ears, nose, mouth, throat, and face: ( - ) mucositis, ( - ) sore throat Respiratory: ( - ) cough, ( - ) dyspnea, ( - ) wheezes Cardiovascular: ( - ) palpitation, ( - ) chest discomfort, ( - ) lower extremity swelling Gastrointestinal:  ( - ) nausea, ( - ) heartburn, ( - ) change in bowel habits Skin: ( - ) abnormal skin rashes Lymphatics: ( - ) new lymphadenopathy, ( - ) easy bruising Neurological: ( - ) numbness, ( - ) tingling, ( - ) new weaknesses Behavioral/Psych: ( - ) mood change, ( - ) new changes  All other systems were reviewed with the patient and are negative.  PHYSICAL EXAMINATION: ECOG PERFORMANCE STATUS: 1 - Symptomatic but completely ambulatory  Vitals:   09/27/24 1115  BP: 110/68  Pulse: 70  Resp: 13  Temp: (!) 97.1 F (36.2 C)  SpO2: 97%    Filed Weights   09/27/24 1115  Weight: 201 lb 4.8 oz (91.3 kg)     GENERAL: Well-appearing elderly Caucasian male, alert, no distress and comfortable SKIN: skin color, texture, turgor are normal, significant lesions. Mild erythematous rash on chest and forehead, chest, and arms EYES: conjunctiva are pink and non-injected, sclera clear LUNGS: clear to auscultation and percussion with normal breathing effort HEART: regular rate & rhythm and no murmurs and no lower extremity edema Musculoskeletal: no cyanosis of digits and no clubbing  PSYCH: alert & oriented x 3, fluent speech NEURO: no focal motor/sensory deficits  LABORATORY DATA:  I have reviewed the data as listed    Latest Ref Rng & Units 09/27/2024   10:39 AM 06/13/2024  9:21  AM 02/17/2024   10:28 AM  CBC  WBC 4.0 - 10.5 K/uL 3.8  3.0  3.1   Hemoglobin 13.0 - 17.0 g/dL 85.4  85.4  86.1   Hematocrit 39.0 - 52.0 % 41.8  41.7  39.4   Platelets 150 - 400 K/uL 166  152  165        Latest Ref Rng & Units 09/27/2024   10:39 AM 06/13/2024    9:21 AM 02/17/2024   10:28 AM  CMP  Glucose 70 - 99 mg/dL 75  91  88   BUN 8 - 23 mg/dL 16  18  21    Creatinine 0.61 - 1.24 mg/dL 9.25  9.13  9.25   Sodium 135 - 145 mmol/L 142  142  139   Potassium 3.5 - 5.1 mmol/L 4.5  4.1  4.2   Chloride 98 - 111 mmol/L 106  109  107   CO2 22 - 32 mmol/L 30  24  27    Calcium  8.9 - 10.3 mg/dL 89.9  9.3  8.9   Total Protein 6.5 - 8.1 g/dL 7.1  6.9  6.8   Total Bilirubin 0.0 - 1.2 mg/dL 0.6  0.5  0.4   Alkaline Phos 38 - 126 U/L 61  58  69   AST 15 - 41 U/L 57  52  28   ALT 0 - 44 U/L 67  60  34     Lab Results  Component Value Date   MPROTEIN Not Observed 11/05/2016   Lab Results  Component Value Date   KPAFRELGTCHN 2.46 (H) 11/03/2015   KPAFRELGTCHN 2.86 (H) 09/16/2011   KPAFRELGTCHN 2.86 (H) 09/16/2011   LAMBDASER 1.71 11/03/2015   LAMBDASER 2.65 (H) 09/16/2011   LAMBDASER 2.65 (H) 09/16/2011   KAPLAMBRATIO 1.25 11/05/2016   KAPLAMBRATIO 1.44 11/03/2015   KAPLAMBRATIO 1.08 09/16/2011   KAPLAMBRATIO 1.08 09/16/2011    RADIOGRAPHIC STUDIES: No results found.   ASSESSMENT & PLAN Eugene Moreno is a 78 y.o.. male with medical history significant for diffuse large B-cell lymphoma who presents for a follow up visit.   #Diffuse Large B-cell Lymphoma, Stage III --Underwent US  guided biopsy of pelvic mass on 06/24/2023.  Pathology is consistent with B-cell lymphoma with a high proliferation rate of 80-90%. Morphologically the findings are most consistent with a diffuse large B-cell lymphoma. FISH studies r/o Burkitt's lymphoma or double hit lymphoma. --Pre treatment PET CT scan on 07/06/2023 showed right external iliac nodal chain measuring 13.9 x 12.1 cm with an SUV max of  34.34. Multiple tracer avid lymph nodes are identified within the chest, abdomen and pelvis compatible with lymphoma. Consistent with Stage III disease.  --interval PET CT on 09/09/2023 showed areas of concern including 2 new sites of tracer avid peritoneal soft tissue along the undersurface of the right lower quadrant.  This measured Deauville criteria 5.  This was discussed with IR who noted the lesions were too small for biopsy and recommended continued observation.  There is also decreased intensity of the right external iliac nodes with the area measuring Deauville criteria 4 --post treatment PET CT scan showed positive response to therapy with regression of the previously noted soft tissue mass along the undersurface of the right anterior abdominal wall musculature, and resolution of previously noted mesenteric lymphadenopathy and hypermetabolism. However, there is a persistent nodal mass in the right inguinal region which has slightly decreased in size but maintains peripheral predominant hypermetabolism (Deauville 4) -- Biopsy of concerning area showed  inflammation but no evidence of underlying malignancy.  Will plan to pursue routine CT scans moving forward. PLAN: --completed 6 cycles of R-CHOP chemotherapy. --Labs today show white blood cell labs today show white blood cell count 3.8, Hgb 14.5, MCV 93.3, Plt 166  with normal creatinine and LFTs. --CT scan on 06/04/2024 showed significantly diminished size of the soft tissue mass overlying the right external iliac artery.  Additionally there was no evidence of residual recurrent lymph nodes in the chest, abdomen, or pelvis. Repeat CT scan in Dec 2025.  --RTC in 12 weeks for labs, clinic visit, and repeat interval CT scan .   #Rash--resolved.  --Possibly related to antibiotic therapy --Patient is currently taking claritin daily. We will monitor his rash while he takes his prednisone  over the next few days.  -- Prescribed hydrocortisone  2.5% cream  sent to his pharmacy.  #Right groin pain: --Pain has improved and well controlled with Norco 5-325 mg BID as needed.  #H/O DVT involving right  --Diagnosed 06/12/2022, secondary to underlying lymphoma --Currently on Eliquis  5 mg twice daily -- Patient completed greater than 6 months of full-strength anticoagulation therapy and his symptoms have resolved.  The provoking factor was thought to be the lymphadenopathy from his lymphoma. -- Okay to discontinue Eliquis  at this time.  #Supportive Care -- port removed  -- no pain medication required at this time.   No orders of the defined types were placed in this encounter.  All questions were answered. The patient knows to call the clinic with any problems, questions or concerns.  I have spent a total of 30 minutes minutes of face-to-face and non-face-to-face time, preparing to see the patient,performing a medically appropriate examination, counseling and educating the patient, ordering tests/procedures, documenting clinical information in the electronic health record, independently interpreting results and communicating results to the patient, and care coordination.   Norleen IVAR Kidney, MD Department of Hematology/Oncology [facility] Phone: [phone] Pager: [phone] Email: [email or web address]  09/27/2024 3:19 PM

## 2024-09-28 ENCOUNTER — Other Ambulatory Visit: Payer: Self-pay

## 2024-10-26 ENCOUNTER — Other Ambulatory Visit: Payer: Self-pay

## 2024-11-30 ENCOUNTER — Encounter: Payer: Self-pay | Admitting: Hematology and Oncology

## 2024-12-03 ENCOUNTER — Ambulatory Visit (HOSPITAL_COMMUNITY): Admission: RE | Admit: 2024-12-03 | Discharge: 2024-12-03 | Attending: Hematology and Oncology

## 2024-12-03 DIAGNOSIS — C8338 Diffuse large B-cell lymphoma, lymph nodes of multiple sites: Secondary | ICD-10-CM | POA: Insufficient documentation

## 2024-12-03 MED ORDER — SODIUM CHLORIDE (PF) 0.9 % IJ SOLN
INTRAMUSCULAR | Status: AC
  Filled 2024-12-03: qty 50

## 2024-12-03 MED ORDER — IOHEXOL 9 MG/ML PO SOLN
500.0000 mL | ORAL | Status: AC
  Administered 2024-12-03 (×2): 500 mL via ORAL

## 2024-12-03 MED ORDER — IOHEXOL 300 MG/ML  SOLN
100.0000 mL | Freq: Once | INTRAMUSCULAR | Status: AC | PRN
  Administered 2024-12-03: 16:00:00 100 mL via INTRAVENOUS

## 2024-12-06 ENCOUNTER — Ambulatory Visit: Payer: Self-pay | Admitting: *Deleted

## 2024-12-06 NOTE — Telephone Encounter (Signed)
-----   Message from Norleen Kidney, MD sent at 12/05/2024  8:43 AM EST ----- Please let Eugene Moreno know that his CT scan looks stable. No overt evidence of progression. We will plan to see him back as scheduled in Jan 2026

## 2024-12-06 NOTE — Telephone Encounter (Signed)
 TCT patient regarding recent scan results.  Advised  that his CT scan looks stable. No overt evidence of progression. We will plan to see him back as scheduled in Jan 2026. Reviewed appt date and time with him for his January 2026 appt.

## 2024-12-28 ENCOUNTER — Ambulatory Visit: Admitting: Hematology and Oncology

## 2024-12-28 ENCOUNTER — Other Ambulatory Visit

## 2024-12-30 ENCOUNTER — Other Ambulatory Visit: Payer: Self-pay | Admitting: Hematology and Oncology

## 2024-12-30 DIAGNOSIS — C8338 Diffuse large B-cell lymphoma, lymph nodes of multiple sites: Secondary | ICD-10-CM

## 2024-12-30 NOTE — Progress Notes (Unsigned)
 " [facility] [phone]   [phone]  PROGRESS NOTE  Patient Care Team: Delayne Artist PARAS, MD as PCP - General (Family Medicine) Shlomo Wilbert SAUNDERS, MD as PCP - Cardiology (Cardiology) Cam Morene ORN, MD as Attending Physician (Urology) Patrcia Cough, MD as Consulting Physician (Radiation Oncology) Starla Wendelyn BIRCH, RN as Registered Nurse Federico Norleen ONEIDA MADISON, MD as Consulting Physician (Hematology and Oncology)  Hematological/Oncological History # Diffuse Large B Cell Lymphoma, Stage III Presented to PCP with swelling and redness in his right leg x 3 weeks. 06/13/2023: Doppler US  showed acute DVT involving right  common femoral vein, SF junction, right femoral vein, right proximal profunda vein, and external iliac vein. Incident finding included pelvic lesion with minimum vascularity anterior to the prostate, measuring 11.8 x 9.6 12.6 cm, with a volume of 749.3 cubic centimeters. Prior MR on 04/11/2021 reports 1.7 x .07 x 1.1 cm category 4 lesion. This lesion appears to be causing extrinsic compression on the external iliac vein (proximal external iliac vein not visualized due to extrinsic compression).  06/16/2023: CT angio abdomen/pelvis: Large lobulated mass in the right pelvis encasing the right iliac vessels and abutting the anterior pelvic wall as well as right iliacus muscle and right bladder dome. This mass consistent with a neoplastic process and features most suggestive of lymphoma.Multiple splenic lesions most consistent with metastatic disease.Short segment thick wall small bowel in the mid abdomen to the right of midline most consistent with infiltrative malignancy/lymphoma. Right pelvic sidewall mass/adenopathy.Cholelithiasis. Colonic diverticulosis. Aortic Atherosclerosis. 06/21/2023: Establish care with [facility].  06/24/2023: US  guided biopsy of abdominal mass. Pathology is consistent with B-cell lymphoma with a high proliferation rate of  80-90%. Morphologically the findings are most consistent with a diffuse large B-cell lymphoma. FISH studies r/o Burkitt's lymphoma or double hit lymphoma. 07/15/2023: Cycle 1 Day 1 of R-CHOP chemotherapy.  08/05/2023: Cycle 2 Day 1 of R-CHOP chemotherapy.  08/26/2023: Cycle 3 Day 1 of R-CHOP chemotherapy.  09/15/2023: Cycle 4 Day 1 of R-CHOP chemotherapy.  10/07/2023: Cycle 5 Day 1 of R-CHOP chemotherapy.  10/28/2023: Cycle 6 Day 1 of R-CHOP chemotherapy.   Interval History:  Eugene Moreno 79 y.o. male with medical history significant for diffuse large B-cell lymphoma who presents for a follow up visit. The patient's last visit was on 09/27/2024. In the interim, he has had no major changes in her health.   On exam today Eugene Moreno reports he had a wonderful holiday season.  He reports he is had no recent ER visits, hospitalizations, or new medications.  He reports his energy levels are good and reports about 8 out of 10 today.  His appetite is also strong.  He has had no issues with runny nose, sore throat, cough.  Denies any fevers, chills, sweats.  He reports that he did receive his flu shot this year.  He reports his weight tends to be up and down but today appears to be mostly decreasing.  He reports he does not have any bumps or lumps concerning for lymphadenopathy.  Overall he feels like he has baseline level of health and has no questions concerns or complaints today.  A full 10 point ROS is otherwise negative.  MEDICAL HISTORY:  Past Medical History:  Diagnosis Date   Coronary artery disease    cardiologist--- dr t. turner;  hx AWMI 09-06-2001  s/p cath with PCI and DES x1 to prox LAD;  last nuclear study 05-10-2016 in epic, anterior scar without ishcemia,  nuclear ef 44%)   History of MI (myocardial infarction) 09/06/2001   anterior wall   Hyperlipemia    Hypertension    Leukopenia    chronic mild   Non Hodgkin's lymphoma (HCC)    Prostate cancer [facility])    urologist-- dr cam---  dx 05/ 2022, Gleason 4+3, PSA 8.04   S/P drug eluting coronary stent placement 09/06/2001   x1 to prox LAD   Wears glasses    Wears hearing aid in both ears     SURGICAL HISTORY: Past Surgical History:  Procedure Laterality Date   ANTERIOR CERVICAL DECOMP/DISCECTOMY FUSION  01/26/2002   @[facility]  by dr botero;   C6-C7   COLONOSCOPY  2020   CORONARY ANGIOPLASTY WITH STENT PLACEMENT  09/06/2001   @[facility]  by dr s. 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00    Types: Cigarettes    Start date: 1965    Quit date: 1969    Years since quitting: 57.0   Smokeless tobacco: Never  Vaping Use   Vaping status: Never Used  Substance and Sexual Activity   Alcohol use: Yes    Comment: occasional   Drug use: Never   Sexual activity: Yes    Comment: vasectomy  Other Topics Concern   Not on file  Social History Narrative   Not on file   Social Drivers of Health   Tobacco Use: Medium Risk (07/23/2024)    Patient History    Smoking Tobacco Use: Former    Smokeless Tobacco Use: Never    Passive Exposure: Not on Actuary Strain: Not on file  Food Insecurity: No Food Insecurity (06/21/2023)   Hunger Vital Sign    Worried About Running Out of Food in the Last Year: Never true    Ran Out of Food in the Last Year: Never true  Transportation Needs: No Transportation Needs (06/21/2023)   PRAPARE - Administrator, Civil Service (Medical): No    Lack of Transportation (Non-Medical): No  Physical Activity: Not on file  Stress: Not on file  Social Connections: Not on file  Intimate Partner Violence: Not At Risk (06/21/2023)   Humiliation, Afraid, Rape, and Kick questionnaire    Fear of Current or Ex-Partner: No    Emotionally Abused: No    Physically Abused: No    Sexually Abused: No  Depression (PHQ2-9): Low  Risk (06/21/2023)   Depression (PHQ2-9)    PHQ-2 Score: 0  Alcohol Screen: Not on file  Housing: Low Risk (06/21/2023)   Housing    Last Housing Risk Score: 0  Utilities: Not At Risk (06/21/2023)   AHC Utilities    Threatened with loss of utilities: No  Health Literacy: Not on file    FAMILY HISTORY: Family History  Problem Relation Age of Onset   Breast cancer Mother 28   Heart attack Father        x3 between ages 51-64   Other Sister 13       angioplasty    ALLERGIES:  is allergic to niacin and related.  MEDICATIONS:  Current Outpatient Medications  Medication Sig Dispense Refill   aspirin EC 81 MG tablet Take 2 tablets (162 mg total) by mouth daily.     Cod Liver Oil CAPS Take 2 capsules by mouth daily.     ferrous fumarate (HEMOCYTE - 106 MG FE) 325 (106 FE) MG TABS Take 1 tablet by mouth daily.     folic acid (FOLVITE) 800 MCG tablet Take 800 mcg by mouth daily.      hydrocortisone  2.5 % cream APPLY 500 MG TOPICALLY 2 (TWO) TIMES DAILY AS NEEDED. 28 g 2   icosapent  Ethyl (VASCEPA ) 1 g capsule Take 1 capsule (1 g total) by mouth 2 (two) times daily.  180 capsule 3   lidocaine -prilocaine  (EMLA ) cream Apply 1 Application topically as needed. 30 g 0   loratadine (CLARITIN) 10 MG tablet Take 10 mg by mouth daily.     ramipril  (ALTACE ) 2.5 MG capsule Take 1 capsule (2.5 mg total) by mouth daily. 90 capsule 3   rosuvastatin  (CRESTOR ) 10 MG tablet Take 1 tablet (10 mg total) by mouth daily. 90 tablet 3   vitamin C (ASCORBIC ACID) 500 MG tablet Take 1,000 mg by mouth daily.      vitamin E 400 UNIT capsule Take 400 Units by mouth daily.     No current facility-administered medications for this visit.    REVIEW OF SYSTEMS:   Constitutional: ( - ) fevers, ( - )  chills , ( - ) night sweats Eyes: ( - ) blurriness of vision, ( - ) double vision, ( - ) watery eyes Ears, nose, mouth, throat, and face: ( - ) mucositis, ( - ) sore throat Respiratory: ( - ) cough, ( - ) dyspnea, ( - ) wheezes Cardiovascular: ( - ) palpitation, ( - ) chest discomfort, ( - ) lower extremity swelling Gastrointestinal:  ( - ) nausea, ( - ) heartburn, ( - ) change in bowel habits Skin: ( - ) abnormal skin rashes Lymphatics: ( - ) new lymphadenopathy, ( - ) easy bruising Neurological: ( - ) numbness, ( - ) tingling, ( - ) new weaknesses Behavioral/Psych: ( - ) mood change, ( - ) new changes  All other systems were reviewed with the patient and are negative.  PHYSICAL EXAMINATION: ECOG PERFORMANCE STATUS: 1 - Symptomatic but completely ambulatory  Vitals:   12/31/24 1057  BP: 115/75  Pulse: 72  Resp: 14  Temp: 97.7 F (36.5 C)  SpO2: 98%     Filed Weights   12/31/24 1057  Weight: 199 lb 8 oz (90.5 kg)      GENERAL: Well-appearing elderly Caucasian male, alert, no distress and comfortable SKIN: skin color, texture, turgor are normal, significant lesions. Mild erythematous rash on chest and forehead, chest, and arms EYES: conjunctiva are pink  and non-injected, sclera clear LUNGS: clear to auscultation and percussion with normal breathing effort HEART:  regular rate & rhythm and no murmurs and no lower extremity edema Musculoskeletal: no cyanosis of digits and no clubbing  PSYCH: alert & oriented x 3, fluent speech NEURO: no focal motor/sensory deficits  LABORATORY DATA:  I have reviewed the data as listed    Latest Ref Rng & Units 12/31/2024   10:35 AM 09/27/2024   10:39 AM 06/13/2024    9:21 AM  CBC  WBC 4.0 - 10.5 K/uL 4.8  3.8  3.0   Hemoglobin 13.0 - 17.0 g/dL 84.3  85.4  85.4   Hematocrit 39.0 - 52.0 % 45.7  41.8  41.7   Platelets 150 - 400 K/uL 157  166  152        Latest Ref Rng & Units 12/31/2024   10:35 AM 09/27/2024   10:39 AM 06/13/2024    9:21 AM  CMP  Glucose 70 - 99 mg/dL 94  75  91   BUN 8 - 23 mg/dL 18  16  18    Creatinine 0.61 - 1.24 mg/dL 9.28  9.25  9.13   Sodium 135 - 145 mmol/L 140  142  142   Potassium 3.5 - 5.1 mmol/L 4.4  4.5  4.1   Chloride 98 - 111 mmol/L 105  106  109   CO2 22 - 32 mmol/L 22  30  24    Calcium  8.9 - 10.3 mg/dL 9.3  89.9  9.3   Total Protein 6.5 - 8.1 g/dL 7.9  7.1  6.9   Total Bilirubin 0.0 - 1.2 mg/dL 0.6  0.6  0.5   Alkaline Phos 38 - 126 U/L 71  61  58   AST 15 - 41 U/L 36  57  52   ALT 0 - 44 U/L 41  67  60     Lab Results  Component Value Date   MPROTEIN Not Observed 11/05/2016   Lab Results  Component Value Date   KPAFRELGTCHN 2.46 (H) 11/03/2015   KPAFRELGTCHN 2.86 (H) 09/16/2011   KPAFRELGTCHN 2.86 (H) 09/16/2011   LAMBDASER 1.71 11/03/2015   LAMBDASER 2.65 (H) 09/16/2011   LAMBDASER 2.65 (H) 09/16/2011   KAPLAMBRATIO 1.25 11/05/2016   KAPLAMBRATIO 1.44 11/03/2015   KAPLAMBRATIO 1.08 09/16/2011   KAPLAMBRATIO 1.08 09/16/2011    RADIOGRAPHIC STUDIES: CT CHEST ABDOMEN PELVIS W CONTRAST Result Date: 12/04/2024 CLINICAL DATA:  Diffuse large B-cell lymphoma, monitor. * Tracking Code: BO * EXAM: CT CHEST, ABDOMEN, AND PELVIS WITH CONTRAST TECHNIQUE: Multidetector CT imaging of the chest, abdomen and pelvis was performed following the standard protocol during bolus  administration of intravenous contrast. RADIATION DOSE REDUCTION: This exam was performed according to the departmental dose-optimization program which includes automated exposure control, adjustment of the mA and/or kV according to patient size and/or use of iterative reconstruction technique. CONTRAST:  OMNIPAQUE  IOHEXOL  300 MG/ML  SOLN COMPARISON:  Multiple priors including CT January 05, 2024 FINDINGS: CT CHEST FINDINGS Cardiovascular: Normal caliber thoracic aorta. Normal size heart. No significant pericardial effusion/thickening. Mediastinum/Nodes: Slight increase in size of prominent mediastinal lymph nodes for instance a high right paratracheal lymph node measuring 6 mm in short axis on image 13/2 previously measured 4 mm. No pathologically enlarged mediastinal, hilar or axillary lymph nodes. The esophagus is grossly unremarkable. Lungs/Pleura: Mild emphysema. Scattered subpleural reticulations. Calcified pleural plaques. No suspicious pulmonary nodules or masses. Musculoskeletal: No aggressive lytic or blastic lesion of bone. Anterior cervical fusion hardware.  Thoracic spondylosis. CT ABDOMEN PELVIS FINDINGS Hepatobiliary: No suspicious hepatic lesion. Cholelithiasis. No biliary ductal dilation. Pancreas: No pancreatic ductal dilation or evidence of acute inflammation. Spleen: No splenomegaly Adrenals/Urinary Tract: No suspicious adrenal nodule/mass. No hydronephrosis. Fluid density left renal lesions are compatible with cysts. Urinary bladder is unremarkable for degree of distension. Stomach/Bowel: Radiopaque enteric contrast material traverses the descending colon. Colonic diverticulosis. No evidence of bowel obstruction. Vascular/Lymphatic: Normal caliber abdominal aorta. Smooth IVC contours. Similar size of the soft tissue mass overlying the right external iliac artery measuring 2.6 x 1.8 cm on image 109/2 previously 2.5 x 1.7 cm when remeasured for consistency. A few scattered prominent  retroperitoneal and iliac side chain lymph nodes are stable from prior. For reference an 8 mm short axis right external iliac lymph node on image 104/2 is unchanged. No new pathologically enlarged or enlarging abdominal or pelvic lymph nodes identified. Reproductive: Radiation therapy seeds in the prostate gland. Other: No significant abdominopelvic free fluid. Musculoskeletal: No aggressive lytic or blastic lesion of bone. Multilevel degenerative change of the spine. IMPRESSION: 1. Similar size of the soft tissue mass overlying the right external iliac artery. 2. Slight increase in size of prominent mediastinal lymph nodes, nonspecific. Attention on follow-up imaging suggested. 3. No new pathologically enlarged or enlarging abdominal or pelvic lymph nodes identified. 4. Cholelithiasis. 5. Colonic diverticulosis. Electronically Signed   By: Reyes Holder M.D.   On: 12/04/2024 11:44     ASSESSMENT & PLAN LYNKIN SAINI is a 79 y.o.. male with medical history significant for diffuse large B-cell lymphoma who presents for a follow up visit.   #Diffuse Large B-cell Lymphoma, Stage III --Underwent US  guided biopsy of pelvic mass on 06/24/2023.  Pathology is consistent with B-cell lymphoma with a high proliferation rate of 80-90%. Morphologically the findings are most consistent with a diffuse large B-cell lymphoma. FISH studies r/o Burkitt's lymphoma or double hit lymphoma. --Pre treatment PET CT scan on 07/06/2023 showed right external iliac nodal chain measuring 13.9 x 12.1 cm with an SUV max of 34.34. Multiple tracer avid lymph nodes are identified within the chest, abdomen and pelvis compatible with lymphoma. Consistent with Stage III disease.  --interval PET CT on 09/09/2023 showed areas of concern including 2 new sites of tracer avid peritoneal soft tissue along the undersurface of the right lower quadrant.  This measured Deauville criteria 5.  This was discussed with IR who noted the lesions were too  small for biopsy and recommended continued observation.  There is also decreased intensity of the right external iliac nodes with the area measuring Deauville criteria 4 --post treatment PET CT scan showed positive response to therapy with regression of the previously noted soft tissue mass along the undersurface of the right anterior abdominal wall musculature, and resolution of previously noted mesenteric lymphadenopathy and hypermetabolism. However, there is a persistent nodal mass in the right inguinal region which has slightly decreased in size but maintains peripheral predominant hypermetabolism (Deauville 4) -- Biopsy of concerning area showed inflammation but no evidence of underlying malignancy.  Will plan to pursue routine CT scans moving forward. PLAN: --completed 6 cycles of R-CHOP chemotherapy. --Labs today show white blood cell labs today show white blood cell count 4.8, Hgb 15.6, MCV 94.6, Plt 157 with normal creatinine and LFTs. --CT scan on 12/03/2024 showed stable lymph nodes with no overt signs of progression.  Additionally there was no evidence of new lymph nodes in the chest, abdomen, or pelvis. Repeat CT scan in June 2026 --  RTC in 12 weeks for labs, clinic visit, and repeat interval CT scan in June 2026 .   #Rash--resolved.  --Possibly related to antibiotic therapy --Patient is currently taking claritin daily. We will monitor his rash while he takes his prednisone  over the next few days.  -- Prescribed hydrocortisone  2.5% cream sent to his pharmacy.  #Right groin pain: --Pain has improved and well controlled with Norco 5-325 mg BID as needed.  #H/O DVT involving right  --Diagnosed 06/12/2022, secondary to underlying lymphoma --Currently on Eliquis  5 mg twice daily -- Patient completed greater than 6 months of full-strength anticoagulation therapy and his symptoms have resolved.  The provoking factor was thought to be the lymphadenopathy from his lymphoma. -- Okay to  discontinue Eliquis  at this time.  #Supportive Care -- port removed  -- no pain medication required at this time.   No orders of the defined types were placed in this encounter.  All questions were answered. The patient knows to call the clinic with any problems, questions or concerns.  I have spent a total of 30 minutes minutes of face-to-face and non-face-to-face time, preparing to see the patient,performing a medically appropriate examination, counseling and educating the patient, ordering tests/procedures, documenting clinical information in the electronic health record, independently interpreting results and communicating results to the patient, and care coordination.   Norleen IVAR Kidney, MD Department of Hematology/Oncology [facility] Phone: [phone] Pager: [phone] Email: [email or web address]  12/31/2024 3:59 PM  "

## 2024-12-31 ENCOUNTER — Encounter: Payer: Self-pay | Admitting: Hematology and Oncology

## 2024-12-31 ENCOUNTER — Inpatient Hospital Stay: Attending: Hematology and Oncology

## 2024-12-31 ENCOUNTER — Inpatient Hospital Stay: Admitting: Hematology and Oncology

## 2024-12-31 VITALS — BP 115/75 | HR 72 | Temp 97.7°F | Resp 14 | Wt 199.5 lb

## 2024-12-31 DIAGNOSIS — M47814 Spondylosis without myelopathy or radiculopathy, thoracic region: Secondary | ICD-10-CM | POA: Insufficient documentation

## 2024-12-31 DIAGNOSIS — I2510 Atherosclerotic heart disease of native coronary artery without angina pectoris: Secondary | ICD-10-CM | POA: Insufficient documentation

## 2024-12-31 DIAGNOSIS — Z7982 Long term (current) use of aspirin: Secondary | ICD-10-CM | POA: Insufficient documentation

## 2024-12-31 DIAGNOSIS — Z803 Family history of malignant neoplasm of breast: Secondary | ICD-10-CM | POA: Diagnosis not present

## 2024-12-31 DIAGNOSIS — Z79899 Other long term (current) drug therapy: Secondary | ICD-10-CM | POA: Insufficient documentation

## 2024-12-31 DIAGNOSIS — Z87891 Personal history of nicotine dependence: Secondary | ICD-10-CM | POA: Diagnosis not present

## 2024-12-31 DIAGNOSIS — Z8249 Family history of ischemic heart disease and other diseases of the circulatory system: Secondary | ICD-10-CM | POA: Insufficient documentation

## 2024-12-31 DIAGNOSIS — C8338 Diffuse large B-cell lymphoma, lymph nodes of multiple sites: Secondary | ICD-10-CM

## 2024-12-31 DIAGNOSIS — C8333 Diffuse large B-cell lymphoma, intra-abdominal lymph nodes: Secondary | ICD-10-CM | POA: Diagnosis present

## 2024-12-31 DIAGNOSIS — Z86718 Personal history of other venous thrombosis and embolism: Secondary | ICD-10-CM | POA: Diagnosis not present

## 2024-12-31 DIAGNOSIS — J439 Emphysema, unspecified: Secondary | ICD-10-CM | POA: Insufficient documentation

## 2024-12-31 DIAGNOSIS — I700 Atherosclerosis of aorta: Secondary | ICD-10-CM | POA: Diagnosis not present

## 2024-12-31 DIAGNOSIS — E785 Hyperlipidemia, unspecified: Secondary | ICD-10-CM | POA: Insufficient documentation

## 2024-12-31 DIAGNOSIS — Z7901 Long term (current) use of anticoagulants: Secondary | ICD-10-CM | POA: Insufficient documentation

## 2024-12-31 DIAGNOSIS — I252 Old myocardial infarction: Secondary | ICD-10-CM | POA: Insufficient documentation

## 2024-12-31 DIAGNOSIS — Z95828 Presence of other vascular implants and grafts: Secondary | ICD-10-CM | POA: Diagnosis not present

## 2024-12-31 LAB — CMP (CANCER CENTER ONLY)
ALT: 41 U/L (ref 0–44)
AST: 36 U/L (ref 15–41)
Albumin: 4.7 g/dL (ref 3.5–5.0)
Alkaline Phosphatase: 71 U/L (ref 38–126)
Anion gap: 13 (ref 5–15)
BUN: 18 mg/dL (ref 8–23)
CO2: 22 mmol/L (ref 22–32)
Calcium: 9.3 mg/dL (ref 8.9–10.3)
Chloride: 105 mmol/L (ref 98–111)
Creatinine: 0.71 mg/dL (ref 0.61–1.24)
GFR, Estimated: >60 mL/min
Glucose, Bld: 94 mg/dL (ref 70–99)
Potassium: 4.4 mmol/L (ref 3.5–5.1)
Sodium: 140 mmol/L (ref 135–145)
Total Bilirubin: 0.6 mg/dL (ref 0.0–1.2)
Total Protein: 7.9 g/dL (ref 6.5–8.1)

## 2024-12-31 LAB — CBC WITH DIFFERENTIAL (CANCER CENTER ONLY)
Abs Immature Granulocytes: 0.02 K/uL (ref 0.00–0.07)
Basophils Absolute: 0 K/uL (ref 0.0–0.1)
Basophils Relative: 0 %
Eosinophils Absolute: 0.2 K/uL (ref 0.0–0.5)
Eosinophils Relative: 3 %
HCT: 45.7 % (ref 39.0–52.0)
Hemoglobin: 15.6 g/dL (ref 13.0–17.0)
Immature Granulocytes: 0 %
Lymphocytes Relative: 17 %
Lymphs Abs: 0.8 K/uL (ref 0.7–4.0)
MCH: 32.3 pg (ref 26.0–34.0)
MCHC: 34.1 g/dL (ref 30.0–36.0)
MCV: 94.6 fL (ref 80.0–100.0)
Monocytes Absolute: 0.5 K/uL (ref 0.1–1.0)
Monocytes Relative: 10 %
Neutro Abs: 3.3 K/uL (ref 1.7–7.7)
Neutrophils Relative %: 70 %
Platelet Count: 157 K/uL (ref 150–400)
RBC: 4.83 MIL/uL (ref 4.22–5.81)
RDW: 12.4 % (ref 11.5–15.5)
WBC Count: 4.8 K/uL (ref 4.0–10.5)
nRBC: 0 % (ref 0.0–0.2)

## 2024-12-31 LAB — LACTATE DEHYDROGENASE: LDH: 239 U/L — ABNORMAL HIGH (ref 105–235)

## 2025-01-03 ENCOUNTER — Telehealth: Payer: Self-pay | Admitting: Hematology and Oncology

## 2025-01-03 NOTE — Telephone Encounter (Signed)
 I spoke to pt about scheduling appt. Pt has been made aware of new appt date and time.

## 2025-04-03 ENCOUNTER — Inpatient Hospital Stay

## 2025-04-03 ENCOUNTER — Inpatient Hospital Stay: Admitting: Hematology and Oncology
